# Patient Record
Sex: Female | Born: 1970
Health system: Southern US, Community
[De-identification: ages and names within clinical notes are randomized; demographics above are authoritative.]

## PROBLEM LIST (undated history)

## (undated) DIAGNOSIS — Z800 Family history of malignant neoplasm of digestive organs: Secondary | ICD-10-CM

## (undated) DIAGNOSIS — C801 Malignant (primary) neoplasm, unspecified: Secondary | ICD-10-CM

## (undated) DIAGNOSIS — F419 Anxiety disorder, unspecified: Secondary | ICD-10-CM

## (undated) DIAGNOSIS — E669 Obesity, unspecified: Secondary | ICD-10-CM

## (undated) HISTORY — DX: Obesity, unspecified: E66.9

## (undated) HISTORY — DX: Anxiety disorder, unspecified: F41.9

## (undated) HISTORY — PX: WISDOM TOOTH EXTRACTION: SHX21

## (undated) HISTORY — DX: Family history of malignant neoplasm of digestive organs: Z80.0

---

## 1998-04-30 ENCOUNTER — Other Ambulatory Visit: Admission: RE | Admit: 1998-04-30 | Discharge: 1998-04-30 | Payer: Self-pay | Admitting: Obstetrics & Gynecology

## 1998-12-24 ENCOUNTER — Encounter: Payer: Self-pay | Admitting: *Deleted

## 1998-12-24 ENCOUNTER — Ambulatory Visit (HOSPITAL_COMMUNITY): Admission: RE | Admit: 1998-12-24 | Discharge: 1998-12-24 | Payer: Self-pay | Admitting: *Deleted

## 1999-12-31 ENCOUNTER — Other Ambulatory Visit: Admission: RE | Admit: 1999-12-31 | Discharge: 1999-12-31 | Payer: Self-pay | Admitting: Obstetrics and Gynecology

## 2001-06-09 ENCOUNTER — Other Ambulatory Visit: Admission: RE | Admit: 2001-06-09 | Discharge: 2001-06-09 | Payer: Self-pay | Admitting: Obstetrics and Gynecology

## 2001-11-18 ENCOUNTER — Inpatient Hospital Stay (HOSPITAL_COMMUNITY): Admission: AD | Admit: 2001-11-18 | Discharge: 2001-11-18 | Payer: Self-pay | Admitting: Obstetrics and Gynecology

## 2001-11-20 ENCOUNTER — Inpatient Hospital Stay (HOSPITAL_COMMUNITY): Admission: AD | Admit: 2001-11-20 | Discharge: 2001-11-22 | Payer: Self-pay | Admitting: Obstetrics and Gynecology

## 2001-11-20 ENCOUNTER — Encounter: Payer: Self-pay | Admitting: Obstetrics and Gynecology

## 2001-12-07 ENCOUNTER — Inpatient Hospital Stay (HOSPITAL_COMMUNITY): Admission: AD | Admit: 2001-12-07 | Discharge: 2001-12-09 | Payer: Self-pay | Admitting: Obstetrics and Gynecology

## 2002-07-05 ENCOUNTER — Other Ambulatory Visit: Admission: RE | Admit: 2002-07-05 | Discharge: 2002-07-05 | Payer: Self-pay | Admitting: Obstetrics and Gynecology

## 2004-12-03 ENCOUNTER — Emergency Department (HOSPITAL_COMMUNITY): Admission: EM | Admit: 2004-12-03 | Discharge: 2004-12-03 | Payer: Self-pay | Admitting: Emergency Medicine

## 2005-03-25 ENCOUNTER — Other Ambulatory Visit: Admission: RE | Admit: 2005-03-25 | Discharge: 2005-03-25 | Payer: Self-pay | Admitting: Obstetrics and Gynecology

## 2005-04-29 ENCOUNTER — Inpatient Hospital Stay (HOSPITAL_COMMUNITY): Admission: AD | Admit: 2005-04-29 | Discharge: 2005-04-29 | Payer: Self-pay | Admitting: Obstetrics and Gynecology

## 2005-10-07 ENCOUNTER — Inpatient Hospital Stay (HOSPITAL_COMMUNITY): Admission: AD | Admit: 2005-10-07 | Discharge: 2005-10-07 | Payer: Self-pay | Admitting: Obstetrics and Gynecology

## 2005-10-12 ENCOUNTER — Inpatient Hospital Stay (HOSPITAL_COMMUNITY): Admission: AD | Admit: 2005-10-12 | Discharge: 2005-10-15 | Payer: Self-pay | Admitting: Obstetrics and Gynecology

## 2008-03-24 HISTORY — PX: CERVICAL POLYPECTOMY: SHX88

## 2009-01-17 HISTORY — PX: BARTHOLIN GLAND CYST EXCISION: SHX565

## 2009-03-14 ENCOUNTER — Encounter: Admission: RE | Admit: 2009-03-14 | Discharge: 2009-03-14 | Payer: Self-pay | Admitting: Obstetrics and Gynecology

## 2010-08-09 NOTE — H&P (Signed)
Miranda Howe, BETSILL                        ACCOUNT NO.:  [id number]   MEDICAL RECORD NO.:  [id number]                   PATIENT TYPE:  MAT   LOCATION:  [facility]                                 F[facility]ILITY:  [facility]   PHYSICIAN:  Janine Limbo, M.D.            DATE OF BIRTH:  May 11, 1970   DATE OF ADMISSION:  12/07/2001  DATE OF DISCHARGE:                                HISTORY & PHYSICAL   HISTORY OF PRESENT ILLNESS:  The patient is a 40 year old gravida 1, para 0  at 63 and 5/7 weeks who presented with uterine contractions every two  minutes for several hours.  She reports positive bloody show and positive  fetal movement.  She denies leaking of fluid.  Pregnancy has been remarkable  for pleurisy versus left lower lobe pneumonia approximately three weeks ago  requiring hospitalization, UTI in October 2002.   PRENATAL LABORATORIES:  Blood type O+.  Rh antibody negative.  VDRL  nonreactive.  Rubella titer positive.  Hepatitis B surface antigen negative.  HIV nonreactive.  GC and Chlamydia cultures were negative.  Pap was normal  except for yeast.  Glucose challenge was normal.  AFP was normal.  Hemoglobin upon entry into practice was 12.3.  It was 10.6 at 28 weeks.  EDC  of December 16, 2001 was established by last menstrual period and was in  agreement with ultrasound at approximately 18 weeks.  Group B Strep culture  was negative at 36 weeks.   HISTORY OF PRESENT PREGNANCY:  The patient entered care at approximately 13  weeks.  She had an AFP that was normal.  She had parvo titers done at  approximately 17-1/2 weeks secondary to a Fifth's disease exposure.  Parvo  titers were normal.  She had some cramping at 28 weeks.  She was seen in  maternity admissions at approximately 36-37 weeks with left chest pain.  It  was determined to be probably musculoskeletal in origin.  She had no fever  or any other complications.  Approximately two days later she presented with  worsening chest  pain and was diagnosed with pleurisy versus left lower lobe  pneumonia.  She was hospitalized for approximately two days.  Was given  antibiotics and pain medication and this did resolve.  She has subsequently  been doing very well.   OBSTETRICAL HISTORY:  The patient is a primigravida.   PAST MEDICAL HISTORY:  She had a UTI in October 2001 with E. coli.  This was  treated.  She has a history of fibrocystic breast changes, but had a normal  Mammogram in October 2002 and a normal ultrasound.   ALLERGIES:  SULFA which causes a rash.   FAMILY HISTORY:  Her father has heart disease.  Her great uncle has  emphysema.   GENETIC HISTORY:  Unremarkable.   SOCIAL HISTORY:  The patient is married to the father of the baby.  He is  involved and supportive.  His name is Ladana Chavero.  The patient is graduate  educated.  She is employed as an Tax inspector.  Her husband is also  college educated.  He is employed as a Advice worker.  She has been  followed by the certified nurse midwife service at [facility].  She  denies any alcohol, drug, or tobacco use during this pregnancy.   PHYSICAL EXAMINATION:  VITAL SIGNS:  Stable.  The patient is afebrile.  HEENT:  Within normal limits.  LUNGS:  Bilateral breath sounds are clear.  HEART:  Regular rate and rhythm without murmur.  BREASTS:  Soft and nontender.  ABDOMEN:  Fundal height is approximately 39 cm.  Estimated fetal weight is 7-  7.5 pounds.  Uterine contractions are every two to three minutes, moderate  to strong quality.  PELVIC:  Cervical examination:  5 cm, 80%, vertex at a 0 station with  bulging bag of water.  Fetal heart rate is reactive with no decelerations.  EXTREMITIES:  Deep tendon reflexes are 2+ without clonus.  There is a trace  edema noted.   IMPRESSION:  1. Intrauterine pregnancy at term.  2. Active labor.  3. Negative group B Strep.   PLAN:  1. Admit to birthing suite for consult with Dr. Marline Backbone as attending     physician.  2. Routine certified nurse midwife orders.  3. The patient plans possible IV pain medication with epidural p.r.n.     Renaldo Reel Emilee Hero, C.N.M.                   Janine Limbo, M.D.    VLL/MEDQ  D:  12/07/2001  T:  12/07/2001  Job:  [id number]

## 2010-08-09 NOTE — H&P (Signed)
NAMEQUIN, MCPHERSON            ACCOUNT NO.:  [id number]   MEDICAL RECORD NO.:  [id number]          PATIENT TYPE:  INP   LOCATION:  9169                          [facility]ILITY:  [facility]   PHYSICIAN:  Osborn Coho, M.D.   DATE OF BIRTH:  [birth year]   DATE OF ADMISSION:  10/12/2005  DATE OF DISCHARGE:                                HISTORY & PHYSICAL   HISTORY OF PRESENT ILLNESS:  Ms. Delira is a gravida 3, para 1-0-1-1 who  presents at [redacted] weeks gestation, EDD October 12, 2005 by 11-week ultrasound.  She presents for induction of labor as a multiparous with a favorable  cervix.  She reports positive fetal movement, no vaginal bleeding, no  rupture of membranes and is having uncomfortable irregular contractions.  She denies any headache, visual changes or epigastric pain.  Her pregnancy  has been followed by the C.N.M. service at [facility] and is remarkable for (1)  advanced maternal age, (2) increase Down syndrome risk on quad screen of 1  in 45, adjusted risk after ultrasound 1 in 198, the patient declined amnio.  Her pregnancy has been essentially unremarkable.  She has been size equal to  dates throughout, normotensive with no proteinuria.  Her initial prenatal  weight is 142, final prenatal weight 177 for a weight gain of 34 pounds.   PRENATAL LABORATORY WORK:  Hemoglobin and hematocrit 12.1 and 35.2,  platelets 223,000, blood type and Rh O positive, antibody screen negative,  VDRL nonreactive, rubella immune, hepatitis B surface antigen negative.  Pap  smear within normal limits.  GC and chlamydia negative.  CF testing  negative.  Quad screen shows 1 in 79 Down syndrome risk, adjusted Down  syndrome risk after ultrasound 1 in 198.  Amniocentesis discussed with the  patient, she declined.  At 28 weeks 1-hour glucose challenge within normal  limits.  Hemoglobin 10.8.  At 36 weeks culture of the vaginal tract is  negative for group B strep.   OBSTETRICAL HISTORY:  In 2003 the patient had a  normal spontaneous vaginal  delivery at term with the birth of a 6 pound 13 ounce female infant named  Elle.  In 2005 the patient had a first trimester SAB with no D&C and no  complications.  This is her third and current pregnancy.   MEDICAL HISTORY:  During her first pregnancy she did have pleurisy and was  hospitalized for antibiotics and had a spiral CT at that time.  This  pregnancy has been unremarkable.   FAMILY HISTORY:  The patient's mother with a history of chronic hypertension  and varicose veins.  The patient's father has prostate cancer.   GENETIC HISTORY:  The patient is age 39.  Otherwise, there is no family  history of familial or chromosomal disorders, children that were born with  birth defects or any that died in infancy.   ALLERGIES:  THE PATIENT HAS A MEDICATION ALLERGY TO SULFA.   HABITS:  She denies the use of tobacco, alcohol or illicit drugs.   SOCIAL HISTORY:  Ms. Skalsky is a 40 year old Caucasian married female.  Her  husband Alexzia Kasler  is involved and supportive.  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  They are Catholic  in their faith.   REVIEW OF SYSTEMS:  The patient is typical of one with a uterine pregnancy  at term.  She presents for induction of labor.   PHYSICAL EXAMINATION:  VITAL SIGNS:  Stable.  The patient is afebrile.  HEENT:  Unremarkable.  HEART:  Regular rate and rhythm.  CHEST:  Lungs are clear.  ABDOMEN:  Gravid in its contour.  Uterine fundus is noted to extend 39 cm  above the level of the pubic symphysis.  Leopold's maneuvers finds the  infant to be in a longitudinal lie, cephalic presentation and the estimated  fetal weight is 7 to 7-1/2 pounds.  Her abdomen is soft and nontender.  The  baseline of the fetal heart rate monitor is 140s with average long-term  variability, reactivity is present with no periodic changes.  The patient is  contracting mildly and irregularly.  CERVIX:   Digital exam of the cervix finds it to be 2 cm dilated, 70% effaced  with the cephalic presenting part at -2 station and membranes intact.  EXTREMITIES:  Extremities show no pathologic edema.  DTRs are 1+ with no  clonus.   ASSESSMENT:  1.  Intrauterine pregnancy at term.  2.  Induction of labor.   PLAN:  Admit per Dr. Osborn Coho.  Routine C.N.M. orders.  Philipp Deputy to  come and evaluate the patient and make plans for induction.  See orders as  written.      Rica Koyanagi, C.N.M.      Osborn Coho, M.D.  Electronically Signed    SDM/MEDQ  D:  10/12/2005  T:  10/13/2005  Job:  [id number]

## 2010-08-09 NOTE — Discharge Summary (Signed)
Miranda Howe, Miranda Howe                        ACCOUNT NO.:  [id number]   MEDICAL RECORD NO.:  [id number]                   PATIENT TYPE:  INP   LOCATION:  9179                                 [facility]ACILITY[facility]   PHYSICIAN:  Crist Fat. Rivard, M.D.              DATE OF BIRTH:  1971-03-13   DATE OF ADMISSION:  11/20/2001  DATE OF DISCHARGE:  11/22/2001                                 DISCHARGE SUMMARY   ADMISSION DIAGNOSES:  1. Intrauterine pregnancy at 33 and three-sevenths weeks.  2. Rib cage pain.  3. Questionable pneumonia versus allergic alveolitis.   DISCHARGE DIAGNOSES:  1. Intrauterine pregnancy 36 and five-sevenths weeks.  2. Left lower lobe pneumonia, improving.  3. Gastroesophageal reflux disease.   PROCEDURES:  1. Antibiotic therapy.  2. Spiral CT.   HOSPITAL COURSE:  The patient is a 40 year old gravida 1 para 0 at 22 and  three-sevenths weeks who presented on the morning of November 20, 2001 with  worsening left rib cage pain.  She was evaluated in maternity admissions  Thursday evening for same pain, was determined to probably have  musculoskeletal issues status post a persistent nonproductive cough, and  reflux.  She was prescribed Darvocet and Tussionex.  She did have some  benefit to these, but then pain began to increase Thursday p.m. into Friday  morning.  She was afebrile on presentation, her vital signs were stable.  She was evaluated with chest x-ray, CBC, lipase and amylase.  The chest x-  ray showed bibasilar subsegmental atelectasis.  She then had a spiral CT to  rule out PE.  The spiral CT was negative although there was some  questionable pneumonia versus allergic alveolitis.  Labs were within normal  limits.  She was admitted for presumed pneumonia versus costochondritis.  She was prescribed Zithromax, Rocephin, and codeine  NST was reactive with  negative CST.  She began to improve on antibiotics and pain management.  Amylase and lipase showed improved  and PPD was negative.  By September 1,  hospital day #2, the patient was doing well.  WBC count was down to 8.4, O2  saturation was 97% on room air.  Her pain was also much improved.  She was  having good fetal activity.  She was seen by Dr. Estanislado Pandy and the decision was  made to discharge her home secondary to left lower lobe pneumonia improving  and stable gastroesophageal reflux disease.   DISCHARGE MEDICATIONS:  1. Zithromax 250 mg for seven days.  2. Tussionex one teaspoon q.12h. p.r.n. cough.  3. Codeine 30 mg one to two p.o. q.3-4h p.r.n. pain.  4. Zantac 150 mg b.i.d. p.r.n.    DISCHARGE INSTRUCTIONS:  Discharge follow-up will occur as scheduled in the  office on November 25, 2001 with the patient having been given permission to  resume work four hours per day.  Precautions were reviewed with the patient.  She will call with  any worsening of pain, any onset of fever, any other  obstetrical issues, or shortness of breath.      Renaldo Reel Emilee Hero, C.N.M.                   Crist Fat Rivard, M.D.    Leeanne Mannan  D:  11/22/2001  T:  11/22/2001  Job:  [id number]

## 2010-08-09 NOTE — Discharge Summary (Signed)
NAMESHANTASIA, HUNNELL                        ACCOUNT NO.:  [id number]   MEDICAL RECORD NO.:  [id number]                   PATIENT TYPE:   LOCATION:                                       FACILITY:   PHYSICIAN:  Naima A. Dillard, M.D.              DATE OF BIRTH:  03-Apr-1970   DATE OF ADMISSION:  DATE OF DISCHARGE:                                 DISCHARGE SUMMARY   HISTORY OF PRESENT ILLNESS:  Patient is a 40 -year-old white female G1 P0 at  36-3/7 weeks, who came in this morning complaining of left rib cage pain  since her evaluation Thursday evening.  She was seen on Thursday evening for  a chronic nonproductive cough and rib cage pain.  She was given Darvocet and  Tussionex.  She awoke this morning with increased pain at that time.  She  has had a dry kind of productive cough for the last month.  She did have  some production about a month ago but this was clear sputum.  She has gotten  no relief with Robitussin either.  She denies any fevers or chills or nasal  congestion.  She denies having any shortness of breath, headaches, nausea or  vomiting, dizziness or syncope.  She denies any physical abuse.  Patient  does report having some heartburn and reflux for the last couple of months  which is awakens her at night.  She feels this in her throat which she  believes starts the coughing spell.  She just started Zantac yesterday.   Patient's chest x-ray is significant for bibasilar atelectasis today.  She  had an ABG with pH of 7.4, PO2 of 90, CO2 of 30 and pulse oximetry 97% on  room air.  Patient was then sent to rule out PE with spinal CT secondary to  her not getting any relief and pain.  The spinal CT was negative for  pulmonary embolus; however, this was significant for subtle ground glass  appearance in the lingular and left upper lobes which would represent  infection versus extrinsic allergic alveolitis.   PRENATAL CARE:  Patient does have good fetal movement.  No leakage  of fluid.  No vaginal bleeding.  Patient does not feel her contractions.  Her prenatal  care started at [facility] at 13 weeks'.  Her prenatal laboratories are O+ and  antibody negative.  Hemoglobin 12.8, RPR nonreactive, rubella immune, HIV is  nonreactive, one-hour Glucola was 64, AFP within normal limits, Pap was  normal.  GC and Chlamydia both negative.  GBS negative.   OBSTETRICAL HISTORY:  Unremarkable.   GYN HISTORY:  Significant for menarche at age 19 occurring every 28 days  lasting 5-7 days.  No history of any sexually transmitted diseases.   PAST MEDICAL HISTORY:  Patient denies any surgical or past medical  significant history.   ALLERGIES:  She is allergic to SULFA which causes a rash.  FAMILY HISTORY:  Significant for father with heart disease and great aunt  with emphysema.   SOCIAL HISTORY:  Negative x3.   REVIEW OF SYSTEMS:  CARDIOVASCULAR:  Unremarkable.  HEENT:  Unremarkable.  RESPIRATORY:  As above.  GENITOURINARY:  As above.  MUSCULOSKELETAL:  Unremarkable.   PHYSICAL EXAMINATION:  GENERAL:  Temperature 99.4, blood pressure 125/78,  pulse 96, respiratory rate 24.  Fetal heart tones 140's - 150's with some  acceleration.  Nonreactive.  She is contracting about every two to three  minutes.  She is in mild distress secondary to her left chest wall pain.  HEART:  Regular rate and rhythm.  LUNGS:  Clear to auscultation on the right.  She has some crackles in the  left base.  ABDOMEN:  Abdomen is soft, nontender, gravid.  She has some left chest wall  tenderness.  No CVA tenderness bilaterally.  EXTREMITIES:  No cyanosis, clubbing or edema.   LABORATORY DATA:  White blood count is 15.2 with left shift 84.7 segs, no  bandemia.  Amylase and lipase are pending.  Chest x-ray, ABG and CT are as  above.  Patient did have a UA when she was here before which was found to be  negative.   ASSESSMENT AND PLAN:  Patient is a [redacted] weeks pregnant woman with presumed  pneumonia  and costochondritis.  She will be admitted to OB-GYN service.  Patient will be started on Rocephin and Zithromax for antibiotics.  We will  encourage incentive spirometer.  She will be given codeine to suppress her  cough and for pain as needed.  She will also be given Tussionex as needed.  This could also be a result of an aspiration pneumonia due to her reflux so  she will be started on Zantac 150 mg p.o. b.i.d.  She will get small meals.  Patient given aspiration precautions and not allowed to lie down 30 minutes  after each meal.  Her NST is nonreactive, however, is reassuring with  negative CST.  We will continue continuous fetal monitoring.  She will have  pulse oximetry every shift.  If patient feels no improvement in the next 24-  48 hours, will obtain pulmonologist's consultation.  The patient is to be  monitored closely.                                                Naima A. Normand Sloop, M.D.    NAD/MEDQ  D:  11/20/2001  T:  11/20/2001  Job:  [id number]

## 2012-05-03 ENCOUNTER — Other Ambulatory Visit: Payer: Self-pay | Admitting: Obstetrics and Gynecology

## 2012-05-03 DIAGNOSIS — Z1231 Encounter for screening mammogram for malignant neoplasm of breast: Secondary | ICD-10-CM

## 2012-05-17 ENCOUNTER — Encounter: Payer: Self-pay | Admitting: Obstetrics and Gynecology

## 2012-05-17 ENCOUNTER — Ambulatory Visit: Payer: 59 | Admitting: Obstetrics and Gynecology

## 2012-05-17 VITALS — BP 100/62 | Temp 98.3°F | Ht 63.0 in | Wt 180.0 lb

## 2012-05-17 DIAGNOSIS — Z124 Encounter for screening for malignant neoplasm of cervix: Secondary | ICD-10-CM

## 2012-05-17 NOTE — Progress Notes (Signed)
Subjective:    Miranda Howe is a 42 y.o. female, [id number], who presents for an annual exam. The patient reports no complaints.  Menstrual cycle:   LMP: Patient's last menstrual period was 05/03/2012.             Review of Systems Pertinent items are noted in HPI. Denies pelvic pain, urinary tract symptoms, vaginitis symptoms, irregular bleeding, menopausal symptoms, change in bowel habits or rectal bleeding   Objective:    BP 100/62  Temp(Src) 98.3 F (36.8 C) (Oral)  Ht 5\' 3"  (1.6 m)  Wt 180 lb (81.647 kg)  BMI 31.89 kg/m2  LMP 05/03/2012   Wt Readings from Last 1 Encounters:  05/17/12 180 lb (81.647 kg)   Body mass index is 31.89 kg/(m^2). General Appearance: Alert, no acute distress HEENT: Grossly normal Neck / Thyroid: Supple, no thyromegaly or cervical adenopathy Lungs: Clear to auscultation bilaterally Back: No CVA tenderness Breast Exam: No masses or nodes.No dimpling, nipple retraction or discharge. Cardiovascular: Regular rate and rhythm.  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

## 2012-05-17 NOTE — Progress Notes (Addendum)
Regular Periods: yes Mammogram: yes  Monthly Breast Ex.: yes Exercise: yes  Tetanus < 10 years: yes Seatbelts: yes  NI. Bladder Functn.: yes Abuse at home: no  Daily BM's: yes Stressful Work: yes  Healthy Diet: yes Sigmoid-Colonoscopy: no  Calcium: no Medical problems this year: none   LAST PAP:3 years ago  Contraception: condoms and gel  Mammogram:  2 years. Pt schedule mammo in 3/14  PCP: Dr. Hyacinth Meeker  PMH: no change  FMH: no change  Last Bone Scan: no  Pt is married.

## 2012-05-19 ENCOUNTER — Encounter: Payer: Self-pay | Admitting: Obstetrics and Gynecology

## 2012-05-19 LAB — PAP IG W/ RFLX HPV ASCU

## 2012-05-31 ENCOUNTER — Ambulatory Visit
Admission: RE | Admit: 2012-05-31 | Discharge: 2012-05-31 | Disposition: A | Discharge status: Home / Self Care | Payer: 59 | Source: Ambulatory Visit | Attending: Obstetrics and Gynecology | Admitting: Obstetrics and Gynecology

## 2013-12-23 ENCOUNTER — Other Ambulatory Visit: Payer: Self-pay

## 2013-12-23 DIAGNOSIS — Z1231 Encounter for screening mammogram for malignant neoplasm of breast: Secondary | ICD-10-CM

## 2014-01-04 ENCOUNTER — Ambulatory Visit
Admission: RE | Admit: 2014-01-04 | Discharge: 2014-01-04 | Disposition: A | Discharge status: Home / Self Care | Payer: 59 | Source: Ambulatory Visit

## 2014-01-04 DIAGNOSIS — Z1231 Encounter for screening mammogram for malignant neoplasm of breast: Secondary | ICD-10-CM

## 2014-01-23 ENCOUNTER — Encounter: Payer: Self-pay | Admitting: Obstetrics and Gynecology

## 2014-06-06 ENCOUNTER — Emergency Department (HOSPITAL_COMMUNITY): Payer: 59

## 2014-06-06 ENCOUNTER — Inpatient Hospital Stay (HOSPITAL_COMMUNITY)
Admission: EM | Admit: 2014-06-06 | Discharge: 2014-06-15 | DRG: 853 | Disposition: A | Discharge status: Home / Self Care | Payer: 59 | Attending: Surgery | Admitting: Surgery

## 2014-06-06 ENCOUNTER — Encounter (HOSPITAL_COMMUNITY): Payer: Self-pay | Admitting: *Deleted

## 2014-06-06 DIAGNOSIS — R6521 Severe sepsis with septic shock: Secondary | ICD-10-CM | POA: Diagnosis present

## 2014-06-06 DIAGNOSIS — R001 Bradycardia, unspecified: Secondary | ICD-10-CM | POA: Diagnosis present

## 2014-06-06 DIAGNOSIS — A419 Sepsis, unspecified organism: Secondary | ICD-10-CM | POA: Diagnosis not present

## 2014-06-06 DIAGNOSIS — R52 Pain, unspecified: Secondary | ICD-10-CM

## 2014-06-06 DIAGNOSIS — D62 Acute posthemorrhagic anemia: Secondary | ICD-10-CM | POA: Diagnosis not present

## 2014-06-06 DIAGNOSIS — J811 Chronic pulmonary edema: Secondary | ICD-10-CM | POA: Diagnosis present

## 2014-06-06 DIAGNOSIS — Z79899 Other long term (current) drug therapy: Secondary | ICD-10-CM

## 2014-06-06 DIAGNOSIS — B372 Candidiasis of skin and nail: Secondary | ICD-10-CM | POA: Diagnosis present

## 2014-06-06 DIAGNOSIS — F419 Anxiety disorder, unspecified: Secondary | ICD-10-CM | POA: Diagnosis present

## 2014-06-06 DIAGNOSIS — Z833 Family history of diabetes mellitus: Secondary | ICD-10-CM

## 2014-06-06 DIAGNOSIS — Z8249 Family history of ischemic heart disease and other diseases of the circulatory system: Secondary | ICD-10-CM

## 2014-06-06 DIAGNOSIS — E860 Dehydration: Secondary | ICD-10-CM | POA: Diagnosis present

## 2014-06-06 DIAGNOSIS — R079 Chest pain, unspecified: Secondary | ICD-10-CM | POA: Diagnosis present

## 2014-06-06 DIAGNOSIS — J9690 Respiratory failure, unspecified, unspecified whether with hypoxia or hypercapnia: Secondary | ICD-10-CM

## 2014-06-06 DIAGNOSIS — Z791 Long term (current) use of non-steroidal anti-inflammatories (NSAID): Secondary | ICD-10-CM

## 2014-06-06 DIAGNOSIS — I9581 Postprocedural hypotension: Secondary | ICD-10-CM | POA: Diagnosis not present

## 2014-06-06 DIAGNOSIS — Z882 Allergy status to sulfonamides status: Secondary | ICD-10-CM

## 2014-06-06 DIAGNOSIS — J9811 Atelectasis: Secondary | ICD-10-CM | POA: Diagnosis present

## 2014-06-06 DIAGNOSIS — Z803 Family history of malignant neoplasm of breast: Secondary | ICD-10-CM

## 2014-06-06 DIAGNOSIS — E0781 Sick-euthyroid syndrome: Secondary | ICD-10-CM | POA: Diagnosis present

## 2014-06-06 DIAGNOSIS — Y838 Other surgical procedures as the cause of abnormal reaction of the patient, or of later complication, without mention of misadventure at the time of the procedure: Secondary | ICD-10-CM | POA: Diagnosis not present

## 2014-06-06 DIAGNOSIS — K567 Ileus, unspecified: Secondary | ICD-10-CM | POA: Diagnosis not present

## 2014-06-06 DIAGNOSIS — I959 Hypotension, unspecified: Secondary | ICD-10-CM | POA: Diagnosis present

## 2014-06-06 DIAGNOSIS — R1013 Epigastric pain: Secondary | ICD-10-CM | POA: Diagnosis not present

## 2014-06-06 DIAGNOSIS — J9589 Other postprocedural complications and disorders of respiratory system, not elsewhere classified: Secondary | ICD-10-CM | POA: Diagnosis not present

## 2014-06-06 DIAGNOSIS — C49A3 Gastrointestinal stromal tumor of small intestine: Secondary | ICD-10-CM | POA: Diagnosis present

## 2014-06-06 DIAGNOSIS — E039 Hypothyroidism, unspecified: Secondary | ICD-10-CM | POA: Diagnosis present

## 2014-06-06 LAB — I-STAT TROPONIN, ED: Troponin i, poc: 0 ng/mL (ref 0.00–0.08)

## 2014-06-06 NOTE — ED Notes (Signed)
Pt states that she began to have chest pain approx 2 hrs; pt describes the pain as pressure radiating to her back; pt with N/V; shortness of breath and diaphoresis; pt hyperventilating and is complaining of arm tingling and leg weakness

## 2014-06-07 ENCOUNTER — Inpatient Hospital Stay (HOSPITAL_COMMUNITY): Payer: 59 | Admitting: Anesthesiology

## 2014-06-07 ENCOUNTER — Encounter (HOSPITAL_COMMUNITY): Admission: EM | Disposition: A | Discharge status: Home / Self Care | Payer: Self-pay | Source: Home / Self Care

## 2014-06-07 ENCOUNTER — Emergency Department (HOSPITAL_COMMUNITY): Payer: 59

## 2014-06-07 ENCOUNTER — Ambulatory Visit: Admit: 2014-06-07 | Payer: Self-pay | Admitting: General Surgery

## 2014-06-07 ENCOUNTER — Inpatient Hospital Stay (HOSPITAL_COMMUNITY): Payer: 59

## 2014-06-07 ENCOUNTER — Encounter (HOSPITAL_COMMUNITY): Payer: Self-pay | Admitting: Emergency Medicine

## 2014-06-07 DIAGNOSIS — I9581 Postprocedural hypotension: Secondary | ICD-10-CM | POA: Diagnosis not present

## 2014-06-07 DIAGNOSIS — F419 Anxiety disorder, unspecified: Secondary | ICD-10-CM | POA: Diagnosis present

## 2014-06-07 DIAGNOSIS — I319 Disease of pericardium, unspecified: Secondary | ICD-10-CM | POA: Diagnosis not present

## 2014-06-07 DIAGNOSIS — R001 Bradycardia, unspecified: Secondary | ICD-10-CM | POA: Diagnosis present

## 2014-06-07 DIAGNOSIS — R6521 Severe sepsis with septic shock: Secondary | ICD-10-CM | POA: Diagnosis present

## 2014-06-07 DIAGNOSIS — J9589 Other postprocedural complications and disorders of respiratory system, not elsewhere classified: Secondary | ICD-10-CM | POA: Diagnosis not present

## 2014-06-07 DIAGNOSIS — R1904 Left lower quadrant abdominal swelling, mass and lump: Secondary | ICD-10-CM | POA: Diagnosis not present

## 2014-06-07 DIAGNOSIS — C179 Malignant neoplasm of small intestine, unspecified: Secondary | ICD-10-CM | POA: Diagnosis not present

## 2014-06-07 DIAGNOSIS — E0781 Sick-euthyroid syndrome: Secondary | ICD-10-CM | POA: Diagnosis present

## 2014-06-07 DIAGNOSIS — Y838 Other surgical procedures as the cause of abnormal reaction of the patient, or of later complication, without mention of misadventure at the time of the procedure: Secondary | ICD-10-CM | POA: Diagnosis not present

## 2014-06-07 DIAGNOSIS — Z791 Long term (current) use of non-steroidal anti-inflammatories (NSAID): Secondary | ICD-10-CM | POA: Diagnosis not present

## 2014-06-07 DIAGNOSIS — B372 Candidiasis of skin and nail: Secondary | ICD-10-CM | POA: Diagnosis present

## 2014-06-07 DIAGNOSIS — J811 Chronic pulmonary edema: Secondary | ICD-10-CM | POA: Diagnosis present

## 2014-06-07 DIAGNOSIS — E860 Dehydration: Secondary | ICD-10-CM | POA: Diagnosis present

## 2014-06-07 DIAGNOSIS — Z882 Allergy status to sulfonamides status: Secondary | ICD-10-CM | POA: Diagnosis not present

## 2014-06-07 DIAGNOSIS — I9589 Other hypotension: Secondary | ICD-10-CM | POA: Diagnosis not present

## 2014-06-07 DIAGNOSIS — Z79899 Other long term (current) drug therapy: Secondary | ICD-10-CM | POA: Diagnosis not present

## 2014-06-07 DIAGNOSIS — D62 Acute posthemorrhagic anemia: Secondary | ICD-10-CM | POA: Diagnosis not present

## 2014-06-07 DIAGNOSIS — E039 Hypothyroidism, unspecified: Secondary | ICD-10-CM | POA: Diagnosis present

## 2014-06-07 DIAGNOSIS — R1013 Epigastric pain: Secondary | ICD-10-CM | POA: Diagnosis present

## 2014-06-07 DIAGNOSIS — Z803 Family history of malignant neoplasm of breast: Secondary | ICD-10-CM | POA: Diagnosis not present

## 2014-06-07 DIAGNOSIS — Z833 Family history of diabetes mellitus: Secondary | ICD-10-CM | POA: Diagnosis not present

## 2014-06-07 DIAGNOSIS — K6389 Other specified diseases of intestine: Secondary | ICD-10-CM | POA: Diagnosis not present

## 2014-06-07 DIAGNOSIS — A419 Sepsis, unspecified organism: Secondary | ICD-10-CM | POA: Diagnosis present

## 2014-06-07 DIAGNOSIS — Z8249 Family history of ischemic heart disease and other diseases of the circulatory system: Secondary | ICD-10-CM | POA: Diagnosis not present

## 2014-06-07 DIAGNOSIS — K567 Ileus, unspecified: Secondary | ICD-10-CM | POA: Diagnosis not present

## 2014-06-07 DIAGNOSIS — R079 Chest pain, unspecified: Secondary | ICD-10-CM | POA: Diagnosis present

## 2014-06-07 DIAGNOSIS — J9811 Atelectasis: Secondary | ICD-10-CM | POA: Diagnosis not present

## 2014-06-07 HISTORY — PX: LAPAROTOMY: SHX154

## 2014-06-07 LAB — I-STAT CHEM 8, ED
BUN: 21 mg/dL (ref 6–23)
CALCIUM ION: 1.2 mmol/L (ref 1.12–1.23)
CREATININE: 0.9 mg/dL (ref 0.50–1.10)
Chloride: 102 mmol/L (ref 96–112)
Glucose, Bld: 106 mg/dL — ABNORMAL HIGH (ref 70–99)
HCT: 38 % (ref 36.0–46.0)
Hemoglobin: 12.9 g/dL (ref 12.0–15.0)
Potassium: 3.9 mmol/L (ref 3.5–5.1)
SODIUM: 140 mmol/L (ref 135–145)
TCO2: 20 mmol/L (ref 0–100)

## 2014-06-07 LAB — MRSA PCR SCREENING: MRSA BY PCR: NEGATIVE

## 2014-06-07 LAB — I-STAT TROPONIN, ED: TROPONIN I, POC: 0.03 ng/mL (ref 0.00–0.08)

## 2014-06-07 LAB — PROTIME-INR
INR: 1.06 (ref 0.00–1.49)
PROTHROMBIN TIME: 13.9 s (ref 11.6–15.2)

## 2014-06-07 LAB — CBC
HCT: 34.8 % — ABNORMAL LOW (ref 36.0–46.0)
Hemoglobin: 10.5 g/dL — ABNORMAL LOW (ref 12.0–15.0)
MCH: 22.6 pg — AB (ref 26.0–34.0)
MCHC: 30.2 g/dL (ref 30.0–36.0)
MCV: 75 fL — ABNORMAL LOW (ref 78.0–100.0)
PLATELETS: 334 10*3/uL (ref 150–400)
RBC: 4.64 MIL/uL (ref 3.87–5.11)
RDW: 16.4 % — ABNORMAL HIGH (ref 11.5–15.5)
WBC: 5.4 10*3/uL (ref 4.0–10.5)

## 2014-06-07 LAB — HEPATIC FUNCTION PANEL
ALT: 31 U/L (ref 0–35)
AST: 44 U/L — ABNORMAL HIGH (ref 0–37)
Albumin: 3.9 g/dL (ref 3.5–5.2)
Alkaline Phosphatase: 207 U/L — ABNORMAL HIGH (ref 39–117)
BILIRUBIN DIRECT: 0.2 mg/dL (ref 0.0–0.5)
BILIRUBIN INDIRECT: 0.4 mg/dL (ref 0.3–0.9)
Total Bilirubin: 0.6 mg/dL (ref 0.3–1.2)
Total Protein: 7.8 g/dL (ref 6.0–8.3)

## 2014-06-07 LAB — BASIC METABOLIC PANEL
Anion gap: 16 — ABNORMAL HIGH (ref 5–15)
BUN: 20 mg/dL (ref 6–23)
CALCIUM: 9.7 mg/dL (ref 8.4–10.5)
CO2: 21 mmol/L (ref 19–32)
Chloride: 102 mmol/L (ref 96–112)
Creatinine, Ser: 0.98 mg/dL (ref 0.50–1.10)
GFR calc Af Amer: 80 mL/min — ABNORMAL LOW (ref >90)
GFR, EST NON AFRICAN AMERICAN: 69 mL/min — AB (ref >90)
Glucose, Bld: 107 mg/dL — ABNORMAL HIGH (ref 70–99)
Potassium: 3.9 mmol/L (ref 3.5–5.1)
SODIUM: 139 mmol/L (ref 135–145)

## 2014-06-07 LAB — TYPE AND SCREEN
ABO/RH(D): O POS
ANTIBODY SCREEN: NEGATIVE

## 2014-06-07 LAB — I-STAT CG4 LACTIC ACID, ED
LACTIC ACID, VENOUS: 3.09 mmol/L — AB (ref 0.5–2.0)
Lactic Acid, Venous: 3.08 mmol/L (ref 0.5–2.0)

## 2014-06-07 LAB — ABO/RH: ABO/RH(D): O POS

## 2014-06-07 LAB — D-DIMER, QUANTITATIVE: D-Dimer, Quant: >20 ug/mL-FEU — ABNORMAL HIGH (ref 0.00–0.48)

## 2014-06-07 LAB — POC URINE PREG, ED: PREG TEST UR: NEGATIVE

## 2014-06-07 LAB — LIPASE, BLOOD: Lipase: 20 U/L (ref 11–59)

## 2014-06-07 LAB — POC OCCULT BLOOD, ED: Fecal Occult Bld: NEGATIVE

## 2014-06-07 SURGERY — LAPAROTOMY, EXPLORATORY
Anesthesia: General | Site: Abdomen

## 2014-06-07 MED ORDER — FENTANYL CITRATE 0.05 MG/ML IJ SOLN
50.0000 ug | Freq: Once | INTRAMUSCULAR | Status: AC
  Administered 2014-06-07: 50 ug via INTRAVENOUS
  Filled 2014-06-07: qty 2

## 2014-06-07 MED ORDER — LACTATED RINGERS IV SOLN
INTRAVENOUS | Status: DC | PRN
  Administered 2014-06-07 (×3): via INTRAVENOUS

## 2014-06-07 MED ORDER — LIDOCAINE HCL 2 % EX GEL
CUTANEOUS | Status: AC
  Filled 2014-06-07: qty 15

## 2014-06-07 MED ORDER — HYDROMORPHONE HCL 1 MG/ML IJ SOLN
INTRAMUSCULAR | Status: AC
  Filled 2014-06-07: qty 1

## 2014-06-07 MED ORDER — MENTHOL 3 MG MT LOZG
1.0000 | LOZENGE | OROMUCOSAL | Status: DC | PRN
  Filled 2014-06-07: qty 9

## 2014-06-07 MED ORDER — PROMETHAZINE HCL 25 MG/ML IJ SOLN: 6.2500 mg | INTRAMUSCULAR | Status: DC | PRN

## 2014-06-07 MED ORDER — ALBUMIN HUMAN 5 % IV SOLN
INTRAVENOUS | Status: DC | PRN
  Administered 2014-06-07 (×2): via INTRAVENOUS

## 2014-06-07 MED ORDER — LIP MEDEX EX OINT
1.0000 "application " | TOPICAL_OINTMENT | Freq: Two times a day (BID) | CUTANEOUS | Status: DC
  Administered 2014-06-07 – 2014-06-15 (×16): 1 via TOPICAL
  Filled 2014-06-07 (×3): qty 7

## 2014-06-07 MED ORDER — MIDAZOLAM HCL 2 MG/2ML IJ SOLN
INTRAMUSCULAR | Status: AC
  Filled 2014-06-07: qty 2

## 2014-06-07 MED ORDER — SODIUM CHLORIDE 0.9 % IV BOLUS (SEPSIS): 2000.0000 mL | Freq: Once | INTRAVENOUS | Status: DC

## 2014-06-07 MED ORDER — LIDOCAINE HCL (CARDIAC) 20 MG/ML IV SOLN
INTRAVENOUS | Status: DC | PRN
Start: 2014-06-07 — End: 2014-06-07
  Administered 2014-06-07: 100 mg via INTRAVENOUS

## 2014-06-07 MED ORDER — ALBUMIN HUMAN 5 % IV SOLN
INTRAVENOUS | Status: AC
  Filled 2014-06-07: qty 250

## 2014-06-07 MED ORDER — LORAZEPAM 2 MG/ML IJ SOLN: 0.5000 mg | Freq: Once | INTRAMUSCULAR | Status: DC

## 2014-06-07 MED ORDER — PHENYLEPHRINE HCL 10 MG/ML IJ SOLN
INTRAMUSCULAR | Status: AC
  Filled 2014-06-07: qty 2

## 2014-06-07 MED ORDER — ONDANSETRON HCL 4 MG/2ML IJ SOLN
INTRAMUSCULAR | Status: DC | PRN
  Administered 2014-06-07: 4 mg via INTRAVENOUS

## 2014-06-07 MED ORDER — LACTATED RINGERS IV BOLUS (SEPSIS): 1000.0000 mL | Freq: Three times a day (TID) | INTRAVENOUS | Status: DC | PRN

## 2014-06-07 MED ORDER — SODIUM CHLORIDE 0.9 % IV BOLUS (SEPSIS)
1000.0000 mL | Freq: Once | INTRAVENOUS | Status: AC
Start: 2014-06-07 — End: 2014-06-07
  Administered 2014-06-07: 1000 mL via INTRAVENOUS

## 2014-06-07 MED ORDER — ONDANSETRON HCL 4 MG/2ML IJ SOLN
4.0000 mg | Freq: Four times a day (QID) | INTRAMUSCULAR | Status: DC | PRN
  Administered 2014-06-09 – 2014-06-12 (×7): 4 mg via INTRAVENOUS
  Filled 2014-06-07 (×9): qty 2

## 2014-06-07 MED ORDER — SODIUM CHLORIDE 0.9 % IV BOLUS (SEPSIS)
2000.0000 mL | Freq: Once | INTRAVENOUS | Status: AC
Start: 2014-06-07 — End: 2014-06-07
  Administered 2014-06-07: 2000 mL via INTRAVENOUS

## 2014-06-07 MED ORDER — KETOROLAC TROMETHAMINE 30 MG/ML IJ SOLN
30.0000 mg | Freq: Once | INTRAMUSCULAR | Status: DC
Start: 2014-06-07 — End: 2014-06-07

## 2014-06-07 MED ORDER — LACTATED RINGERS IV SOLN
INTRAVENOUS | Status: DC
  Administered 2014-06-07: 18:00:00 via INTRAVENOUS

## 2014-06-07 MED ORDER — LACTATED RINGERS IV SOLN
INTRAVENOUS | Status: AC
  Administered 2014-06-08 (×2): via INTRAVENOUS

## 2014-06-07 MED ORDER — DEXTROSE 5 % IV SOLN
2.0000 g | INTRAVENOUS | Status: DC
  Administered 2014-06-07: 2 g via INTRAVENOUS
  Filled 2014-06-07: qty 2

## 2014-06-07 MED ORDER — KETAMINE HCL 10 MG/ML IJ SOLN
INTRAMUSCULAR | Status: DC | PRN
  Administered 2014-06-07: 20 mg via INTRAVENOUS
  Administered 2014-06-07: 30 mg via INTRAVENOUS
  Administered 2014-06-07: 10 mg via INTRAVENOUS

## 2014-06-07 MED ORDER — FUROSEMIDE 10 MG/ML IJ SOLN
INTRAMUSCULAR | Status: AC
  Filled 2014-06-07: qty 4

## 2014-06-07 MED ORDER — SUCCINYLCHOLINE CHLORIDE 20 MG/ML IJ SOLN
INTRAMUSCULAR | Status: DC | PRN
  Administered 2014-06-07: 100 mg via INTRAVENOUS

## 2014-06-07 MED ORDER — ZOLPIDEM TARTRATE 5 MG PO TABS: 5.0000 mg | ORAL_TABLET | Freq: Every evening | ORAL | Status: DC | PRN

## 2014-06-07 MED ORDER — KETAMINE HCL 10 MG/ML IJ SOLN
INTRAMUSCULAR | Status: AC
  Filled 2014-06-07: qty 1

## 2014-06-07 MED ORDER — SODIUM CHLORIDE 0.9 % IV BOLUS (SEPSIS)
500.0000 mL | Freq: Once | INTRAVENOUS | Status: AC
  Administered 2014-06-07: 500 mL via INTRAVENOUS

## 2014-06-07 MED ORDER — PHENYLEPHRINE 40 MCG/ML (10ML) SYRINGE FOR IV PUSH (FOR BLOOD PRESSURE SUPPORT)
PREFILLED_SYRINGE | INTRAVENOUS | Status: AC
  Filled 2014-06-07: qty 10

## 2014-06-07 MED ORDER — HYDROMORPHONE HCL 1 MG/ML IJ SOLN
0.5000 mg | INTRAMUSCULAR | Status: DC | PRN
  Administered 2014-06-07 – 2014-06-08 (×4): 1 mg via INTRAVENOUS
  Filled 2014-06-07: qty 2
  Filled 2014-06-07: qty 1
  Filled 2014-06-07: qty 2
  Filled 2014-06-07 (×2): qty 1

## 2014-06-07 MED ORDER — ADULT MULTIVITAMIN W/MINERALS CH: 1.0000 | ORAL_TABLET | Freq: Every day | ORAL | Status: DC

## 2014-06-07 MED ORDER — ACETAMINOPHEN 650 MG RE SUPP: 650.0000 mg | Freq: Four times a day (QID) | RECTAL | Status: DC | PRN

## 2014-06-07 MED ORDER — HEPARIN SODIUM (PORCINE) 5000 UNIT/ML IJ SOLN
5000.0000 [IU] | Freq: Three times a day (TID) | INTRAMUSCULAR | Status: DC
  Administered 2014-06-08 – 2014-06-15 (×22): 5000 [IU] via SUBCUTANEOUS
  Filled 2014-06-07 (×26): qty 1

## 2014-06-07 MED ORDER — MEPERIDINE HCL 50 MG/ML IJ SOLN: 6.2500 mg | INTRAMUSCULAR | Status: DC | PRN

## 2014-06-07 MED ORDER — ONDANSETRON HCL 4 MG/2ML IJ SOLN
4.0000 mg | Freq: Once | INTRAMUSCULAR | Status: AC
  Administered 2014-06-07: 4 mg via INTRAVENOUS
  Filled 2014-06-07: qty 2

## 2014-06-07 MED ORDER — 0.9 % SODIUM CHLORIDE (POUR BTL) OPTIME
TOPICAL | Status: DC | PRN
Start: 2014-06-07 — End: 2014-06-07
  Administered 2014-06-07: 6000 mL

## 2014-06-07 MED ORDER — STERILE WATER FOR IRRIGATION IR SOLN
Status: DC | PRN
  Administered 2014-06-07: 1500 mL

## 2014-06-07 MED ORDER — PIPERACILLIN-TAZOBACTAM 3.375 G IVPB
3.3750 g | Freq: Three times a day (TID) | INTRAVENOUS | Status: DC
  Administered 2014-06-07 – 2014-06-11 (×11): 3.375 g via INTRAVENOUS
  Filled 2014-06-07 (×12): qty 50

## 2014-06-07 MED ORDER — LACTATED RINGERS IV SOLN
INTRAVENOUS | Status: DC | PRN
  Administered 2014-06-07 (×4): via INTRAVENOUS

## 2014-06-07 MED ORDER — CHLORHEXIDINE GLUCONATE 4 % EX LIQD: 1.0000 "application " | Freq: Once | CUTANEOUS | Status: DC

## 2014-06-07 MED ORDER — PROMETHAZINE HCL 25 MG/ML IJ SOLN
6.2500 mg | INTRAMUSCULAR | Status: DC | PRN
  Filled 2014-06-07: qty 1

## 2014-06-07 MED ORDER — BISACODYL 10 MG RE SUPP: 10.0000 mg | Freq: Two times a day (BID) | RECTAL | Status: DC | PRN

## 2014-06-07 MED ORDER — NEOSTIGMINE METHYLSULFATE 10 MG/10ML IV SOLN
INTRAVENOUS | Status: DC | PRN
  Administered 2014-06-07: 4 mg via INTRAVENOUS

## 2014-06-07 MED ORDER — METRONIDAZOLE IN NACL 5-0.79 MG/ML-% IV SOLN
500.0000 mg | Freq: Four times a day (QID) | INTRAVENOUS | Status: DC
  Administered 2014-06-07 (×2): 500 mg via INTRAVENOUS
  Filled 2014-06-07: qty 100

## 2014-06-07 MED ORDER — SACCHAROMYCES BOULARDII 250 MG PO CAPS
250.0000 mg | ORAL_CAPSULE | Freq: Two times a day (BID) | ORAL | Status: DC
  Filled 2014-06-07 (×3): qty 1

## 2014-06-07 MED ORDER — HYDROMORPHONE HCL 1 MG/ML IJ SOLN
0.2500 mg | INTRAMUSCULAR | Status: DC | PRN
  Administered 2014-06-07: 0.25 mg via INTRAVENOUS

## 2014-06-07 MED ORDER — FUROSEMIDE 10 MG/ML IJ SOLN
40.0000 mg | Freq: Once | INTRAMUSCULAR | Status: AC
  Administered 2014-06-07: 40 mg via INTRAVENOUS

## 2014-06-07 MED ORDER — MIDAZOLAM HCL 5 MG/5ML IJ SOLN
INTRAMUSCULAR | Status: DC | PRN
  Administered 2014-06-07: 1 mg via INTRAVENOUS

## 2014-06-07 MED ORDER — CEFOTETAN DISODIUM 2 G IJ SOLR
2.0000 g | INTRAMUSCULAR | Status: AC
  Administered 2014-06-07: 2 g via INTRAVENOUS

## 2014-06-07 MED ORDER — CISATRACURIUM BESYLATE (PF) 10 MG/5ML IV SOLN
INTRAVENOUS | Status: DC | PRN
  Administered 2014-06-07: 2 mg via INTRAVENOUS
  Administered 2014-06-07: 4 mg via INTRAVENOUS
  Administered 2014-06-07: 10 mg via INTRAVENOUS

## 2014-06-07 MED ORDER — PHENOL 1.4 % MT LIQD: 2.0000 | OROMUCOSAL | Status: DC | PRN

## 2014-06-07 MED ORDER — LIP MEDEX EX OINT
TOPICAL_OINTMENT | CUTANEOUS | Status: AC
  Filled 2014-06-07: qty 7

## 2014-06-07 MED ORDER — CETYLPYRIDINIUM CHLORIDE 0.05 % MT LIQD
7.0000 mL | Freq: Two times a day (BID) | OROMUCOSAL | Status: DC
  Administered 2014-06-08 – 2014-06-13 (×11): 7 mL via OROMUCOSAL

## 2014-06-07 MED ORDER — ACETAMINOPHEN 325 MG PO TABS: 325.0000 mg | ORAL_TABLET | Freq: Four times a day (QID) | ORAL | Status: DC | PRN

## 2014-06-07 MED ORDER — PROPOFOL 10 MG/ML IV BOLUS
INTRAVENOUS | Status: AC
  Filled 2014-06-07: qty 20

## 2014-06-07 MED ORDER — CITALOPRAM HYDROBROMIDE 10 MG PO TABS
10.0000 mg | ORAL_TABLET | Freq: Every day | ORAL | Status: DC
  Filled 2014-06-07: qty 1

## 2014-06-07 MED ORDER — MAGIC MOUTHWASH
15.0000 mL | Freq: Four times a day (QID) | ORAL | Status: DC | PRN
  Administered 2014-06-14 (×3): 15 mL via ORAL
  Filled 2014-06-07 (×5): qty 15

## 2014-06-07 MED ORDER — GLYCOPYRROLATE 0.2 MG/ML IJ SOLN
INTRAMUSCULAR | Status: DC | PRN
  Administered 2014-06-07: .6 mg via INTRAVENOUS

## 2014-06-07 MED ORDER — KCL IN DEXTROSE-NACL 40-5-0.45 MEQ/L-%-% IV SOLN
INTRAVENOUS | Status: DC
  Administered 2014-06-07: 06:00:00 via INTRAVENOUS
  Filled 2014-06-07 (×2): qty 1000

## 2014-06-07 MED ORDER — ONDANSETRON HCL 4 MG/2ML IJ SOLN
INTRAMUSCULAR | Status: AC
  Filled 2014-06-07: qty 2

## 2014-06-07 MED ORDER — LIDOCAINE HCL (CARDIAC) 20 MG/ML IV SOLN
INTRAVENOUS | Status: AC
  Filled 2014-06-07: qty 5

## 2014-06-07 MED ORDER — DIPHENHYDRAMINE HCL 50 MG/ML IJ SOLN: 12.5000 mg | Freq: Four times a day (QID) | INTRAMUSCULAR | Status: DC | PRN

## 2014-06-07 MED ORDER — CEFOTETAN DISODIUM-DEXTROSE 2-2.08 GM-% IV SOLR
INTRAVENOUS | Status: AC
  Filled 2014-06-07: qty 50

## 2014-06-07 MED ORDER — ALUM & MAG HYDROXIDE-SIMETH 200-200-20 MG/5ML PO SUSP: 30.0000 mL | Freq: Four times a day (QID) | ORAL | Status: DC | PRN

## 2014-06-07 MED ORDER — LORAZEPAM 2 MG/ML IJ SOLN: 0.5000 mg | Freq: Three times a day (TID) | INTRAMUSCULAR | Status: DC | PRN

## 2014-06-07 MED ORDER — DEXAMETHASONE SODIUM PHOSPHATE 10 MG/ML IJ SOLN
INTRAMUSCULAR | Status: AC
  Filled 2014-06-07: qty 1

## 2014-06-07 MED ORDER — LACTATED RINGERS IV SOLN
INTRAVENOUS | Status: DC
  Administered 2014-06-07: 09:00:00 via INTRAVENOUS

## 2014-06-07 MED ORDER — PROPOFOL 10 MG/ML IV BOLUS
INTRAVENOUS | Status: DC | PRN
  Administered 2014-06-07: 50 mg via INTRAVENOUS

## 2014-06-07 MED ORDER — DEXAMETHASONE SODIUM PHOSPHATE 10 MG/ML IJ SOLN
INTRAMUSCULAR | Status: DC | PRN
  Administered 2014-06-07: 10 mg via INTRAVENOUS

## 2014-06-07 MED ORDER — ONDANSETRON 8 MG/NS 50 ML IVPB
8.0000 mg | Freq: Four times a day (QID) | INTRAVENOUS | Status: DC | PRN
Start: 2014-06-07 — End: 2014-06-15
  Filled 2014-06-07: qty 8

## 2014-06-07 MED ORDER — SUFENTANIL CITRATE 50 MCG/ML IV SOLN
INTRAVENOUS | Status: AC
  Filled 2014-06-07: qty 1

## 2014-06-07 MED ORDER — SODIUM CHLORIDE 0.9 % IJ SOLN
INTRAMUSCULAR | Status: AC
  Filled 2014-06-07: qty 10

## 2014-06-07 MED ORDER — METRONIDAZOLE IN NACL 5-0.79 MG/ML-% IV SOLN
INTRAVENOUS | Status: AC
  Filled 2014-06-07: qty 100

## 2014-06-07 MED ORDER — SUFENTANIL CITRATE 50 MCG/ML IV SOLN
INTRAVENOUS | Status: DC | PRN
  Administered 2014-06-07 (×5): 10 ug via INTRAVENOUS

## 2014-06-07 MED ORDER — CHLORHEXIDINE GLUCONATE 0.12 % MT SOLN
15.0000 mL | Freq: Two times a day (BID) | OROMUCOSAL | Status: DC
  Administered 2014-06-07 – 2014-06-15 (×14): 15 mL via OROMUCOSAL
  Filled 2014-06-07 (×18): qty 15

## 2014-06-07 MED ORDER — SODIUM CHLORIDE 0.9 % IJ SOLN
INTRAMUSCULAR | Status: AC
  Filled 2014-06-07: qty 3

## 2014-06-07 MED ORDER — SODIUM CHLORIDE 0.9 % IV SOLN
20.0000 mg | INTRAVENOUS | Status: DC | PRN
  Administered 2014-06-07: 80 ug/min via INTRAVENOUS

## 2014-06-07 MED ORDER — GI COCKTAIL ~~LOC~~: 30.0000 mL | Freq: Once | ORAL | Status: DC

## 2014-06-07 MED ORDER — IOHEXOL 350 MG/ML SOLN
100.0000 mL | Freq: Once | INTRAVENOUS | Status: AC | PRN
  Administered 2014-06-07: 100 mL via INTRAVENOUS

## 2014-06-07 MED ORDER — CISATRACURIUM BESYLATE 20 MG/10ML IV SOLN
INTRAVENOUS | Status: AC
  Filled 2014-06-07: qty 10

## 2014-06-07 SURGICAL SUPPLY — 35 items
APPLICATOR COTTON TIP 6IN STRL (MISCELLANEOUS) ×1 IMPLANT
BLADE EXTENDED COATED 6.5IN (ELECTRODE) ×2 IMPLANT
BLADE HEX COATED 2.75 (ELECTRODE) ×3 IMPLANT
COVER MAYO STAND STRL (DRAPES) ×2 IMPLANT
DRAPE LAPAROSCOPIC ABDOMINAL (DRAPES) ×3 IMPLANT
DRAPE WARM FLUID 44X44 (DRAPE) ×2 IMPLANT
DRSG OPSITE POSTOP 4X10 (GAUZE/BANDAGES/DRESSINGS) ×2 IMPLANT
ELECT LIGASURE SHORT 9 REUSE (ELECTRODE) ×2 IMPLANT
ELECT REM PT RETURN 9FT ADLT (ELECTROSURGICAL) ×3
ELECTRODE REM PT RTRN 9FT ADLT (ELECTROSURGICAL) ×1 IMPLANT
GAUZE SPONGE 4X4 12PLY STRL (GAUZE/BANDAGES/DRESSINGS) ×1 IMPLANT
GLOVE BIOGEL PI IND STRL 7.0 (GLOVE) ×1 IMPLANT
GLOVE BIOGEL PI INDICATOR 7.0 (GLOVE) ×2
GOWN STRL REUS W/TWL LRG LVL3 (GOWN DISPOSABLE) ×3 IMPLANT
GOWN STRL REUS W/TWL XL LVL3 (GOWN DISPOSABLE) ×10 IMPLANT
KIT BASIN OR (CUSTOM PROCEDURE TRAY) ×3 IMPLANT
NS IRRIG 1000ML POUR BTL (IV SOLUTION) ×3 IMPLANT
PACK GENERAL/GYN (CUSTOM PROCEDURE TRAY) ×3 IMPLANT
SPONGE LAP 18X18 X RAY DECT (DISPOSABLE) IMPLANT
STAPLER GUN LINEAR PROX 60 (STAPLE) ×2 IMPLANT
STAPLER VISISTAT 35W (STAPLE) ×3 IMPLANT
SUCTION POOLE TIP (SUCTIONS) ×2 IMPLANT
SUT PDS AB 1 CTX 36 (SUTURE) IMPLANT
SUT PDS AB 1 TP1 96 (SUTURE) ×4 IMPLANT
SUT SILK 2 0 (SUTURE) ×3
SUT SILK 2 0 SH CR/8 (SUTURE) ×2 IMPLANT
SUT SILK 2-0 18XBRD TIE 12 (SUTURE) IMPLANT
SUT SILK 3 0 (SUTURE) ×3
SUT SILK 3 0 SH CR/8 (SUTURE) ×2 IMPLANT
SUT SILK 3-0 18XBRD TIE 12 (SUTURE) IMPLANT
SWAB COLLECTION DEVICE MRSA (MISCELLANEOUS) ×2 IMPLANT
TOWEL OR 17X26 10 PK STRL BLUE (TOWEL DISPOSABLE) ×4 IMPLANT
TRAY FOLEY CATH 14FRSI W/METER (CATHETERS) ×2 IMPLANT
TUBE ANAEROBIC SPECIMEN COL (MISCELLANEOUS) ×2 IMPLANT
YANKAUER SUCT BULB TIP NO VENT (SUCTIONS) ×2 IMPLANT

## 2014-06-07 NOTE — ED Notes (Signed)
Please call pt's husband Merry Proud @ [phone] if pt is transferred from department.  Pt has set up password "[email or web address]" so husband may call and receive any PHI on pt.

## 2014-06-07 NOTE — ED Provider Notes (Addendum)
CSN: [id number]     Arrival date & time 06/06/14  2309 History   First MD Initiated Contact with Patient 06/07/14 0028     Chief Complaint  Patient presents with  . Chest Pain     (Consider location/radiation/quality/duration/timing/severity/associated sxs/prior Treatment) Patient is a 44 y.o. female presenting with chest pain. The history is provided by the patient.  Chest Pain Pain location:  Epigastric Pain quality: sharp   Pain severity:  Moderate Onset quality:  Sudden Timing:  Constant Progression:  Unchanged Chronicity:  New Context: stress   Relieved by:  Nothing Worsened by:  Nothing tried Ineffective treatments:  None tried Associated symptoms: anxiety   Associated symptoms: no abdominal pain, no diaphoresis, no fever, no lower extremity edema, no nausea, no shortness of breath and not vomiting   Risk factors: no aortic disease   Patient describes months of episodic vomiting for a few days at a time then gets better.  She thought it was GERD so she stopped eating after 7 pm. Then she had sudden onset epigastric and chest pain with neck and back radiation and tingling of the left arm and right leg this evening.  She admits to being under a lot of stress at work and feels anxious.  She is concerned the pain is breast cancer related as mom had BCA.   Past Medical History  Diagnosis Date  . Bartholin's duct cyst     Left; S/P I & D 01/17/2009  . H/O cervical polypectomy     2010  (benign)   Past Surgical History  Procedure Laterality Date  . Wisdom tooth extraction     Family History  Problem Relation Age of Onset  . Heart disease Father   . Hypertension Father   . Other Mother     varicose veins  . Cancer Mother   . Hypertension Mother   . Diabetes Mother   . Breast cancer Mother 59  . Lupus Cousin    History  Substance Use Topics  . Smoking status: Never Smoker   . Smokeless tobacco: Never Used  . Alcohol Use: Yes     Comment: socially   OB History     Gravida Para Term Preterm AB TAB SAB Ectopic Multiple Living   3 2   1  1   2      Review of Systems  Constitutional: Negative for fever and diaphoresis.  HENT: Positive for congestion.   Respiratory: Negative for chest tightness and shortness of breath.   Cardiovascular: Positive for chest pain. Negative for leg swelling.  Gastrointestinal: Negative for nausea, vomiting and abdominal pain.  All other systems reviewed and are negative.     Allergies  Sulfa antibiotics  Home Medications   Prior to Admission medications   Medication Sig Start Date End Date Taking? Authorizing Provider  citalopram (CELEXA) 10 MG tablet Take 10 mg by mouth daily.    Historical Provider, MD  Multiple Vitamin (MULTIVITAMIN) tablet Take 1 tablet by mouth daily.    Historical Provider, MD   BP 131/94 mmHg  Pulse 126  Temp(Src) 99.4 F (37.4 C) (Oral)  Resp 16  Ht 5\' 3"  (1.6 m)  Wt 165 lb (74.844 kg)  BMI 29.24 kg/m2  SpO2 100%  LMP 05/23/2014 Physical Exam  Constitutional: She is oriented to person, place, and time. She appears well-developed and well-nourished. No distress.  HENT:  Head: Normocephalic and atraumatic.  Mouth/Throat: Oropharynx is clear and moist.  Eyes: Conjunctivae are normal. Pupils are equal,  round, and reactive to light.  Neck: Normal range of motion. Neck supple.  Cardiovascular: Regular rhythm.  Tachycardia present.   Pulmonary/Chest: Effort normal and breath sounds normal. No respiratory distress. She has no wheezes. She has no rales.  Abdominal: Soft. Bowel sounds are normal. There is no tenderness. There is no rigidity, no rebound, no guarding, no tenderness at McBurney's point and negative Murphy's sign.  Musculoskeletal: Normal range of motion. She exhibits no edema or tenderness.  Neurological: She is alert and oriented to person, place, and time. She has normal reflexes.  Skin: Skin is warm and dry.  Psychiatric: Her mood appears anxious.    ED Course   Procedures (including critical care time) Labs Review Labs Reviewed  CBC - Abnormal; Notable for the following:    Hemoglobin 10.5 (*)    HCT 34.8 (*)    MCV 75.0 (*)    MCH 22.6 (*)    RDW 16.4 (*)    All other components within normal limits  BASIC METABOLIC PANEL - Abnormal; Notable for the following:    Glucose, Bld 107 (*)    GFR calc non Af Amer 69 (*)    GFR calc Af Amer 80 (*)    Anion gap 16 (*)    All other components within normal limits  I-STAT CHEM 8, ED - Abnormal; Notable for the following:    Glucose, Bld 106 (*)    All other components within normal limits  LIPASE, BLOOD  HEPATIC FUNCTION PANEL  I-STAT TROPOININ, ED    Imaging Review No results found.   EKG Interpretation   Date/Time:  Tuesday June 06 2014 23:21:13 EDT Ventricular Rate:  133 PR Interval:  120 QRS Duration: 79 QT Interval:  331 QTC Calculation: 492 R Axis:   82 Text Interpretation:  Sinus tachycardia Confirmed by [facility]  MD,  Deronda Christian ([id number]) on 06/07/2014 12:44:29 AM      MDM   Final diagnoses:  None    Results for orders placed or performed during the hospital encounter of 06/06/14  CBC  Result Value Ref Range   WBC 5.4 4.0 - 10.5 K/uL   RBC 4.64 3.87 - 5.11 MIL/uL   Hemoglobin 10.5 (L) 12.0 - 15.0 g/dL   HCT 34.8 (L) 36.0 - 46.0 %   MCV 75.0 (L) 78.0 - 100.0 fL   MCH 22.6 (L) 26.0 - 34.0 pg   MCHC 30.2 30.0 - 36.0 g/dL   RDW 16.4 (H) 11.5 - 15.5 %   Platelets 334 150 - 400 K/uL  Basic metabolic panel  Result Value Ref Range   Sodium 139 135 - 145 mmol/L   Potassium 3.9 3.5 - 5.1 mmol/L   Chloride 102 96 - 112 mmol/L   CO2 21 19 - 32 mmol/L   Glucose, Bld 107 (H) 70 - 99 mg/dL   BUN 20 6 - 23 mg/dL   Creatinine, Ser 0.98 0.50 - 1.10 mg/dL   Calcium 9.7 8.4 - 10.5 mg/dL   GFR calc non Af Amer 69 (L) >90 mL/min   GFR calc Af Amer 80 (L) >90 mL/min   Anion gap 16 (H) 5 - 15  Lipase, blood  Result Value Ref Range   Lipase 20 11 - 59 U/L  Hepatic function  panel  Result Value Ref Range   Total Protein 7.8 6.0 - 8.3 g/dL   Albumin 3.9 3.5 - 5.2 g/dL   AST 44 (H) 0 - 37 U/L   ALT 31 0 - 35  U/L   Alkaline Phosphatase 207 (H) 39 - 117 U/L   Total Bilirubin 0.6 0.3 - 1.2 mg/dL   Bilirubin, Direct 0.2 0.0 - 0.5 mg/dL   Indirect Bilirubin 0.4 0.3 - 0.9 mg/dL  D-dimer, quantitative  Result Value Ref Range   D-Dimer, Quant >20.00 (H) 0.00 - 0.48 ug/mL-FEU  I-stat troponin, ED (not at [facility])  Result Value Ref Range   Troponin i, poc 0.00 0.00 - 0.08 ng/mL   Comment 3          I-Stat Chem 8, ED  Result Value Ref Range   Sodium 140 135 - 145 mmol/L   Potassium 3.9 3.5 - 5.1 mmol/L   Chloride 102 96 - 112 mmol/L   BUN 21 6 - 23 mg/dL   Creatinine, Ser 0.90 0.50 - 1.10 mg/dL   Glucose, Bld 106 (H) 70 - 99 mg/dL   Calcium, Ion 1.20 1.12 - 1.23 mmol/L   TCO2 20 0 - 100 mmol/L   Hemoglobin 12.9 12.0 - 15.0 g/dL   HCT 38.0 36.0 - 46.0 %  POC urine preg, ED (not at [facility])  Result Value Ref Range   Preg Test, Ur NEGATIVE NEGATIVE  I-stat troponin, ED  Result Value Ref Range   Troponin i, poc 0.03 0.00 - 0.08 ng/mL   Comment 3          I-Stat CG4 Lactic Acid, ED  Result Value Ref Range   Lactic Acid, Venous 3.09 (HH) 0.5 - 2.0 mmol/L   Comment NOTIFIED PHYSICIAN   POC occult blood, ED  Result Value Ref Range   Fecal Occult Bld NEGATIVE NEGATIVE  Type and screen  Result Value Ref Range   ABO/RH(D) O POS    Antibody Screen NEG    Sample Expiration 06/10/2014    Dg Chest Port 1 View  06/07/2014   CLINICAL DATA:  Acute onset of chest pain and shortness of breath. Back pain for 1 day. Initial encounter.  EXAM: PORTABLE CHEST - 1 VIEW  COMPARISON:  None.  FINDINGS: The lungs are well-aerated. Pulmonary vascularity is at the upper limits of normal. There is no evidence of focal opacification, pleural effusion or pneumothorax.  The cardiomediastinal silhouette is within normal limits. No acute osseous abnormalities are seen.  IMPRESSION: No acute  cardiopulmonary process seen.   Electronically Signed   By: Garald Balding M.D.   On: 06/07/2014 01:20   Ct Angio Chest Aorta W/cm &/or Wo/cm  06/07/2014   CLINICAL DATA:  Acute onset of chest pain for 2 hours, radiating to the back. Nausea, vomiting, shortness of breath and diaphoresis. Elevated D-dimer. Arm tingling and leg weakness. Initial encounter.  EXAM: CT ANGIOGRAPHY CHEST, ABDOMEN AND PELVIS  TECHNIQUE: Multidetector CT imaging through the chest, abdomen and pelvis was performed using the standard protocol during bolus administration of intravenous contrast. Multiplanar reconstructed images and MIPs were obtained and reviewed to evaluate the vascular anatomy.  CONTRAST:  156mL OMNIPAQUE IOHEXOL 350 MG/ML SOLN  COMPARISON:  None.  FINDINGS: CTA CHEST FINDINGS  There is no evidence of aortic dissection. There is no evidence of aneurysmal dilatation. No calcific atherosclerotic disease is seen. The great vessels are unremarkable in appearance.  There is no evidence of pulmonary embolus.  Minimal bibasilar atelectasis is noted. Minimal nodularity at the lung apices likely also reflects atelectasis, though minimal infection might have a similar appearance. There is no evidence of pleural effusion or pneumothorax. No masses are identified; no abnormal focal contrast enhancement is seen.  The mediastinum is unremarkable in appearance. A tiny calcified node is seen adjacent to the distal esophagus. There is question of mild distal esophageal wall thickening, which could reflect minimal esophagitis, or could remain within normal limits. No pericardial effusion is identified. No axillary lymphadenopathy is seen. The visualized portions of the thyroid gland are unremarkable in appearance.  Mild nodularity to the left fibroglandular tissue is thought to remain within normal limits. No suspicious soft tissue masses are seen within the chest.  No acute osseous abnormalities are seen.  Review of the MIP images  confirms the above findings.  CTA ABDOMEN AND PELVIS FINDINGS  There is no evidence of aortic dissection. There is no evidence of aneurysmal dilatation. No calcific atherosclerotic disease is seen. Incidental note is made of a circumaortic left renal vein. The celiac trunk, superior mesenteric artery, bilateral renal arteries and inferior mesenteric artery appear patent. The inferior vena cava is unremarkable in appearance.  There is a large complex mass at the left lower quadrant. It measures approximately 9.3 x 8.4 x 7.5 cm, with a large amount of fluid and air noted centrally, and a diffusely irregular peripheral soft tissue border. Mild surrounding soft tissue inflammation is seen. This is suggested to be contiguous with the adjacent mid jejunum.  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 Jejunal adenocarcinoma can mimic this appearance, and metastatic disease as might be seen with melanoma can also have such an appearance, though there are no additional findings seen to suggest melanoma.  A centrally necrotic tumor is thought to be less likely, given the relatively typical appearance for small bowel lymphoma. A walled-off abscess is also very unlikely, given recruitment of vasculature surrounding the mass, adjacent mild nodularity overlying the descending colon, and a few prominent associated mesenteric nodes measuring up to 1.0 cm in size (best seen on coronal images).  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 However, the cecum and appendix are still noted on the right side.  There is a somewhat heterogeneous appearance to hepatic enhancement, which may reflect underlying fatty infiltration or mild venous congestion. No definite focal mass is seen. The spleen is unremarkable in appearance. The gallbladder is within normal limits. Hepatic hilar nodes remain normal in  size. The pancreas and adrenal glands are unremarkable.  A 1.0 cm cyst is noted at the upper pole of the right kidney. The kidneys are otherwise unremarkable. There is no evidence of hydronephrosis. No renal or ureteral stones are seen. No perinephric stranding is appreciated.  Trace fluid within the pelvis may be physiologic in nature, or could be related to the left lower quadrant abdominal mass. The stomach is within normal limits.  The appendix is normal in caliber, without evidence for appendicitis. The colon is partially filled with stool and is unremarkable in appearance, aside from mild adjacent nodularity along the descending colon as described above, at the level of the mass.  The bladder is mildly distended and grossly unremarkable. The uterus has a somewhat heterogeneous appearance, but remains normal in size. This may reflect multiple tiny fibroids. The ovaries are relatively symmetric. No suspicious adnexal masses are seen. No inguinal lymphadenopathy is seen.  No acute osseous abnormalities are identified.  Review of the MIP images confirms the above findings.  IMPRESSION: 1. No evidence of aortic dissection. No evidence of aneurysmal dilatation. No calcific atherosclerotic disease seen. 2. Large complex 9.3  x 8.4 x 7.5 cm mass noted at the left lower quadrant. It contains a large amount of fluid and air, with a diffusely irregular peripheral soft tissue or. Mild surrounding soft tissue inflammation seen. This is suggested to be contiguous with the adjacent mid ileum. 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 Jejunal adenocarcinoma can mimic this appearance, and metastatic disease as might be seen with melanoma can also have such an appearance, though there are no additional findings to suggest melanoma. 3. There is recruitment of vasculature about the mass, mild nodularity overlying the adjacent descending colon, and a  few prominent associated mesenteric nodes. These findings are highly suggestive of malignancy. 4. 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 However, the cecum and appendix are still seen on the right side. 5. Minimal nodularity at the lung apices likely reflects atelectasis, though minimal infection might have a similar appearance. Minimal bibasilar atelectasis noted. 6. Suggestion of mild distal esophageal wall thickening, which could reflect minimal esophagitis, or could remain within normal limits. 7. Circumaortic left renal vein incidentally noted. 8. Somewhat heterogeneous appearance to hepatic enhancement. This may reflect underlying fatty infiltration or mild venous congestion. No focal mass seen. 9. Small right renal cyst noted. 10. Trace free fluid within the pelvis may be physiologic in nature, or could be related to the left lower quadrant abdominal mass. 11. Heterogeneous appearance to the uterus may reflect multiple tiny fibroids.  These results were called by telephone at the time of interpretation on 06/07/2014 at 2:58 am to Dr. Veatrice Kells, who verbally acknowledged these results.   Electronically Signed   By: Garald Balding M.D.   On: 06/07/2014 03:55   Ct Angio Abd/pel W/ And/or W/o  06/07/2014   CLINICAL DATA:  Acute onset of chest pain for 2 hours, radiating to the back. Nausea, vomiting, shortness of breath and diaphoresis. Elevated D-dimer. Arm tingling and leg weakness. Initial encounter.  EXAM: CT ANGIOGRAPHY CHEST, ABDOMEN AND PELVIS  TECHNIQUE: Multidetector CT imaging through the chest, abdomen and pelvis was performed using the standard protocol during bolus administration of intravenous contrast. Multiplanar reconstructed images and MIPs were obtained and reviewed to evaluate the vascular anatomy.  CONTRAST:  173mL OMNIPAQUE IOHEXOL 350 MG/ML SOLN  COMPARISON:  None.  FINDINGS: CTA CHEST FINDINGS  There is no  evidence of aortic dissection. There is no evidence of aneurysmal dilatation. No calcific atherosclerotic disease is seen. The great vessels are unremarkable in appearance.  There is no evidence of pulmonary embolus.  Minimal bibasilar atelectasis is noted. Minimal nodularity at the lung apices likely also reflects atelectasis, though minimal infection might have a similar appearance. There is no evidence of pleural effusion or pneumothorax. No masses are identified; no abnormal focal contrast enhancement is seen.  The mediastinum is unremarkable in appearance. A tiny calcified node is seen adjacent to the distal esophagus. There is question of mild distal esophageal wall thickening, which could reflect minimal esophagitis, or could remain within normal limits. No pericardial effusion is identified. No axillary lymphadenopathy is seen. The visualized portions of the thyroid gland are unremarkable in appearance.  Mild nodularity to the left fibroglandular tissue is thought to remain within normal limits. No suspicious soft tissue masses are seen within the chest.  No acute osseous abnormalities are seen.  Review of the MIP images confirms the above findings.  CTA ABDOMEN AND PELVIS  FINDINGS  There is no evidence of aortic dissection. There is no evidence of aneurysmal dilatation. No calcific atherosclerotic disease is seen. Incidental note is made of a circumaortic left renal vein. The celiac trunk, superior mesenteric artery, bilateral renal arteries and inferior mesenteric artery appear patent. The inferior vena cava is unremarkable in appearance.  There is a large complex mass at the left lower quadrant. It measures approximately 9.3 x 8.4 x 7.5 cm, with a large amount of fluid and air noted centrally, and a diffusely irregular peripheral soft tissue border. Mild surrounding soft tissue inflammation is seen. This is suggested to be contiguous with the adjacent mid jejunum.  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 Jejunal adenocarcinoma can mimic this appearance, and metastatic disease as might be seen with melanoma can also have such an appearance, though there are no additional findings seen to suggest melanoma.  A centrally necrotic tumor is thought to be less likely, given the relatively typical appearance for small bowel lymphoma. A walled-off abscess is also very unlikely, given recruitment of vasculature surrounding the mass, adjacent mild nodularity overlying the descending colon, and a few prominent associated mesenteric nodes measuring up to 1.0 cm in size (best seen on coronal images).  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 However, the cecum and appendix are still noted on the right side.  There is a somewhat heterogeneous appearance to hepatic enhancement, which may reflect underlying fatty infiltration or mild venous congestion. No definite focal mass is seen. The spleen is unremarkable in appearance. The gallbladder is within normal limits. Hepatic hilar nodes remain normal in size. The pancreas and adrenal glands are unremarkable.  A 1.0 cm cyst is noted at the upper pole of the right kidney. The kidneys are otherwise unremarkable. There is no evidence of hydronephrosis. No renal or ureteral stones are seen. No perinephric stranding is appreciated.  Trace fluid within the pelvis may be physiologic in nature, or could be related to the left lower quadrant abdominal mass. The stomach is within normal limits.  The appendix is normal in caliber, without evidence for appendicitis. The colon is partially filled with stool and is unremarkable in appearance, aside from mild adjacent nodularity along the descending colon as described above, at the level of the mass.  The bladder is mildly distended and grossly unremarkable. The uterus has a somewhat heterogeneous  appearance, but remains normal in size. This may reflect multiple tiny fibroids. The ovaries are relatively symmetric. No suspicious adnexal masses are seen. No inguinal lymphadenopathy is seen.  No acute osseous abnormalities are identified.  Review of the MIP images confirms the above findings.  IMPRESSION: 1. No evidence of aortic dissection. No evidence of aneurysmal dilatation. No calcific atherosclerotic disease seen. 2. Large complex 9.3 x 8.4 x 7.5 cm mass noted at the left lower quadrant. It contains a large amount of fluid and air, with a diffusely irregular peripheral soft tissue or. Mild surrounding soft tissue inflammation seen. This is suggested to be contiguous with the adjacent mid ileum. 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 Jejunal adenocarcinoma can mimic this appearance, and metastatic disease as might be seen with melanoma can also have such an appearance, though there are no additional findings to suggest melanoma. 3. There is recruitment of vasculature about the mass, mild  nodularity overlying the adjacent descending colon, and a few prominent associated mesenteric nodes. These findings are highly suggestive of malignancy. 4. 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 However, the cecum and appendix are still seen on the right side. 5. Minimal nodularity at the lung apices likely reflects atelectasis, though minimal infection might have a similar appearance. Minimal bibasilar atelectasis noted. 6. Suggestion of mild distal esophageal wall thickening, which could reflect minimal esophagitis, or could remain within normal limits. 7. Circumaortic left renal vein incidentally noted. 8. Somewhat heterogeneous appearance to hepatic enhancement. This may reflect underlying fatty infiltration or mild venous congestion. No focal mass seen. 9. Small  right renal cyst noted. 10. Trace free fluid within the pelvis may be physiologic in nature, or could be related to the left lower quadrant abdominal mass. 11. Heterogeneous appearance to the uterus may reflect multiple tiny fibroids.  These results were called by telephone at the time of interpretation on 06/07/2014 at 2:58 am to Dr. Veatrice Kells, who verbally acknowledged these results.   Electronically Signed   By: Garald Balding M.D.   On: 06/07/2014 03:55   Medications  gi cocktail (Maalox,Lidocaine,Donnatal) (not administered)  dextrose 5 % and 0.45 % NaCl with KCl 40 mEq/L infusion (not administered)  acetaminophen (TYLENOL) suppository 650 mg (not administered)  acetaminophen (TYLENOL) tablet 325-650 mg (not administered)  HYDROmorphone (DILAUDID) injection 0.5-2 mg (not administered)  alum & mag hydroxide-simeth (MAALOX/MYLANTA) 200-200-20 MG/5ML suspension 30 mL (not administered)  lip balm (CARMEX) ointment 1 application (not administered)  magic mouthwash (not administered)  menthol-cetylpyridinium (CEPACOL) lozenge 3 mg (not administered)  phenol (CHLORASEPTIC) mouth spray 2 spray (not administered)  bisacodyl (DULCOLAX) suppository 10 mg (not administered)  saccharomyces boulardii (FLORASTOR) capsule 250 mg (not administered)  ondansetron (ZOFRAN) injection 4 mg (not administered)    Or  ondansetron (ZOFRAN) 8 mg/NS 50 ml IVPB (not administered)  promethazine (PHENERGAN) injection 6.25-12.5 mg (not administered)  diphenhydrAMINE (BENADRYL) injection 12.5-25 mg (not administered)  zolpidem (AMBIEN) tablet 5 mg (not administered)  lactated ringers bolus 1,000 mL (not administered)  LORazepam (ATIVAN) injection 0.5-1 mg (not administered)  citalopram (CELEXA) tablet 10 mg (not administered)  multivitamin with minerals tablet 1 tablet (not administered)  heparin injection 5,000 Units (not administered)  chlorhexidine (HIBICLENS) 4 % liquid 1 application (not administered)   chlorhexidine (HIBICLENS) 4 % liquid 1 application (not administered)  cefoTEtan (CEFOTAN) 2 g in dextrose 5 % 50 mL IVPB (not administered)  cefTRIAXone (ROCEPHIN) 2 g in dextrose 5 % 50 mL IVPB (0 g Intravenous Stopped 06/07/14 0531)  metroNIDAZOLE (FLAGYL) IVPB 500 mg (not administered)  ondansetron (ZOFRAN) injection 4 mg (4 mg Intravenous Given 06/07/14 0107)  sodium chloride 0.9 % bolus 2,000 mL (0 mLs Intravenous Stopped 06/07/14 0444)  iohexol (OMNIPAQUE) 350 MG/ML injection 100 mL (100 mLs Intravenous Contrast Given 06/07/14 0242)  sodium chloride 0.9 % bolus 1,000 mL (1,000 mLs Intravenous New Bag/Given 06/07/14 0342)  fentaNYL (SUBLIMAZE) injection 50 mcg (50 mcg Intravenous Given 06/07/14 0345)     MDM Reviewed: nursing note and vitals Interpretation: labs and ECG (slight anemia elevated lactate and ddimer.  Negative CXR by me) Consults: general surgery (radiology)    CRITICAL CARE Performed by: Carlisle Beers Total critical care time: 90 minutes Critical care time was exclusive of separately billable procedures and treating other patients. Critical care was necessary to treat or prevent imminent or life-threatening deterioration. Critical care was time spent  personally by me on the following activities: development of treatment plan with patient and/or surrogate as well as nursing, discussions with consultants, evaluation of patient's response to treatment, examination of patient, obtaining history from patient or surrogate, ordering and performing treatments and interventions, ordering and review of laboratory studies, ordering and review of radiographic studies, pulse oximetry and re-evaluation of patient's condition.     Veatrice Kells, MD 06/07/14 [phone]

## 2014-06-07 NOTE — ED Notes (Signed)
EDP and Dr. Johney Maine are both aware of pt's low BP.  Fluids continue to be infused.

## 2014-06-07 NOTE — ED Notes (Signed)
MD Palumbo at bedside.

## 2014-06-07 NOTE — ED Notes (Addendum)
Pt is extremely anxious and has been under more stress than usual.  Pt is exhibiting classic panic attack sx. Emotional support provided. ST on the monitor. Will continue to monitor.

## 2014-06-07 NOTE — Anesthesia Postprocedure Evaluation (Signed)
  Anesthesia Post-op Note  Patient: Miranda Howe  Procedure(s) Performed: Procedure(s) (LRB): EXPLORATORY LAPAROTOMY RESECTION OF ABDOMINAL MASS AND PORTION OF SMALL BOWEL (N/A)  Patient Location: PACU  Anesthesia Type: General  Level of Consciousness: awake and alert   Airway and Oxygen Therapy: Patient Spontanous Breathing  Post-op Pain: mild  Post-op Assessment: Post-op Vital signs reviewed, Patient's Cardiovascular Status Stable, Respiratory Function Stable, Patent Airway and No signs of Nausea or vomiting  Last Vitals:  Filed Vitals:   06/07/14 1645  BP: 102/65  Pulse: 80  Temp:   Resp: 25    Post-op Vital Signs: stable   Complications: No apparent anesthesia complications

## 2014-06-07 NOTE — Anesthesia Preprocedure Evaluation (Signed)
Anesthesia Evaluation  Patient identified by MRN, date of birth, ID band Patient awake    Reviewed: Allergy & Precautions, NPO status , Patient's Chart, lab work & pertinent test results  Airway Mallampati: II  TM Distance: >3 FB Neck ROM: Full    Dental no notable dental hx.    Pulmonary neg pulmonary ROS,  breath sounds clear to auscultation  Pulmonary exam normal       Cardiovascular negative cardio ROS  Rhythm:Regular Rate:Normal     Neuro/Psych negative neurological ROS  negative psych ROS   GI/Hepatic negative GI ROS, Neg liver ROS,   Endo/Other  negative endocrine ROS  Renal/GU negative Renal ROS  negative genitourinary   Musculoskeletal negative musculoskeletal ROS (+)   Abdominal   Peds negative pediatric ROS (+)  Hematology negative hematology ROS (+)   Anesthesia Other Findings   Reproductive/Obstetrics negative OB ROS                             Anesthesia Physical Anesthesia Plan  ASA: II  Anesthesia Plan: General   Post-op Pain Management:    Induction: Intravenous  Airway Management Planned: Oral ETT  Additional Equipment:   Intra-op Plan:   Post-operative Plan: Extubation in OR  Informed Consent: I have reviewed the patients History and Physical, chart, labs and discussed the procedure including the risks, benefits and alternatives for the proposed anesthesia with the patient or authorized representative who has indicated his/her understanding and acceptance.   Dental advisory given  Plan Discussed with: CRNA  Anesthesia Plan Comments:         Anesthesia Quick Evaluation

## 2014-06-07 NOTE — ED Notes (Signed)
Pt is alert and oriented. She reports that she feels much better than earlier. She is speaking in full sentences and talking on phone to her child.

## 2014-06-07 NOTE — Op Note (Addendum)
Preoperative Diagnosis: Intra-abdominal mass, sepsis  Postoprative Diagnosis: Same  Procedure: Procedure(s): EXPLORATORY LAPAROTOMY RESECTION OF ABDOMINAL MASS AND PORTION OF SMALL BOWEL   Surgeon: Excell Seltzer T   Assistants: Cheral Marker  Anesthesia:  General endotracheal anesthesia  Indications: Patient is a previously healthy 44 year old female who has had some intermittent episodes of nausea and vomiting for a couple of months. Early this morning at the time of admission she developed severe chest pain and malaise and presented to the emergency department. She has been found to be hypotensive and CT scan of the abdomen shows a large mass in the left mid abdomen, containing fluid and air fluid levels and thick-walled consistent with possible neoplasm. I have recommended proceeding with emergency exploratory laparotomy and resection. We discussed possible diagnoses, nature and indications of the procedure and risks of anesthetic complications, bleeding, infection, anastomotic leak. All her questions were answered and she desires to proceed.  Procedure Detail:  Patient was brought to the operating room, placed in the supine position on the operating table, and general endotracheal anesthesia induced. PAS were in place. She received preoperative IV antibiotics. Foley catheter and NG tube were placed. The abdomen was widely sterilely prepped and draped. Patient timeout was performed and correct procedure verified. A midline incision skirting the umbilicus was using a stitch carried down through subcutaneous tissue and midline fascia. The peritoneum was entered under direct vision. There was no free fluid in the abdomen. The Buchkwalter retractor was placed. A large mass was palpable in the left mid abdomen in the anterior portion exposed. It was fairly soft and cystic feeling with a slight amount of surrounding cloudy fluid,, dark purple and possibly ischemic appearing in color.  There was some  omentum adherent to the anterior surface and we came across the omentum with the LigaSure were freeing the anterior surface. The ligament of Treitz was identified and we could see that there was a densely adherent section of proximal jejunum along the medial aspect of the mass as well as a couple of other minimally adherent loops of more distal small bowel. The mass was carefully mobilized from some filmy lateral attachments. We did find that it was adherent to the proximal sigmoid colon. As we carefully examined this it appeared to be chronic scarring or inflammatory adhesions to appendices epiploica and not at all significantly involving the wall of the colon. These adhesions were carefully dissected and divided between clamps and tied. At one point it was directly adjacent to the bowel wall that appeared to have a clear plane away from the colon that was carefully dissected and the mass was completely separated from the sigmoid colon. It was not involving the wall in any significant way. There were some filmy posterior adhesions to the retroperitoneum that were carefully mobilized with mostly blunt dissection and then the mass could be delivered up into the incision with 1 significant area of involvement of the proximal jejunum. At this point the mass was minimally ruptured anteriorly and contained very foul-smelling purulent material which was cultured and suctioned. The mass involved a limited area of the antimesenteric border of the proximal jejunum and I elected to come across the proximal jejunum with a TA 60 stapler about 1/2-1 cm away from the mass transversely across the bowel which left normal bowel lumen and a small cuff of the jejunum on the mass. This was fired and the mass completely separated. There was an obvious opening on the antimesenteric side of the jejunum into the mass,  possibly a diverticulum or perforation into the mass. At this point all gloves and instruments were changed and the abdomen  was thoroughly irrigated with multiple liters of warm saline. Hemostasis was obtained along the side of the sigmoid colon with a few figure-of-eight sutures of 3-0 Vicryl. The entire small bowel and colon were carefully examined and were otherwise normal. The staple line at the proximal jejunum was inverted with interrupted 3-0 silk. 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 The viscera returned to the anatomic position of the abdomen further irrigated. The midline wound was then closed with running looped #1 PDS begun at either end of the incision and tied centrally. The Subcutaneous tissue was irrigated and the skin closed with staples. Sponge needle and instrument counts were correct.    Findings: As above  Estimated Blood Loss:  200 mL         Drains: none  Blood Given: none          Specimens: Abdominal mass and portion of proximal jejunum        Complications:  * No complications entered in OR log *         Disposition: PACU - hemodynamically stable.         Condition: stable  Addendum:  At the time of surgery the mass measured 10 cm in diameter

## 2014-06-07 NOTE — ED Notes (Signed)
MD Hoxworth at bedside. Consent has been signed.

## 2014-06-07 NOTE — Progress Notes (Addendum)
Surgeon on call notified via answering service of patients blood pressure. Two most recent readings 85/46 (56) and 82/50 (58). Patient is mentating appropriately and urine output is adequate. Will continue to monitor while awaiting further orders. Luther Parody, RN   Surgeon on call ordered 1L bolus of LR to be given to patient for decrease in blood pressure. Will continue to monitor. S.Arrin Pintor, RN

## 2014-06-07 NOTE — Consult Note (Signed)
[facility]  [address] [place]  [place], [place] [address] Phone: [phone] FAX: [phone]     BRIANCA FORTENBERRY  June 18, 1970 [id number]  CARE TEAM:  PCP: Pcp Not In System  Outpatient Care Team: Patient Care Team: Pcp Not In System as PCP - General  Inpatient Treatment Team: Treatment Team: Attending Provider: Cy Blamer, MD; Registered Nurse: Fonda Kinder Ward, RN; Registered Nurse: Laveda Norman, RN; Technician: Lowella Dell, EMT; Consulting Physician: Bishop Limbo, MD  This patient is a 44 y.o.female who presents today for surgical evaluation at the request of Dr Eliseo Gum.   Reason for evaluation: Abdominal mass  Healthy G3 [phone] female with chest & sharp epigastric upper abdominal pain.  Sudden & intense.  Feeling stress.  Nausea & vomiting.  Feeling SOB and sweaty. Came into the ED tachycardic HR 120-130s.  EKG c/w tachycardia.  Feeling better after 2L IVF.  Concern of PE vs Panic Attack.  Elevated D dimer.  CT C/A/P notes 9cm left sided intraperitoneal mass with central fluid & gas.  Associated with SB ?ileum.  D/w Dr Cherly Hensen with radiology.  Suspicion for SB lymphoma vs melanoma.  Not at terminal ileum.  Not c/w Crohn's.  Surgical consultation requested.  Dr. Daun Peacock, emergency room nurse, spouse at bedside.  Patient anxious but consolable.  Still with some abdominal discomfort and nausea but improved overall.  No personal nor family history of GI/colon cancer, inflammatory bowel disease, irritable bowel syndrome, allergy such as Celiac Sprue, dietary/dairy problems, colitis, ulcers nor gastritis.  No recent sick contacts/gastroenteritis.  No travel outside the country.  No changes in diet.  No dysphagia to solids or liquids.  No significant heartburn or reflux.  Very mild heartburn to tomatoes and citrus controlled with Tums easily.  No history of hepatitis or pancreatitis.  No history of lymphoma.  No history of melanoma or other  abnormal masses.  No hematochezia, hematemesis, coffee ground emesis.  No evidence of prior gastric/peptic ulceration.    History reviewed. No pertinent past medical history.  Past Surgical History  Procedure Laterality Date  . Wisdom tooth extraction    . Cervical polypectomy  2010  . Bartholin gland cyst excision  01/17/2009    I&D    History   Social History  . Marital Status: Married    Spouse Name: N/A  . Number of Children: N/A  . Years of Education: N/A   Occupational History  . Not on file.   Social History Main Topics  . Smoking status: Never Smoker   . Smokeless tobacco: Never Used  . Alcohol Use: Yes     Comment: socially  . Drug Use: No  . Sexual Activity: Yes    Birth Control/ Protection: Condom, Other-see comments     Comment: pt use gel    Other Topics Concern  . Not on file   Social History Narrative    Family History  Problem Relation Age of Onset  . Heart disease Father   . Hypertension Father   . Other Mother     varicose veins  . Cancer Mother   . Hypertension Mother   . Diabetes Mother   . Breast cancer Mother 81  . Lupus Cousin     Current Facility-Administered Medications  Medication Dose Route Frequency Provider Last Rate Last Dose  . acetaminophen (TYLENOL) suppository 650 mg  650 mg Rectal Q6H PRN Karie Soda, MD      . acetaminophen (TYLENOL) tablet [phone]  mg  325-650 mg Oral Q6H PRN Michael Boston, MD      . alum & mag hydroxide-simeth (MAALOX/MYLANTA) 200-200-20 MG/5ML suspension 30 mL  30 mL Oral Q6H PRN Michael Boston, MD      . bisacodyl (DULCOLAX) suppository 10 mg  10 mg Rectal Q12H PRN Michael Boston, MD      . dextrose 5 % and 0.45 % NaCl with KCl 40 mEq/L infusion   Intravenous Continuous Michael Boston, MD      . diphenhydrAMINE (BENADRYL) injection 12.5-25 mg  12.5-25 mg Intravenous Q6H PRN Michael Boston, MD      . fentaNYL (SUBLIMAZE) injection 50 mcg  50 mcg Intravenous Once April Palumbo, MD      . gi cocktail  (Maalox,Lidocaine,Donnatal)  30 mL Oral Once April Palumbo, MD      . HYDROmorphone (DILAUDID) injection 0.5-2 mg  0.5-2 mg Intravenous Q1H PRN Michael Boston, MD      . lactated ringers bolus 1,000 mL  1,000 mL Intravenous Q8H PRN Michael Boston, MD      . lip balm (CARMEX) ointment 1 application  1 application Topical BID Michael Boston, MD      . LORazepam (ATIVAN) injection 0.5-1 mg  0.5-1 mg Intravenous Q8H PRN Michael Boston, MD      . magic mouthwash  15 mL Oral QID PRN Michael Boston, MD      . menthol-cetylpyridinium (CEPACOL) lozenge 3 mg  1 lozenge Oral PRN Michael Boston, MD      . ondansetron [facility]) injection 4 mg  4 mg Intravenous Q6H PRN Michael Boston, MD       Or  . ondansetron (ZOFRAN) 8 mg/NS 50 ml IVPB  8 mg Intravenous Q6H PRN Michael Boston, MD      . phenol (CHLORASEPTIC) mouth spray 2 spray  2 spray Mouth/Throat PRN Michael Boston, MD      . promethazine (PHENERGAN) injection 6.25-12.5 mg  6.25-12.5 mg Intravenous Q4H PRN Michael Boston, MD      . saccharomyces boulardii (FLORASTOR) capsule 250 mg  250 mg Oral BID Michael Boston, MD      . sodium chloride 0.9 % bolus 1,000 mL  1,000 mL Intravenous Once April Palumbo, MD      . zolpidem (AMBIEN) tablet 5-10 mg  5-10 mg Oral QHS PRN Michael Boston, MD       Current Outpatient Prescriptions  Medication Sig Dispense Refill  . citalopram (CELEXA) 10 MG tablet Take 10 mg by mouth daily.    Marland Kitchen ibuprofen (ADVIL,MOTRIN) 200 MG tablet Take 400 mg by mouth every 6 (six) hours as needed for moderate pain.    . Multiple Vitamin (MULTIVITAMIN) tablet Take 1 tablet by mouth daily.       Allergies  Allergen Reactions  . Sulfa Antibiotics Hives    ROS: Constitutional:  No fevers, chills.  +sweats.  Weight stable Eyes:  No vision changes, No discharge HENT:  No sore throats, nasal drainage Lymph: No neck swelling, No bruising easily Pulmonary:  No cough, productive sputum CV: No orthopnea, PND  Patient walks 60 minutes for about 2 miles without  difficulty.  No exertional chest/neck/shoulder/arm pain. GI: No personal nor family history of GI/colon cancer, inflammatory bowel disease, irritable bowel syndrome, allergy such as Celiac Sprue, dietary/dairy problems, colitis, ulcers nor gastritis.  No recent sick contacts/gastroenteritis.  No travel outside the country.  No changes in diet. Renal: No UTIs, No hematuria Genital:  No drainage, bleeding, masses Musculoskeletal: No severe joint pain.  Good  ROM major joints Skin:  No sores or lesions.  No rashes Heme/Lymph:  No easy bleeding.  No swollen lymph nodes Neuro: No focal weakness/numbness.  No seizures Psych: No suicidal ideation.  No hallucinations  BP 93/46 mmHg  Pulse 108  Temp(Src) 99.4 F (37.4 C) (Oral)  Resp 16  Ht $R'5\' 3"'ol$  (1.6 m)  Wt 74.844 kg (165 lb)  BMI 29.24 kg/m2  SpO2 98%  LMP 05/23/2014  Physical Exam: General: Pt awake/alert/oriented x4 in mild major acute distress.  Moves slowly but rather easily Eyes: PERRL, normal EOM. Sclera nonicteric Neuro: CN II-XII intact w/o focal sensory/motor deficits. Lymph: No head/neck/groin lymphadenopathy Psych:  No delerium/psychosis/paranoia.  Mildly anxious but consolable. HENT: Normocephalic, Mucus membranes moist.  No thrush Neck: Supple, No tracheal deviation Chest: No pain.  Good respiratory excursion. CV:  Pulses intact.  Regular rhythm.  Heart rate 100 to 110s. Abdomen: Soft, Nondistended.  Mild tenderness to patient and left abdomen periumbilical.  No guarding.  No rebound tenderness.  No peritonitis.  No Murphy sign.  No diastases.  No umbilical hernias.   Genital:  No vaginal bleeding or discharge.  No internal hernias. Rectal:  Being performed by emergency room doctor.  Please see their notes.  No bright red blood per rectum. Ext:  SCDs BLE.  No significant edema.  No cyanosis Skin: No petechiae / purpurea.  No major sores.  No obvious abnormal moles or skin masses noted Musculoskeletal: No severe joint pain.   Good ROM major joints   Results:   Labs: Results for orders placed or performed during the hospital encounter of 06/06/14 (from the past 48 hour(s))  CBC     Status: Abnormal   Collection Time: 06/06/14 11:42 PM  Result Value Ref Range   WBC 5.4 4.0 - 10.5 K/uL   RBC 4.64 3.87 - 5.11 MIL/uL   Hemoglobin 10.5 (L) 12.0 - 15.0 g/dL   HCT 34.8 (L) 36.0 - 46.0 %   MCV 75.0 (L) 78.0 - 100.0 fL   MCH 22.6 (L) 26.0 - 34.0 pg   MCHC 30.2 30.0 - 36.0 g/dL   RDW 16.4 (H) 11.5 - 15.5 %   Platelets 334 150 - 400 K/uL  Basic metabolic panel     Status: Abnormal   Collection Time: 06/06/14 11:42 PM  Result Value Ref Range   Sodium 139 135 - 145 mmol/L   Potassium 3.9 3.5 - 5.1 mmol/L   Chloride 102 96 - 112 mmol/L   CO2 21 19 - 32 mmol/L   Glucose, Bld 107 (H) 70 - 99 mg/dL   BUN 20 6 - 23 mg/dL   Creatinine, Ser 0.98 0.50 - 1.10 mg/dL   Calcium 9.7 8.4 - 10.5 mg/dL   GFR calc non Af Amer 69 (L) >90 mL/min   GFR calc Af Amer 80 (L) >90 mL/min    Comment: (NOTE) The eGFR has been calculated using the CKD EPI equation. This calculation has not been validated in all clinical situations. eGFR's persistently <90 mL/min signify possible Chronic Kidney Disease.    Anion gap 16 (H) 5 - 15  I-stat troponin, ED (not at [facility])     Status: None   Collection Time: 06/06/14 11:48 PM  Result Value Ref Range   Troponin i, poc 0.00 0.00 - 0.08 ng/mL   Comment 3            Comment: Due to the release kinetics of cTnI, a negative result within the first hours of the  onset of symptoms does not rule out myocardial infarction with certainty. If myocardial infarction is still suspected, repeat the test at appropriate intervals.   I-Stat Chem 8, ED     Status: Abnormal   Collection Time: 06/07/14 12:01 AM  Result Value Ref Range   Sodium 140 135 - 145 mmol/L   Potassium 3.9 3.5 - 5.1 mmol/L   Chloride 102 96 - 112 mmol/L   BUN 21 6 - 23 mg/dL   Creatinine, Ser 0.90 0.50 - 1.10 mg/dL   Glucose, Bld  106 (H) 70 - 99 mg/dL   Calcium, Ion 1.20 1.12 - 1.23 mmol/L   TCO2 20 0 - 100 mmol/L   Hemoglobin 12.9 12.0 - 15.0 g/dL   HCT 38.0 36.0 - 46.0 %  Lipase, blood     Status: None   Collection Time: 06/07/14 12:46 AM  Result Value Ref Range   Lipase 20 11 - 59 U/L  Hepatic function panel     Status: Abnormal   Collection Time: 06/07/14 12:46 AM  Result Value Ref Range   Total Protein 7.8 6.0 - 8.3 g/dL   Albumin 3.9 3.5 - 5.2 g/dL   AST 44 (H) 0 - 37 U/L   ALT 31 0 - 35 U/L   Alkaline Phosphatase 207 (H) 39 - 117 U/L   Total Bilirubin 0.6 0.3 - 1.2 mg/dL   Bilirubin, Direct 0.2 0.0 - 0.5 mg/dL   Indirect Bilirubin 0.4 0.3 - 0.9 mg/dL  D-dimer, quantitative     Status: Abnormal   Collection Time: 06/07/14 12:59 AM  Result Value Ref Range   D-Dimer, Quant >20.00 (H) 0.00 - 0.48 ug/mL-FEU    Comment:        AT THE INHOUSE ESTABLISHED CUTOFF VALUE OF 0.48 ug/mL FEU, THIS ASSAY HAS BEEN DOCUMENTED IN THE LITERATURE TO HAVE A SENSITIVITY AND NEGATIVE PREDICTIVE VALUE OF AT LEAST 98 TO 99%.  THE TEST RESULT SHOULD BE CORRELATED WITH AN ASSESSMENT OF THE CLINICAL PROBABILITY OF DVT / VTE. SPECIMEN CHECKED FOR CLOTS REPEATED TO VERIFY   POC urine preg, ED (not at [facility])     Status: None   Collection Time: 06/07/14  1:27 AM  Result Value Ref Range   Preg Test, Ur NEGATIVE NEGATIVE    Comment:        THE SENSITIVITY OF THIS METHODOLOGY IS >24 mIU/mL     Imaging / Studies: Dg Chest Port 1 View  06/07/2014   CLINICAL DATA:  Acute onset of chest pain and shortness of breath. Back pain for 1 day. Initial encounter.  EXAM: PORTABLE CHEST - 1 VIEW  COMPARISON:  None.  FINDINGS: The lungs are well-aerated. Pulmonary vascularity is at the upper limits of normal. There is no evidence of focal opacification, pleural effusion or pneumothorax.  The cardiomediastinal silhouette is within normal limits. No acute osseous abnormalities are seen.  IMPRESSION: No acute cardiopulmonary process seen.    Electronically Signed   By: Garald Balding M.D.   On: 06/07/2014 01:20    Medications / Allergies: per chart  Antibiotics: Anti-infectives    None      Assessment  Miranda Howe  44 y.o. female       Problem List:  Active Problems:   Abdominal mass, LLQ (left lower quadrant)   Anxiety   Mass of small intestine   Large intra-abdominal mass associated with small intestine in the setting of worsening constipation bowel changes and now significant nausea and vomiting.  Suspicious for  lymphoma or melanoma according to radiology.  Definitely atypical.  No major transition point but suspect that this provides intermittent obstruction.  Central area of fluid and gas suspicious for contained perforation versus central necrosis.  Plan:  Admit.  Aggressive IV fluids.  IV antibiotics.  Abdominal exploration.  Because she has had obstructive symptoms and has some evidence of early shock, I do not think this can be electively managed.   I think she is coming in dehydrated.  Improved with IV fluids.  Give more.  She does not have peritonitis, but the recent sharp pain and narcotics may mask this somewhat..  I believe that surgery would be best managed in a controlled setting with operation in the morning with the day shift.  Mass will need to be removed en bloc so therefore most likely open incision with open exploration to remove large mass.  Probable small bowel resection.  Because its within the small intestine, most likely can do anastomosis and avoid ostomy.  Often has to be a judgment call in the operating room.  No other evidence of frank metastatic disease.  Discussed at length with the patient and her husband.  Did this in the presence of the emergency room nurse and emergency room doctor.  Patient is motivated to "get this done".  Would like to have a chance to better resuscitate her and do surgery in a few hours under better conditions.  The anatomy & physiology of the  digestive tract was discussed.  The pathophysiology of perforation was discussed.  Differential diagnosis such as perforated ulcer or colon, etc was discussed.   Natural history risks without surgery such as death was discussed.  I recommended abdominal exploration to diagnose & treat the source of the problem.  Laparoscopic & open techniques were discussed.   Risks such as bleeding, infection, abscess, leak, reoperation, bowel resection, possible ostomy, hernia, heart attack, death, and other risks were discussed.   The risks of no intervention will lead to serious problems including death.   I expressed a good likelihood that surgery will address the problem.    Goals of post-operative recovery were discussed as well.  We will work to minimize complications although risks in an emergent setting are high.   Questions were answered.  The patient expressed understanding & wishes to proceed with surgery.      VTE prophylaxis- SCDs, etc.  hold off on anticoagulation until after surgery  Anxiolysis.  She seems to be consoled and reassured with support from staff and husband.  She can have Ativan when necessary as backup.  Mobilize as tolerated to help recovery    Adin Hector, M.D., F.A.C.S. Gastrointestinal and Minimally Invasive Surgery [facility] [address] [place], [place] [address] [phone] Main / Paging   06/07/2014  Note: Portions of this report may have been transcribed using voice recognition software. Every effort was made to ensure accuracy; however, inadvertent computerized transcription errors may be present.   Any transcriptional errors that result from this process are unintentional.

## 2014-06-07 NOTE — Anesthesia Procedure Notes (Signed)
Procedure Name: Intubation Date/Time: 06/07/2014 12:45 PM Performed by: Danley Danker L Patient Re-evaluated:Patient Re-evaluated prior to inductionOxygen Delivery Method: Circle system utilized Preoxygenation: Pre-oxygenation with 100% oxygen Intubation Type: IV induction and Cricoid Pressure applied Laryngoscope Size: Miller and 3 Grade View: Grade I Tube type: Oral Tube size: 7.5 mm Number of attempts: 1 Airway Equipment and Method: Stylet Placement Confirmation: ETT inserted through vocal cords under direct vision,  breath sounds checked- equal and bilateral and positive ETCO2 Secured at: 20 cm Tube secured with: Tape

## 2014-06-07 NOTE — Transfer of Care (Signed)
Immediate Anesthesia Transfer of Care Note  Patient: Miranda Howe  Procedure(s) Performed: Procedure(s): EXPLORATORY LAPAROTOMY RESECTION OF ABDOMINAL MASS AND PORTION OF SMALL BOWEL (N/A)  Patient Location: PACU  Anesthesia Type:General  Level of Consciousness: awake and oriented  Airway & Oxygen Therapy: Patient Spontanous Breathing and Patient connected to face mask oxygen  Post-op Assessment: Report given to RN and Post -op Vital signs reviewed and stable  Post vital signs: Reviewed and stable  Last Vitals:  Filed Vitals:   06/07/14 1057  BP: 80/48  Pulse: 92  Temp:   Resp: 18    Complications: No apparent anesthesia complications

## 2014-06-07 NOTE — ED Notes (Signed)
Pt alert and oriented. Pt reports she feels better.

## 2014-06-07 NOTE — Progress Notes (Signed)
Patient ID: Miranda Howe, female   DOB: 1970/05/02, 44 y.o.   MRN: [id number]    Subjective: Feeling better currently  Objective: Vital signs in last 24 hours: Temp:  [99.4 F (37.4 C)] 99.4 F (37.4 C) (03/15 2335) Pulse Rate:  [100-126] 102 (03/16 0737) Resp:  [16-31] 18 (03/16 0737) BP: (73-131)/(34-94) 74/42 mmHg (03/16 0749) SpO2:  [93 %-100 %] 93 % (03/16 0600) Weight:  [165 lb (74.844 kg)] 165 lb (74.844 kg) (03/15 2322)    Intake/Output from previous day:   Intake/Output this shift:    General appearance: alert, cooperative and no distress GI: normal findings: soft, non-tender  Lab Results:   Recent Labs  06/06/14 2342 06/07/14 0001  WBC 5.4  --   HGB 10.5* 12.9  HCT 34.8* 38.0  PLT 334  --    BMET  Recent Labs  06/06/14 2342 06/07/14 0001  NA 139 140  K 3.9 3.9  CL 102 102  CO2 21  --   GLUCOSE 107* 106*  BUN 20 21  CREATININE 0.98 0.90  CALCIUM 9.7  --      Studies/Results: Dg Chest Port 1 View  06/07/2014   CLINICAL DATA:  Acute onset of chest pain and shortness of breath. Back pain for 1 day. Initial encounter.  EXAM: PORTABLE CHEST - 1 VIEW  COMPARISON:  None.  FINDINGS: The lungs are well-aerated. Pulmonary vascularity is at the upper limits of normal. There is no evidence of focal opacification, pleural effusion or pneumothorax.  The cardiomediastinal silhouette is within normal limits. No acute osseous abnormalities are seen.  IMPRESSION: No acute cardiopulmonary process seen.   Electronically Signed   By: Garald Balding M.D.   On: 06/07/2014 01:20   Ct Angio Chest Aorta W/cm &/or Wo/cm  06/07/2014   CLINICAL DATA:  Acute onset of chest pain for 2 hours, radiating to the back. Nausea, vomiting, shortness of breath and diaphoresis. Elevated D-dimer. Arm tingling and leg weakness. Initial encounter.  EXAM: CT ANGIOGRAPHY CHEST, ABDOMEN AND PELVIS  TECHNIQUE: Multidetector CT imaging through the chest, abdomen and pelvis was performed using  the standard protocol during bolus administration of intravenous contrast. Multiplanar reconstructed images and MIPs were obtained and reviewed to evaluate the vascular anatomy.  CONTRAST:  127mL OMNIPAQUE IOHEXOL 350 MG/ML SOLN  COMPARISON:  None.  FINDINGS: CTA CHEST FINDINGS  There is no evidence of aortic dissection. There is no evidence of aneurysmal dilatation. No calcific atherosclerotic disease is seen. The great vessels are unremarkable in appearance.  There is no evidence of pulmonary embolus.  Minimal bibasilar atelectasis is noted. Minimal nodularity at the lung apices likely also reflects atelectasis, though minimal infection might have a similar appearance. There is no evidence of pleural effusion or pneumothorax. No masses are identified; no abnormal focal contrast enhancement is seen.  The mediastinum is unremarkable in appearance. A tiny calcified node is seen adjacent to the distal esophagus. There is question of mild distal esophageal wall thickening, which could reflect minimal esophagitis, or could remain within normal limits. No pericardial effusion is identified. No axillary lymphadenopathy is seen. The visualized portions of the thyroid gland are unremarkable in appearance.  Mild nodularity to the left fibroglandular tissue is thought to remain within normal limits. No suspicious soft tissue masses are seen within the chest.  No acute osseous abnormalities are seen.  Review of the MIP images confirms the above findings.  CTA ABDOMEN AND PELVIS FINDINGS  There is no evidence of aortic dissection. There  is no evidence of aneurysmal dilatation. No calcific atherosclerotic disease is seen. Incidental note is made of a circumaortic left renal vein. The celiac trunk, superior mesenteric artery, bilateral renal arteries and inferior mesenteric artery appear patent. The inferior vena cava is unremarkable in appearance.  There is a large complex mass at the left lower quadrant. It measures  approximately 9.3 x 8.4 x 7.5 cm, with a large amount of fluid and air noted centrally, and a diffusely irregular peripheral soft tissue border. Mild surrounding soft tissue inflammation is seen. This is suggested to be contiguous with the adjacent mid jejunum.  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 Jejunal adenocarcinoma can mimic this appearance, and metastatic disease as might be seen with melanoma can also have such an appearance, though there are no additional findings seen to suggest melanoma.  A centrally necrotic tumor is thought to be less likely, given the relatively typical appearance for small bowel lymphoma. A walled-off abscess is also very unlikely, given recruitment of vasculature surrounding the mass, adjacent mild nodularity overlying the descending colon, and a few prominent associated mesenteric nodes measuring up to 1.0 cm in size (best seen on coronal images).  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 However, the cecum and appendix are still noted on the right side.  There is a somewhat heterogeneous appearance to hepatic enhancement, which may reflect underlying fatty infiltration or mild venous congestion. No definite focal mass is seen. The spleen is unremarkable in appearance. The gallbladder is within normal limits. Hepatic hilar nodes remain normal in size. The pancreas and adrenal glands are unremarkable.  A 1.0 cm cyst is noted at the upper pole of the right kidney. The kidneys are otherwise unremarkable. There is no evidence of hydronephrosis. No renal or ureteral stones are seen. No perinephric stranding is appreciated.  Trace fluid within the pelvis may be physiologic in nature, or could be related to the left lower quadrant abdominal mass. The stomach is within normal limits.  The appendix is normal in caliber, without evidence for  appendicitis. The colon is partially filled with stool and is unremarkable in appearance, aside from mild adjacent nodularity along the descending colon as described above, at the level of the mass.  The bladder is mildly distended and grossly unremarkable. The uterus has a somewhat heterogeneous appearance, but remains normal in size. This may reflect multiple tiny fibroids. The ovaries are relatively symmetric. No suspicious adnexal masses are seen. No inguinal lymphadenopathy is seen.  No acute osseous abnormalities are identified.  Review of the MIP images confirms the above findings.  IMPRESSION: 1. No evidence of aortic dissection. No evidence of aneurysmal dilatation. No calcific atherosclerotic disease seen. 2. Large complex 9.3 x 8.4 x 7.5 cm mass noted at the left lower quadrant. It contains a large amount of fluid and air, with a diffusely irregular peripheral soft tissue or. Mild surrounding soft tissue inflammation seen. This is suggested to be contiguous with the adjacent mid ileum. 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 Jejunal adenocarcinoma can mimic this appearance, and metastatic disease as might be seen with melanoma can also have such an appearance, though there are no additional findings to suggest melanoma. 3. There is recruitment of vasculature about the mass, mild nodularity overlying the adjacent descending colon, and a few prominent  associated mesenteric nodes. These findings are highly suggestive of malignancy. 4. 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 However, the cecum and appendix are still seen on the right side. 5. Minimal nodularity at the lung apices likely reflects atelectasis, though minimal infection might have a similar appearance. Minimal bibasilar atelectasis noted. 6. Suggestion of mild distal esophageal wall thickening,  which could reflect minimal esophagitis, or could remain within normal limits. 7. Circumaortic left renal vein incidentally noted. 8. Somewhat heterogeneous appearance to hepatic enhancement. This may reflect underlying fatty infiltration or mild venous congestion. No focal mass seen. 9. Small right renal cyst noted. 10. Trace free fluid within the pelvis may be physiologic in nature, or could be related to the left lower quadrant abdominal mass. 11. Heterogeneous appearance to the uterus may reflect multiple tiny fibroids.  These results were called by telephone at the time of interpretation on 06/07/2014 at 2:58 am to Dr. Veatrice Kells, who verbally acknowledged these results.   Electronically Signed   By: Garald Balding M.D.   On: 06/07/2014 03:55   Ct Angio Abd/pel W/ And/or W/o  06/07/2014   CLINICAL DATA:  Acute onset of chest pain for 2 hours, radiating to the back. Nausea, vomiting, shortness of breath and diaphoresis. Elevated D-dimer. Arm tingling and leg weakness. Initial encounter.  EXAM: CT ANGIOGRAPHY CHEST, ABDOMEN AND PELVIS  TECHNIQUE: Multidetector CT imaging through the chest, abdomen and pelvis was performed using the standard protocol during bolus administration of intravenous contrast. Multiplanar reconstructed images and MIPs were obtained and reviewed to evaluate the vascular anatomy.  CONTRAST:  163mL OMNIPAQUE IOHEXOL 350 MG/ML SOLN  COMPARISON:  None.  FINDINGS: CTA CHEST FINDINGS  There is no evidence of aortic dissection. There is no evidence of aneurysmal dilatation. No calcific atherosclerotic disease is seen. The great vessels are unremarkable in appearance.  There is no evidence of pulmonary embolus.  Minimal bibasilar atelectasis is noted. Minimal nodularity at the lung apices likely also reflects atelectasis, though minimal infection might have a similar appearance. There is no evidence of pleural effusion or pneumothorax. No masses are identified; no abnormal focal contrast  enhancement is seen.  The mediastinum is unremarkable in appearance. A tiny calcified node is seen adjacent to the distal esophagus. There is question of mild distal esophageal wall thickening, which could reflect minimal esophagitis, or could remain within normal limits. No pericardial effusion is identified. No axillary lymphadenopathy is seen. The visualized portions of the thyroid gland are unremarkable in appearance.  Mild nodularity to the left fibroglandular tissue is thought to remain within normal limits. No suspicious soft tissue masses are seen within the chest.  No acute osseous abnormalities are seen.  Review of the MIP images confirms the above findings.  CTA ABDOMEN AND PELVIS FINDINGS  There is no evidence of aortic dissection. There is no evidence of aneurysmal dilatation. No calcific atherosclerotic disease is seen. Incidental note is made of a circumaortic left renal vein. The celiac trunk, superior mesenteric artery, bilateral renal arteries and inferior mesenteric artery appear patent. The inferior vena cava is unremarkable in appearance.  There is a large complex mass at the left lower quadrant. It measures approximately 9.3 x 8.4 x 7.5 cm, with a large amount of fluid and air noted centrally, and a diffusely irregular peripheral soft tissue border. Mild surrounding soft tissue inflammation is seen. This is suggested to be contiguous with the adjacent mid jejunum.  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 Jejunal adenocarcinoma can mimic this appearance, and metastatic disease as might be seen with melanoma can also have such an appearance, though there are no additional findings seen to suggest melanoma.  A centrally necrotic tumor is thought to be less likely, given the relatively typical appearance for small bowel lymphoma. A walled-off abscess is also very unlikely, given recruitment of vasculature surrounding the  mass, adjacent mild nodularity overlying the descending colon, and a few prominent associated mesenteric nodes measuring up to 1.0 cm in size (best seen on coronal images).  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 However, the cecum and appendix are still noted on the right side.  There is a somewhat heterogeneous appearance to hepatic enhancement, which may reflect underlying fatty infiltration or mild venous congestion. No definite focal mass is seen. The spleen is unremarkable in appearance. The gallbladder is within normal limits. Hepatic hilar nodes remain normal in size. The pancreas and adrenal glands are unremarkable.  A 1.0 cm cyst is noted at the upper pole of the right kidney. The kidneys are otherwise unremarkable. There is no evidence of hydronephrosis. No renal or ureteral stones are seen. No perinephric stranding is appreciated.  Trace fluid within the pelvis may be physiologic in nature, or could be related to the left lower quadrant abdominal mass. The stomach is within normal limits.  The appendix is normal in caliber, without evidence for appendicitis. The colon is partially filled with stool and is unremarkable in appearance, aside from mild adjacent nodularity along the descending colon as described above, at the level of the mass.  The bladder is mildly distended and grossly unremarkable. The uterus has a somewhat heterogeneous appearance, but remains normal in size. This may reflect multiple tiny fibroids. The ovaries are relatively symmetric. No suspicious adnexal masses are seen. No inguinal lymphadenopathy is seen.  No acute osseous abnormalities are identified.  Review of the MIP images confirms the above findings.  IMPRESSION: 1. No evidence of aortic dissection. No evidence of aneurysmal dilatation. No calcific atherosclerotic disease seen. 2. Large complex 9.3 x 8.4 x 7.5 cm mass noted at the left lower quadrant. It  contains a large amount of fluid and air, with a diffusely irregular peripheral soft tissue or. Mild surrounding soft tissue inflammation seen. This is suggested to be contiguous with the adjacent mid ileum. 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 Jejunal adenocarcinoma can mimic this appearance, and metastatic disease as might be seen with melanoma can also have such an appearance, though there are no additional findings to suggest melanoma. 3. There is recruitment of vasculature about the mass, mild nodularity overlying the adjacent descending colon, and a few prominent associated mesenteric nodes. These findings are highly suggestive of malignancy. 4. 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 However, the cecum and appendix are still seen on the right side. 5. Minimal nodularity at the lung apices likely reflects atelectasis, though minimal infection might have a similar appearance. Minimal bibasilar atelectasis noted. 6. Suggestion of mild distal esophageal wall thickening, which could reflect minimal esophagitis, or could remain within normal limits. 7. Circumaortic left renal vein incidentally noted. 8. Somewhat heterogeneous appearance to hepatic enhancement. This may reflect underlying fatty infiltration or mild venous congestion.  No focal mass seen. 9. Small right renal cyst noted. 10. Trace free fluid within the pelvis may be physiologic in nature, or could be related to the left lower quadrant abdominal mass. 11. Heterogeneous appearance to the uterus may reflect multiple tiny fibroids.  These results were called by telephone at the time of interpretation on 06/07/2014 at 2:58 am to Dr. Veatrice Kells, who verbally acknowledged these results.   Electronically Signed   By: Garald Balding M.D.   On: 06/07/2014 03:55    Anti-infectives: Anti-infectives     Start     Dose/Rate Route Frequency Ordered Stop   06/07/14 0600  cefoTEtan (CEFOTAN) 2 g in dextrose 5 % 50 mL IVPB    Comments:  Pharmacy may adjust dose strength for optimal dosing.   Send with patient on call to the OR.  Anesthesia to complete antibiotic administration <47min prior to incision per [facility].   2 g 100 mL/hr over 30 Minutes Intravenous On call to O.R. 06/07/14 0438 06/08/14 0559   06/07/14 0600  metroNIDAZOLE (FLAGYL) IVPB 500 mg     500 mg 100 mL/hr over 60 Minutes Intravenous Every 6 hours 06/07/14 0438     06/07/14 0500  cefTRIAXone (ROCEPHIN) 2 g in dextrose 5 % 50 mL IVPB    Comments:  Pharmacy may adjust dosing strength / duration / interval for maximal efficacy   2 g 100 mL/hr over 30 Minutes Intravenous Every 24 hours 06/07/14 0438        Assessment/Plan: Large small bowel mass as above with acute severe chest pain, CT chest neg for PE.  Possible early sepsis. Giving fluid bolus and increased IVF.  On IV abx Agree with Dr Clyda Greener note.  Plan urgent exp lap & SB resection.  Discussed with pt including risks of anesthesia, bleeding, infection, anastamotic leak.  She understands and agrees to proceed.    LOS: 0 days    Winfred Redel T 06/07/2014

## 2014-06-07 NOTE — ED Notes (Signed)
Pt ambulated to restroom  With steady gait. Pt husband taking all belongings with him.

## 2014-06-08 ENCOUNTER — Encounter (HOSPITAL_COMMUNITY): Payer: Self-pay | Admitting: Anesthesiology

## 2014-06-08 ENCOUNTER — Encounter (HOSPITAL_COMMUNITY): Payer: Self-pay | Admitting: General Surgery

## 2014-06-08 DIAGNOSIS — I9581 Postprocedural hypotension: Secondary | ICD-10-CM

## 2014-06-08 DIAGNOSIS — I959 Hypotension, unspecified: Secondary | ICD-10-CM | POA: Diagnosis present

## 2014-06-08 LAB — CBC
HCT: 24.1 % — ABNORMAL LOW (ref 36.0–46.0)
HCT: 24.2 % — ABNORMAL LOW (ref 36.0–46.0)
HEMOGLOBIN: 7.4 g/dL — AB (ref 12.0–15.0)
Hemoglobin: 7.4 g/dL — ABNORMAL LOW (ref 12.0–15.0)
MCH: 23.1 pg — ABNORMAL LOW (ref 26.0–34.0)
MCH: 22.8 pg — ABNORMAL LOW (ref 26.0–34.0)
MCHC: 30.7 g/dL (ref 30.0–36.0)
MCHC: 30.6 g/dL (ref 30.0–36.0)
MCV: 75.1 fL — AB (ref 78.0–100.0)
MCV: 74.7 fL — ABNORMAL LOW (ref 78.0–100.0)
Platelets: 199 10*3/uL (ref 150–400)
Platelets: 212 10*3/uL (ref 150–400)
RBC: 3.21 MIL/uL — AB (ref 3.87–5.11)
RBC: 3.24 MIL/uL — ABNORMAL LOW (ref 3.87–5.11)
RDW: 17 % — ABNORMAL HIGH (ref 11.5–15.5)
RDW: 17.1 % — ABNORMAL HIGH (ref 11.5–15.5)
WBC: 21.9 10*3/uL — ABNORMAL HIGH (ref 4.0–10.5)
WBC: 24.6 10*3/uL — ABNORMAL HIGH (ref 4.0–10.5)

## 2014-06-08 LAB — TROPONIN I: TROPONIN I: 0.04 ng/mL — AB (ref <0.031)

## 2014-06-08 LAB — BASIC METABOLIC PANEL
ANION GAP: 9 (ref 5–15)
Anion gap: 8 (ref 5–15)
BUN: 19 mg/dL (ref 6–23)
BUN: 24 mg/dL — AB (ref 6–23)
CALCIUM: 7.3 mg/dL — AB (ref 8.4–10.5)
CO2: 23 mmol/L (ref 19–32)
CO2: 23 mmol/L (ref 19–32)
CREATININE: 0.86 mg/dL (ref 0.50–1.10)
CREATININE: 0.96 mg/dL (ref 0.50–1.10)
Calcium: 7.5 mg/dL — ABNORMAL LOW (ref 8.4–10.5)
Chloride: 109 mmol/L (ref 96–112)
Chloride: 111 mmol/L (ref 96–112)
GFR calc Af Amer: >90 mL/min (ref >90)
GFR calc non Af Amer: 71 mL/min — ABNORMAL LOW (ref >90)
GFR, EST AFRICAN AMERICAN: 82 mL/min — AB (ref >90)
GFR, EST NON AFRICAN AMERICAN: 81 mL/min — AB (ref >90)
GLUCOSE: 108 mg/dL — AB (ref 70–99)
Glucose, Bld: 132 mg/dL — ABNORMAL HIGH (ref 70–99)
POTASSIUM: 3.9 mmol/L (ref 3.5–5.1)
Potassium: 3.8 mmol/L (ref 3.5–5.1)
SODIUM: 141 mmol/L (ref 135–145)
Sodium: 142 mmol/L (ref 135–145)

## 2014-06-08 LAB — MAGNESIUM: MAGNESIUM: 1.6 mg/dL (ref 1.5–2.5)

## 2014-06-08 LAB — PROCALCITONIN: Procalcitonin: 62 ng/mL

## 2014-06-08 LAB — LACTIC ACID, PLASMA
Lactic Acid, Venous: 1.5 mmol/L (ref 0.5–2.0)
Lactic Acid, Venous: 1.8 mmol/L (ref 0.5–2.0)

## 2014-06-08 LAB — PHOSPHORUS: PHOSPHORUS: 3.4 mg/dL (ref 2.3–4.6)

## 2014-06-08 MED ORDER — SODIUM CHLORIDE 0.9 % IV BOLUS (SEPSIS)
500.0000 mL | Freq: Once | INTRAVENOUS | Status: AC
  Administered 2014-06-08: 500 mL via INTRAVENOUS

## 2014-06-08 MED ORDER — SODIUM CHLORIDE 0.9 % IV BOLUS (SEPSIS)
1000.0000 mL | Freq: Once | INTRAVENOUS | Status: AC
  Administered 2014-06-08: 1000 mL via INTRAVENOUS

## 2014-06-08 MED ORDER — PANTOPRAZOLE SODIUM 40 MG IV SOLR
40.0000 mg | INTRAVENOUS | Status: DC
  Administered 2014-06-08 – 2014-06-12 (×5): 40 mg via INTRAVENOUS
  Filled 2014-06-08 (×6): qty 40

## 2014-06-08 MED ORDER — ACETAMINOPHEN 10 MG/ML IV SOLN
1000.0000 mg | Freq: Four times a day (QID) | INTRAVENOUS | Status: AC
  Administered 2014-06-08 – 2014-06-09 (×4): 1000 mg via INTRAVENOUS
  Filled 2014-06-08 (×4): qty 100

## 2014-06-08 MED ORDER — SODIUM CHLORIDE 0.9 % IV BOLUS (SEPSIS)
750.0000 mL | Freq: Once | INTRAVENOUS | Status: AC
  Administered 2014-06-08: 750 mL via INTRAVENOUS

## 2014-06-08 NOTE — Progress Notes (Signed)
Pt with bradycardia 39-50s per montior, BP low 92/56 and has had 50cc uo in past 2 hrs. Pt currently asymptomatic and MD on call made aware. Will await orders and implement once given. Will continue to monitor patient's condition.

## 2014-06-08 NOTE — Care Management Note (Signed)
CARE MANAGEMENT NOTE 06/08/2014  Patient:  Miranda Howe, Miranda Howe   Account Number:  [id number]  Date Initiated:  06/08/2014  Documentation initiated by:  DAVIS,RHONDA  Subjective/Objective Assessment:   bowel resection due to mass, sepsis     Action/Plan:   home when stable   Anticipated DC Date:  06/11/2014   Anticipated DC Plan:  HOME/SELF CARE  In-house referral  NA      DC Planning Services  CM consult      PAC Choice  NA   Choice offered to / List presented to:  NA           Status of service:  In process, will continue to follow Medicare Important Message given?   (If response is "NO", the following Medicare IM given date fields will be blank) Date Medicare IM given:   Medicare IM given by:   Date Additional Medicare IM given:   Additional Medicare IM given by:    Discharge Disposition:    Per UR Regulation:  Reviewed for med. necessity/level of care/duration of stay  If discussed at [place] of Stay Meetings, dates discussed:    Comments:  June 08, 2014/Rhonda L. Rosana Hoes, RN, BSN, CCM. Case Management [place] [phone] No discharge needs present of time of review.

## 2014-06-08 NOTE — Progress Notes (Signed)
[facility] Progress Note Patient Name: WADE SIGALA DOB: 01/23/1971 MRN: [id number]   Date of Service  06/08/2014  HPI/Events of Note  oliguria  eICU Interventions  Bolus 500cc saline     Intervention Category Intermediate Interventions: Oliguria - evaluation and management  MCQUAID, DOUGLAS 06/08/2014, 10:12 PM

## 2014-06-08 NOTE — Progress Notes (Signed)
1 Day Post-Op  Subjective: She looks good, having some pain at the surgical site, but otherwise no complaints,  Morphine makes it easier for her to breath deeply. Pt reports she has been losing weight and having pain for over a month, she thought it was due to her Fitbit, diet and increased exercise. Objective: Vital signs in last 24 hours: Temp:  [97.6 F (36.4 C)-98.5 F (36.9 C)] 97.6 F (36.4 C) (03/17 0409) Pulse Rate:  [48-100] 50 (03/17 0647) Resp:  [8-32] 18 (03/17 0647) BP: (75-114)/(30-73) 85/56 mmHg (03/17 0647) SpO2:  [83 %-100 %] 100 % (03/17 0647) Weight:  [79.379 kg (175 lb)] 79.379 kg (175 lb) (03/16 1815) Last BM Date: 06/06/14 6590 IV intake 2750 urine yesterday, NG 300 NPO Hypotensive almost since admission, but not tachycardic, Sats: 93% or higher since last night BMP OK H/H is down significantly since ED presentation WBC is up She got lasix in PACU for post op pulmonary edema BMP OK, WBC up to 21.9 Currently on Flagyl and Zosyn Intake/Output from previous day: 03/16 0701 - 03/17 0700 In: 7550 [I.V.:6950; IV Piggyback:600] Out: 2025 [Urine:2750; Emesis/NG output:300; Blood:225] Intake/Output this shift:    General appearance: alert, cooperative, no distress and still having pain.  Clear mentation. Resp: clear to auscultation bilaterally and anterior exam Cardio: regular rate and rhythm, S1, S2 normal, no murmur, click, rub or gallop GI: soft, very sore, incision looks fine, no distension. No bowel sounds. Extremities: extremities normal, atraumatic, no cyanosis or edema and no edema  Lab Results:   Recent Labs  06/06/14 2342 06/07/14 0001 06/08/14 0355  WBC 5.4  --  21.9*  HGB 10.5* 12.9 7.4*  HCT 34.8* 38.0 24.1*  PLT 334  --  199    BMET  Recent Labs  06/06/14 2342 06/07/14 0001 06/08/14 0355  NA 139 140 141  K 3.9 3.9 3.9  CL 102 102 109  CO2 21  --  23  GLUCOSE 107* 106* 132*  BUN 20 21 19   CREATININE 0.98 0.90 0.86  CALCIUM  9.7  --  7.5*   PT/INR  Recent Labs  06/07/14 0316  LABPROT 13.9  INR 1.06     Recent Labs Lab 06/07/14 0046  AST 44*  ALT 31  ALKPHOS 207*  BILITOT 0.6  PROT 7.8  ALBUMIN 3.9     Lipase     Component Value Date/Time   LIPASE 20 06/07/2014 0046     Studies/Results: Portable Chest Xray - Atelectasis  06/07/2014   CLINICAL DATA:  Shortness of breath after surgery  EXAM: PORTABLE CHEST - 1 VIEW  COMPARISON:  CT chest 06/07/2014  FINDINGS: Bilateral interstitial and alveolar airspace opacities. No pleural effusion or pneumothorax. Stable cardiomediastinal silhouette. Nasogastric tube coursing below the diaphragm.  The osseous structures are unremarkable.  IMPRESSION: Bilateral interstitial and alveolar airspace opacities concerning for mild pulmonary edema.   Electronically Signed   By: Kathreen Devoid   On: 06/07/2014 16:26   Dg Chest Port 1 View  06/07/2014   CLINICAL DATA:  Acute onset of chest pain and shortness of breath. Back pain for 1 day. Initial encounter.  EXAM: PORTABLE CHEST - 1 VIEW  COMPARISON:  None.  FINDINGS: The lungs are well-aerated. Pulmonary vascularity is at the upper limits of normal. There is no evidence of focal opacification, pleural effusion or pneumothorax.  The cardiomediastinal silhouette is within normal limits. No acute osseous abnormalities are seen.  IMPRESSION: No acute cardiopulmonary process seen.   Electronically  Signed   By: Garald Balding M.D.   On: 06/07/2014 01:20   Ct Angio Chest Aorta W/cm &/or Wo/cm  06/07/2014   CLINICAL DATA:  Acute onset of chest pain for 2 hours, radiating to the back. Nausea, vomiting, shortness of breath and diaphoresis. Elevated D-dimer. Arm tingling and leg weakness. Initial encounter.  EXAM: CT ANGIOGRAPHY CHEST, ABDOMEN AND PELVIS  TECHNIQUE: Multidetector CT imaging through the chest, abdomen and pelvis was performed using the standard protocol during bolus administration of intravenous contrast. Multiplanar  reconstructed images and MIPs were obtained and reviewed to evaluate the vascular anatomy.  CONTRAST:  146mL OMNIPAQUE IOHEXOL 350 MG/ML SOLN  COMPARISON:  None.  FINDINGS: CTA CHEST FINDINGS  There is no evidence of aortic dissection. There is no evidence of aneurysmal dilatation. No calcific atherosclerotic disease is seen. The great vessels are unremarkable in appearance.  There is no evidence of pulmonary embolus.  Minimal bibasilar atelectasis is noted. Minimal nodularity at the lung apices likely also reflects atelectasis, though minimal infection might have a similar appearance. There is no evidence of pleural effusion or pneumothorax. No masses are identified; no abnormal focal contrast enhancement is seen.  The mediastinum is unremarkable in appearance. A tiny calcified node is seen adjacent to the distal esophagus. There is question of mild distal esophageal wall thickening, which could reflect minimal esophagitis, or could remain within normal limits. No pericardial effusion is identified. No axillary lymphadenopathy is seen. The visualized portions of the thyroid gland are unremarkable in appearance.  Mild nodularity to the left fibroglandular tissue is thought to remain within normal limits. No suspicious soft tissue masses are seen within the chest.  No acute osseous abnormalities are seen.  Review of the MIP images confirms the above findings.  CTA ABDOMEN AND PELVIS FINDINGS  There is no evidence of aortic dissection. There is no evidence of aneurysmal dilatation. No calcific atherosclerotic disease is seen. Incidental note is made of a circumaortic left renal vein. The celiac trunk, superior mesenteric artery, bilateral renal arteries and inferior mesenteric artery appear patent. The inferior vena cava is unremarkable in appearance.  There is a large complex mass at the left lower quadrant. It measures approximately 9.3 x 8.4 x 7.5 cm, with a large amount of fluid and air noted centrally, and a  diffusely irregular peripheral soft tissue border. Mild surrounding soft tissue inflammation is seen. This is suggested to be contiguous with the adjacent mid jejunum.  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 Jejunal adenocarcinoma can mimic this appearance, and metastatic disease as might be seen with melanoma can also have such an appearance, though there are no additional findings seen to suggest melanoma.  A centrally necrotic tumor is thought to be less likely, given the relatively typical appearance for small bowel lymphoma. A walled-off abscess is also very unlikely, given recruitment of vasculature surrounding the mass, adjacent mild nodularity overlying the descending colon, and a few prominent associated mesenteric nodes measuring up to 1.0 cm in size (best seen on coronal images).  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 However, the cecum and appendix are still noted on the right side.  There is a somewhat heterogeneous appearance to hepatic enhancement, which may reflect underlying fatty infiltration or mild venous congestion. No definite focal mass is seen. The spleen is unremarkable in appearance. The gallbladder is within normal limits. Hepatic hilar  nodes remain normal in size. The pancreas and adrenal glands are unremarkable.  A 1.0 cm cyst is noted at the upper pole of the right kidney. The kidneys are otherwise unremarkable. There is no evidence of hydronephrosis. No renal or ureteral stones are seen. No perinephric stranding is appreciated.  Trace fluid within the pelvis may be physiologic in nature, or could be related to the left lower quadrant abdominal mass. The stomach is within normal limits.  The appendix is normal in caliber, without evidence for appendicitis. The colon is partially filled with stool and is unremarkable in appearance, aside  from mild adjacent nodularity along the descending colon as described above, at the level of the mass.  The bladder is mildly distended and grossly unremarkable. The uterus has a somewhat heterogeneous appearance, but remains normal in size. This may reflect multiple tiny fibroids. The ovaries are relatively symmetric. No suspicious adnexal masses are seen. No inguinal lymphadenopathy is seen.  No acute osseous abnormalities are identified.  Review of the MIP images confirms the above findings.  IMPRESSION: 1. No evidence of aortic dissection. No evidence of aneurysmal dilatation. No calcific atherosclerotic disease seen. 2. Large complex 9.3 x 8.4 x 7.5 cm mass noted at the left lower quadrant. It contains a large amount of fluid and air, with a diffusely irregular peripheral soft tissue or. Mild surrounding soft tissue inflammation seen. This is suggested to be contiguous with the adjacent mid ileum. 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 Jejunal adenocarcinoma can mimic this appearance, and metastatic disease as might be seen with melanoma can also have such an appearance, though there are no additional findings to suggest melanoma. 3. There is recruitment of vasculature about the mass, mild nodularity overlying the adjacent descending colon, and a few prominent associated mesenteric nodes. These findings are highly suggestive of malignancy. 4. 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 However, the cecum and appendix are still seen on the right side. 5. Minimal nodularity at the lung apices likely reflects atelectasis, though minimal infection might have a similar appearance. Minimal bibasilar atelectasis noted. 6. Suggestion of mild distal esophageal wall thickening, which could reflect minimal esophagitis, or could remain within normal limits. 7. Circumaortic  left renal vein incidentally noted. 8. Somewhat heterogeneous appearance to hepatic enhancement. This may reflect underlying fatty infiltration or mild venous congestion. No focal mass seen. 9. Small right renal cyst noted. 10. Trace free fluid within the pelvis may be physiologic in nature, or could be related to the left lower quadrant abdominal mass. 11. Heterogeneous appearance to the uterus may reflect multiple tiny fibroids.  These results were called by telephone at the time of interpretation on 06/07/2014 at 2:58 am to Dr. Veatrice Kells, who verbally acknowledged these results.   Electronically Signed   By: Garald Balding M.D.   On: 06/07/2014 03:55   Ct Angio Abd/pel W/ And/or W/o  06/07/2014   CLINICAL DATA:  Acute onset of chest pain for 2 hours, radiating to the back. Nausea, vomiting, shortness of breath and diaphoresis. Elevated D-dimer. Arm tingling and leg weakness. Initial encounter.  EXAM: CT ANGIOGRAPHY CHEST, ABDOMEN AND PELVIS  TECHNIQUE: Multidetector CT imaging through the chest, abdomen and pelvis was performed using the standard protocol during bolus administration of intravenous contrast. Multiplanar reconstructed images and MIPs were obtained and reviewed to evaluate the vascular anatomy.  CONTRAST:  173mL OMNIPAQUE IOHEXOL 350 MG/ML SOLN  COMPARISON:  None.  FINDINGS: CTA CHEST FINDINGS  There is no evidence of aortic dissection. There is no evidence of aneurysmal dilatation. No calcific atherosclerotic disease is seen. The great vessels are unremarkable in appearance.  There is no evidence of pulmonary embolus.  Minimal bibasilar atelectasis is noted. Minimal nodularity at the lung apices likely also reflects atelectasis, though minimal infection might have a similar appearance. There is no evidence of pleural effusion or pneumothorax. No masses are identified; no abnormal focal contrast enhancement is seen.  The mediastinum is unremarkable in appearance. A tiny calcified node is seen  adjacent to the distal esophagus. There is question of mild distal esophageal wall thickening, which could reflect minimal esophagitis, or could remain within normal limits. No pericardial effusion is identified. No axillary lymphadenopathy is seen. The visualized portions of the thyroid gland are unremarkable in appearance.  Mild nodularity to the left fibroglandular tissue is thought to remain within normal limits. No suspicious soft tissue masses are seen within the chest.  No acute osseous abnormalities are seen.  Review of the MIP images confirms the above findings.  CTA ABDOMEN AND PELVIS FINDINGS  There is no evidence of aortic dissection. There is no evidence of aneurysmal dilatation. No calcific atherosclerotic disease is seen. Incidental note is made of a circumaortic left renal vein. The celiac trunk, superior mesenteric artery, bilateral renal arteries and inferior mesenteric artery appear patent. The inferior vena cava is unremarkable in appearance.  There is a large complex mass at the left lower quadrant. It measures approximately 9.3 x 8.4 x 7.5 cm, with a large amount of fluid and air noted centrally, and a diffusely irregular peripheral soft tissue border. Mild surrounding soft tissue inflammation is seen. This is suggested to be contiguous with the adjacent mid jejunum.  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 Jejunal adenocarcinoma can mimic this appearance, and metastatic disease as might be seen with melanoma can also have such an appearance, though there are no additional findings seen to suggest melanoma.  A centrally necrotic tumor is thought to be less likely, given the relatively typical appearance for small bowel lymphoma. A walled-off abscess is also very unlikely, given recruitment of vasculature surrounding the mass, adjacent mild nodularity overlying the descending colon, and a few prominent associated  mesenteric nodes measuring up to 1.0 cm in size (best seen on coronal images).  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 However, the cecum and appendix are still noted on the right side.  There is a somewhat heterogeneous appearance to hepatic enhancement, which may reflect underlying fatty infiltration or mild venous congestion. No definite focal mass is seen. The spleen is unremarkable in appearance. The gallbladder is within normal limits. Hepatic hilar nodes remain normal in size. The pancreas and adrenal glands are unremarkable.  A 1.0 cm cyst is noted at the upper pole of the right kidney. The kidneys are otherwise unremarkable. There is no evidence of hydronephrosis. No renal or ureteral stones are seen. No perinephric stranding is appreciated.  Trace fluid within the pelvis may be physiologic in nature, or could be related to the left lower quadrant abdominal mass. The stomach is within normal limits.  The appendix is normal in caliber, without evidence for appendicitis. The colon is partially filled with stool and is unremarkable in appearance, aside from mild adjacent  nodularity along the descending colon as described above, at the level of the mass.  The bladder is mildly distended and grossly unremarkable. The uterus has a somewhat heterogeneous appearance, but remains normal in size. This may reflect multiple tiny fibroids. The ovaries are relatively symmetric. No suspicious adnexal masses are seen. No inguinal lymphadenopathy is seen.  No acute osseous abnormalities are identified.  Review of the MIP images confirms the above findings.  IMPRESSION: 1. No evidence of aortic dissection. No evidence of aneurysmal dilatation. No calcific atherosclerotic disease seen. 2. Large complex 9.3 x 8.4 x 7.5 cm mass noted at the left lower quadrant. It contains a large amount of fluid and air, with a diffusely irregular peripheral soft tissue or.  Mild surrounding soft tissue inflammation seen. This is suggested to be contiguous with the adjacent mid ileum. 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 Jejunal adenocarcinoma can mimic this appearance, and metastatic disease as might be seen with melanoma can also have such an appearance, though there are no additional findings to suggest melanoma. 3. There is recruitment of vasculature about the mass, mild nodularity overlying the adjacent descending colon, and a few prominent associated mesenteric nodes. These findings are highly suggestive of malignancy. 4. 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 However, the cecum and appendix are still seen on the right side. 5. Minimal nodularity at the lung apices likely reflects atelectasis, though minimal infection might have a similar appearance. Minimal bibasilar atelectasis noted. 6. Suggestion of mild distal esophageal wall thickening, which could reflect minimal esophagitis, or could remain within normal limits. 7. Circumaortic left renal vein incidentally noted. 8. Somewhat heterogeneous appearance to hepatic enhancement. This may reflect underlying fatty infiltration or mild venous congestion. No focal mass seen. 9. Small right renal cyst noted. 10. Trace free fluid within the pelvis may be physiologic in nature, or could be related to the left lower quadrant abdominal mass. 11. Heterogeneous appearance to the uterus may reflect multiple tiny fibroids.  These results were called by telephone at the time of interpretation on 06/07/2014 at 2:58 am to Dr. Veatrice Kells, who verbally acknowledged these results.   Electronically Signed   By: Garald Balding M.D.   On: 06/07/2014 03:55    Medications: . antiseptic oral rinse  7 mL Mouth Rinse q12n4p  . chlorhexidine  15 mL Mouth Rinse BID  . gi cocktail  30 mL Oral  Once  . heparin  5,000 Units Subcutaneous 3 times per day  . lip balm  1 application Topical BID  . piperacillin-tazobactam (ZOSYN)  IV  3.375 g Intravenous 3 times per day   . lactated ringers     Prior to Admission medications   Medication Sig Start Date End Date Taking? Authorizing Provider  citalopram (CELEXA) 10 MG tablet Take 10 mg by mouth daily.   Yes Historical Provider, MD  ibuprofen (ADVIL,MOTRIN) 200 MG tablet Take 400 mg by mouth every 6 (six) hours as needed for moderate pain.   Yes Historical Provider, MD  Multiple Vitamin (MULTIVITAMIN) tablet Take 1 tablet by mouth daily.   Yes Historical Provider, MD     Assessment/Plan Intra-abdominal mass, with sepsis EXPLORATORY LAPAROTOMY RESECTION OF ABDOMINAL MASS AND PORTION OF SMALL BOWEL, 06/07/14, Dr. Excell Seltzer  Hypotension  No significant PMH DVT prophylaxis:  Heparin/SCD Day 2 Zosyn/Flagyl   Plan:  Continue IV fluids, antibiotics, and  keep her in ICU.  She looks better than her numbers would suggest.  I am rechecking labs at 11:30, that will be over 7 hours since last lab.  She already has a type and screen in.     Repeat CBC CBC Latest Ref Rng 06/08/2014 06/08/2014 06/07/2014  WBC 4.0 - 10.5 K/uL 24.6(H) 21.9(H) -  Hemoglobin 12.0 - 15.0 g/dL 7.4(L) 7.4(L) 12.9  Hematocrit 36.0 - 46.0 % 24.2(L) 24.1(L) 38.0  Platelets 150 - 400 K/uL 212 199 -   Her BP is still down in the mid 80's with IV rate of 125 ml/hr.  Urine output appears down, nursing is measuring now.  I will give a 500 ml bolus now; if I don't see a rise in urine and BP I will ask CCM to see, and assist with care.    LOS: 1 day    Sultan Pargas 06/08/2014

## 2014-06-08 NOTE — Consult Note (Signed)
PULMONARY / CRITICAL CARE MEDICINE   Name: Miranda Howe MRN: [id number] DOB: Nov 19, 1970    ADMISSION DATE:  06/06/2014 CONSULTATION DATE:3/17  REFERRING MD :  CCS  CHIEF COMPLAINT:  abd pain  INITIAL PRESENTATION: abd pain  STUDIES:    SIGNIFICANT EVENTS: 3/16 abd surgery   HISTORY OF PRESENT ILLNESS:  44 yo WF , healthy, who developed periods of nausea and weight loss and on 3/16 she had severe chest pain. CT abd revealed large mass in mid abd. Taken to OR for emergency laparotomy and found to have large watery mass which was excised. Blood loss was 200 cc and she was extubated. Given lasix for "edema" and on 3/17 PCCM called for hypotension, decreased urine output and bradycardia(52). She is awake. Alert, warm and in no distress. SBP is 86 post IV dilaudid. Her hgb is 7.5 and may decrease with fluid challenge.We will fluid challenge her. She does appear toxic at this time.   PAST MEDICAL HISTORY :   has no past medical history on file.  has past surgical history that includes Wisdom tooth extraction; Cervical polypectomy (2010); Bartholin gland cyst excision (01/17/2009); and laparotomy (N/A, 06/07/2014). Prior to Admission medications   Medication Sig Start Date End Date Taking? Authorizing Provider  citalopram (CELEXA) 10 MG tablet Take 10 mg by mouth daily.   Yes Historical Provider, MD  ibuprofen (ADVIL,MOTRIN) 200 MG tablet Take 400 mg by mouth every 6 (six) hours as needed for moderate pain.   Yes Historical Provider, MD  Multiple Vitamin (MULTIVITAMIN) tablet Take 1 tablet by mouth daily.   Yes Historical Provider, MD   Allergies  Allergen Reactions  . Sulfa Antibiotics Hives    FAMILY HISTORY:  has no family status information on file.  SOCIAL HISTORY:  reports that she has never smoked. She has never used smokeless tobacco. She reports that she drinks alcohol. She reports that she does not use illicit drugs.  REVIEW OF SYSTEMS:  10 point review of system  taken, please see HPI for positives and negatives.  SUBJECTIVE:   VITAL SIGNS: Temp:  [97.5 F (36.4 C)-98.5 F (36.9 C)] 97.5 F (36.4 C) (03/17 1130) Pulse Rate:  [48-96] 50 (03/17 1130) Resp:  [8-32] 19 (03/17 1130) BP: (79-114)/(43-73) 93/62 mmHg (03/17 1130) SpO2:  [83 %-100 %] 100 % (03/17 1130) Weight:  [175 lb (79.379 kg)] 175 lb (79.379 kg) (03/16 1815) HEMODYNAMICS:   VENTILATOR SETTINGS:   INTAKE / OUTPUT:  Intake/Output Summary (Last 24 hours) at 06/08/14 1434 Last data filed at 06/08/14 1223  Gross per 24 hour  Intake   2250 ml  Output   3100 ml  Net   -850 ml    PHYSICAL EXAMINATION: General:  *WNWDWF NAD Neuro:  Intact HEENT:  No JVD/LAN Cardiovascular:  HSR RRR  Lungs:  CTA Abdomen:  Tender, dressing intact Musculoskeletal: intact Skin:  warm  LABS:  CBC  Recent Labs Lab 06/06/14 2342 06/07/14 0001 06/08/14 0355 06/08/14 1120  WBC 5.4  --  21.9* 24.6*  HGB 10.5* 12.9 7.4* 7.4*  HCT 34.8* 38.0 24.1* 24.2*  PLT 334  --  199 212   Coag's  Recent Labs Lab 06/07/14 0316  INR 1.06   BMET  Recent Labs Lab 06/06/14 2342 06/07/14 0001 06/08/14 0355  NA 139 140 141  K 3.9 3.9 3.9  CL 102 102 109  CO2 21  --  23  BUN 20 21 19   CREATININE 0.98 0.90 0.86  GLUCOSE 107* 106* 132*  Electrolytes  Recent Labs Lab 06/06/14 2342 06/08/14 0355  CALCIUM 9.7 7.5*   Sepsis Markers  Recent Labs Lab 06/07/14 0336 06/07/14 0636 06/08/14 1120  LATICACIDVEN 3.09* 3.08* 1.5   ABG No results for input(s): PHART, PCO2ART, PO2ART in the last 168 hours. Liver Enzymes  Recent Labs Lab 06/07/14 0046  AST 44*  ALT 31  ALKPHOS 207*  BILITOT 0.6  ALBUMIN 3.9   Cardiac Enzymes No results for input(s): TROPONINI, PROBNP in the last 168 hours. Glucose No results for input(s): GLUCAP in the last 168 hours.  Imaging Portable Chest Xray - Atelectasis  06/07/2014   CLINICAL DATA:  Shortness of breath after surgery  EXAM: PORTABLE  CHEST - 1 VIEW  COMPARISON:  CT chest 06/07/2014  FINDINGS: Bilateral interstitial and alveolar airspace opacities. No pleural effusion or pneumothorax. Stable cardiomediastinal silhouette. Nasogastric tube coursing below the diaphragm.  The osseous structures are unremarkable.  IMPRESSION: Bilateral interstitial and alveolar airspace opacities concerning for mild pulmonary edema.   Electronically Signed   By: Kathreen Devoid   On: 06/07/2014 16:26   Dg Chest Port 1 View  06/07/2014   CLINICAL DATA:  Acute onset of chest pain and shortness of breath. Back pain for 1 day. Initial encounter.  EXAM: PORTABLE CHEST - 1 VIEW  COMPARISON:  None.  FINDINGS: The lungs are well-aerated. Pulmonary vascularity is at the upper limits of normal. There is no evidence of focal opacification, pleural effusion or pneumothorax.  The cardiomediastinal silhouette is within normal limits. No acute osseous abnormalities are seen.  IMPRESSION: No acute cardiopulmonary process seen.   Electronically Signed   By: Garald Balding M.D.   On: 06/07/2014 01:20   Ct Angio Chest Aorta W/cm &/or Wo/cm  06/07/2014   CLINICAL DATA:  Acute onset of chest pain for 2 hours, radiating to the back. Nausea, vomiting, shortness of breath and diaphoresis. Elevated D-dimer. Arm tingling and leg weakness. Initial encounter.  EXAM: CT ANGIOGRAPHY CHEST, ABDOMEN AND PELVIS  TECHNIQUE: Multidetector CT imaging through the chest, abdomen and pelvis was performed using the standard protocol during bolus administration of intravenous contrast. Multiplanar reconstructed images and MIPs were obtained and reviewed to evaluate the vascular anatomy.  CONTRAST:  117mL OMNIPAQUE IOHEXOL 350 MG/ML SOLN  COMPARISON:  None.  FINDINGS: CTA CHEST FINDINGS  There is no evidence of aortic dissection. There is no evidence of aneurysmal dilatation. No calcific atherosclerotic disease is seen. The great vessels are unremarkable in appearance.  There is no evidence of pulmonary  embolus.  Minimal bibasilar atelectasis is noted. Minimal nodularity at the lung apices likely also reflects atelectasis, though minimal infection might have a similar appearance. There is no evidence of pleural effusion or pneumothorax. No masses are identified; no abnormal focal contrast enhancement is seen.  The mediastinum is unremarkable in appearance. A tiny calcified node is seen adjacent to the distal esophagus. There is question of mild distal esophageal wall thickening, which could reflect minimal esophagitis, or could remain within normal limits. No pericardial effusion is identified. No axillary lymphadenopathy is seen. The visualized portions of the thyroid gland are unremarkable in appearance.  Mild nodularity to the left fibroglandular tissue is thought to remain within normal limits. No suspicious soft tissue masses are seen within the chest.  No acute osseous abnormalities are seen.  Review of the MIP images confirms the above findings.  CTA ABDOMEN AND PELVIS FINDINGS  There is no evidence of aortic dissection. There is no evidence of aneurysmal dilatation. No  calcific atherosclerotic disease is seen. Incidental note is made of a circumaortic left renal vein. The celiac trunk, superior mesenteric artery, bilateral renal arteries and inferior mesenteric artery appear patent. The inferior vena cava is unremarkable in appearance.  There is a large complex mass at the left lower quadrant. It measures approximately 9.3 x 8.4 x 7.5 cm, with a large amount of fluid and air noted centrally, and a diffusely irregular peripheral soft tissue border. Mild surrounding soft tissue inflammation is seen. This is suggested to be contiguous with the adjacent mid jejunum.  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 Jejunal adenocarcinoma can mimic this appearance, and metastatic disease as might be seen with melanoma can also have such an  appearance, though there are no additional findings seen to suggest melanoma.  A centrally necrotic tumor is thought to be less likely, given the relatively typical appearance for small bowel lymphoma. A walled-off abscess is also very unlikely, given recruitment of vasculature surrounding the mass, adjacent mild nodularity overlying the descending colon, and a few prominent associated mesenteric nodes measuring up to 1.0 cm in size (best seen on coronal images).  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 However, the cecum and appendix are still noted on the right side.  There is a somewhat heterogeneous appearance to hepatic enhancement, which may reflect underlying fatty infiltration or mild venous congestion. No definite focal mass is seen. The spleen is unremarkable in appearance. The gallbladder is within normal limits. Hepatic hilar nodes remain normal in size. The pancreas and adrenal glands are unremarkable.  A 1.0 cm cyst is noted at the upper pole of the right kidney. The kidneys are otherwise unremarkable. There is no evidence of hydronephrosis. No renal or ureteral stones are seen. No perinephric stranding is appreciated.  Trace fluid within the pelvis may be physiologic in nature, or could be related to the left lower quadrant abdominal mass. The stomach is within normal limits.  The appendix is normal in caliber, without evidence for appendicitis. The colon is partially filled with stool and is unremarkable in appearance, aside from mild adjacent nodularity along the descending colon as described above, at the level of the mass.  The bladder is mildly distended and grossly unremarkable. The uterus has a somewhat heterogeneous appearance, but remains normal in size. This may reflect multiple tiny fibroids. The ovaries are relatively symmetric. No suspicious adnexal masses are seen. No inguinal lymphadenopathy is seen.  No acute osseous  abnormalities are identified.  Review of the MIP images confirms the above findings.  IMPRESSION: 1. No evidence of aortic dissection. No evidence of aneurysmal dilatation. No calcific atherosclerotic disease seen. 2. Large complex 9.3 x 8.4 x 7.5 cm mass noted at the left lower quadrant. It contains a large amount of fluid and air, with a diffusely irregular peripheral soft tissue or. Mild surrounding soft tissue inflammation seen. This is suggested to be contiguous with the adjacent mid ileum. 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 Jejunal adenocarcinoma can mimic this appearance, and metastatic disease as might be seen with melanoma can also have such an appearance, though there are no additional findings to suggest melanoma. 3. There is recruitment of vasculature about the mass, mild nodularity overlying the adjacent descending colon, and a few prominent associated mesenteric nodes. These findings are highly  suggestive of malignancy. 4. 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 However, the cecum and appendix are still seen on the right side. 5. Minimal nodularity at the lung apices likely reflects atelectasis, though minimal infection might have a similar appearance. Minimal bibasilar atelectasis noted. 6. Suggestion of mild distal esophageal wall thickening, which could reflect minimal esophagitis, or could remain within normal limits. 7. Circumaortic left renal vein incidentally noted. 8. Somewhat heterogeneous appearance to hepatic enhancement. This may reflect underlying fatty infiltration or mild venous congestion. No focal mass seen. 9. Small right renal cyst noted. 10. Trace free fluid within the pelvis may be physiologic in nature, or could be related to the left lower quadrant abdominal mass. 11. Heterogeneous appearance to the uterus may reflect  multiple tiny fibroids.  These results were called by telephone at the time of interpretation on 06/07/2014 at 2:58 am to Dr. Veatrice Kells, who verbally acknowledged these results.   Electronically Signed   By: Garald Balding M.D.   On: 06/07/2014 03:55   Ct Angio Abd/pel W/ And/or W/o  06/07/2014   CLINICAL DATA:  Acute onset of chest pain for 2 hours, radiating to the back. Nausea, vomiting, shortness of breath and diaphoresis. Elevated D-dimer. Arm tingling and leg weakness. Initial encounter.  EXAM: CT ANGIOGRAPHY CHEST, ABDOMEN AND PELVIS  TECHNIQUE: Multidetector CT imaging through the chest, abdomen and pelvis was performed using the standard protocol during bolus administration of intravenous contrast. Multiplanar reconstructed images and MIPs were obtained and reviewed to evaluate the vascular anatomy.  CONTRAST:  147mL OMNIPAQUE IOHEXOL 350 MG/ML SOLN  COMPARISON:  None.  FINDINGS: CTA CHEST FINDINGS  There is no evidence of aortic dissection. There is no evidence of aneurysmal dilatation. No calcific atherosclerotic disease is seen. The great vessels are unremarkable in appearance.  There is no evidence of pulmonary embolus.  Minimal bibasilar atelectasis is noted. Minimal nodularity at the lung apices likely also reflects atelectasis, though minimal infection might have a similar appearance. There is no evidence of pleural effusion or pneumothorax. No masses are identified; no abnormal focal contrast enhancement is seen.  The mediastinum is unremarkable in appearance. A tiny calcified node is seen adjacent to the distal esophagus. There is question of mild distal esophageal wall thickening, which could reflect minimal esophagitis, or could remain within normal limits. No pericardial effusion is identified. No axillary lymphadenopathy is seen. The visualized portions of the thyroid gland are unremarkable in appearance.  Mild nodularity to the left fibroglandular tissue is thought to remain within  normal limits. No suspicious soft tissue masses are seen within the chest.  No acute osseous abnormalities are seen.  Review of the MIP images confirms the above findings.  CTA ABDOMEN AND PELVIS FINDINGS  There is no evidence of aortic dissection. There is no evidence of aneurysmal dilatation. No calcific atherosclerotic disease is seen. Incidental note is made of a circumaortic left renal vein. The celiac trunk, superior mesenteric artery, bilateral renal arteries and inferior mesenteric artery appear patent. The inferior vena cava is unremarkable in appearance.  There is a large complex mass at the left lower quadrant. It measures approximately 9.3 x 8.4 x 7.5 cm, with a large amount of fluid and air noted centrally, and a diffusely irregular peripheral soft tissue border. Mild surrounding soft tissue inflammation is seen. This is suggested to be contiguous with the adjacent mid jejunum.  Given its appearance and the location, this is suspicious  for aneurysmal bowel dilatation due to small bowel lymphoma. Jejunal adenocarcinoma can mimic this appearance, and metastatic disease as might be seen with melanoma can also have such an appearance, though there are no additional findings seen to suggest melanoma.  A centrally necrotic tumor is thought to be less likely, given the relatively typical appearance for small bowel lymphoma. A walled-off abscess is also very unlikely, given recruitment of vasculature surrounding the mass, adjacent mild nodularity overlying the descending colon, and a few prominent associated mesenteric nodes measuring up to 1.0 cm in size (best seen on coronal images).  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 However, the cecum and appendix are still noted on the right side.  There is a somewhat heterogeneous appearance to hepatic enhancement, which may reflect underlying fatty infiltration or mild venous congestion. No  definite focal mass is seen. The spleen is unremarkable in appearance. The gallbladder is within normal limits. Hepatic hilar nodes remain normal in size. The pancreas and adrenal glands are unremarkable.  A 1.0 cm cyst is noted at the upper pole of the right kidney. The kidneys are otherwise unremarkable. There is no evidence of hydronephrosis. No renal or ureteral stones are seen. No perinephric stranding is appreciated.  Trace fluid within the pelvis may be physiologic in nature, or could be related to the left lower quadrant abdominal mass. The stomach is within normal limits.  The appendix is normal in caliber, without evidence for appendicitis. The colon is partially filled with stool and is unremarkable in appearance, aside from mild adjacent nodularity along the descending colon as described above, at the level of the mass.  The bladder is mildly distended and grossly unremarkable. The uterus has a somewhat heterogeneous appearance, but remains normal in size. This may reflect multiple tiny fibroids. The ovaries are relatively symmetric. No suspicious adnexal masses are seen. No inguinal lymphadenopathy is seen.  No acute osseous abnormalities are identified.  Review of the MIP images confirms the above findings.  IMPRESSION: 1. No evidence of aortic dissection. No evidence of aneurysmal dilatation. No calcific atherosclerotic disease seen. 2. Large complex 9.3 x 8.4 x 7.5 cm mass noted at the left lower quadrant. It contains a large amount of fluid and air, with a diffusely irregular peripheral soft tissue or. Mild surrounding soft tissue inflammation seen. This is suggested to be contiguous with the adjacent mid ileum. 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 Jejunal adenocarcinoma can mimic this appearance, and metastatic disease as might be seen with melanoma can also have such an appearance, though there are no additional  findings to suggest melanoma. 3. There is recruitment of vasculature about the mass, mild nodularity overlying the adjacent descending colon, and a few prominent associated mesenteric nodes. These findings are highly suggestive of malignancy. 4. 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 However, the cecum and appendix are still seen on the right side. 5. Minimal nodularity at the lung apices likely reflects atelectasis, though minimal infection might have a similar appearance. Minimal bibasilar atelectasis noted. 6. Suggestion of mild distal esophageal wall thickening, which could reflect minimal esophagitis, or could remain within normal limits. 7. Circumaortic left renal vein incidentally noted. 8. Somewhat heterogeneous appearance to hepatic enhancement. This may reflect underlying fatty infiltration or mild venous congestion. No focal mass seen. 9. Small right  renal cyst noted. 10. Trace free fluid within the pelvis may be physiologic in nature, or could be related to the left lower quadrant abdominal mass. 11. Heterogeneous appearance to the uterus may reflect multiple tiny fibroids.  These results were called by telephone at the time of interpretation on 06/07/2014 at 2:58 am to Dr. Veatrice Kells, who verbally acknowledged these results.   Electronically Signed   By: Garald Balding M.D.   On: 06/07/2014 03:55     ASSESSMENT / PLAN:  PULMONARY OETT A: No acute issue P:     CARDIOVASCULAR CVL A:  Hypotension Bradycardia P:  Fluid challenge Check procal for completeness Pressors ma be needed  RENAL Lab Results  Component Value Date   CREATININE 0.86 06/08/2014   CREATININE 0.90 06/07/2014   CREATININE 0.98 06/06/2014    A:   No acute issue P:     GASTROINTESTINAL A:  NPO GI mass P:   Per surgery  HEMATOLOGIC A:   Blood loss anemia P:  Tx for hgb<7 Serial h/h  INFECTIOUS A:   Abd wound/mass P:    2/16 wound>> Abx:  3/16 zoysn>>  ENDOCRINE A:  No acute issue P:     NEUROLOGIC A:   Intact P:   RASS goal:1 Pain meds as needed   FAMILY  - Updates:   - Inter-disciplinary family meet or Palliative Care meeting due by:  day 7    TODAY'S SUMMARY:  44 yo WF , healthy, who developed periods of nausea and weight loss and on 3/16 she had severe chest pain. CT abd revealed large mass in mid abd. Taken to OR for emergency laparotomy and found to have large watery mass which was excised. Blood loss was 200 cc and she was extubated. Given lasix for "edema" and on 3/17 PCCM called for hypotension, decreased urine output and bradycardia(52). She is awake. Alert, warm and in no distress. SBP is 86 post IV dilaudid. Her hgb is 7.5 and may decrease with fluid challenge.We will fluid challenge her. She does appear toxic at this time.   Richardson Landry Tierrah Anastos ACNP Maryanna Shape PCCM Pager [phone] till 3 pm If no answer page [phone] 06/08/2014, 2:42 PM

## 2014-06-08 NOTE — Clinical Documentation Improvement (Signed)
Presents with abdominal mass in LLQ, Sepsis documented in Op Report.   Patient was hypotensive - 74/42  "Because she has had obstructive symptoms and has some evidence of early shock, I do not think this can be electively managed" documented in Surgical Consult Note.  Fluid resuscitation given and IV rate increased  Please clarify if Shock ruled in or out and if yes, please specify the type of shock:  Septic Shock Cardiogenic Shock Hypovolemic Shock Other Condition Cannot Clinically determine  Thank You, Zoila Shutter ,RN Clinical Documentation Specialist:  [facility]

## 2014-06-09 DIAGNOSIS — A419 Sepsis, unspecified organism: Principal | ICD-10-CM

## 2014-06-09 DIAGNOSIS — IMO0001 Reserved for inherently not codable concepts without codable children: Secondary | ICD-10-CM | POA: Insufficient documentation

## 2014-06-09 DIAGNOSIS — R1904 Left lower quadrant abdominal swelling, mass and lump: Secondary | ICD-10-CM

## 2014-06-09 LAB — COMPREHENSIVE METABOLIC PANEL
ALBUMIN: 2.4 g/dL — AB (ref 3.5–5.2)
ALT: 29 U/L (ref 0–35)
ANION GAP: 6 (ref 5–15)
AST: 32 U/L (ref 0–37)
Alkaline Phosphatase: 92 U/L (ref 39–117)
BILIRUBIN TOTAL: 0.9 mg/dL (ref 0.3–1.2)
BUN: 31 mg/dL — ABNORMAL HIGH (ref 6–23)
CO2: 25 mmol/L (ref 19–32)
CREATININE: 1.04 mg/dL (ref 0.50–1.10)
Calcium: 7.4 mg/dL — ABNORMAL LOW (ref 8.4–10.5)
Chloride: 111 mmol/L (ref 96–112)
GFR, EST AFRICAN AMERICAN: 75 mL/min — AB (ref >90)
GFR, EST NON AFRICAN AMERICAN: 64 mL/min — AB (ref >90)
Glucose, Bld: 109 mg/dL — ABNORMAL HIGH (ref 70–99)
POTASSIUM: 4.1 mmol/L (ref 3.5–5.1)
Sodium: 142 mmol/L (ref 135–145)
TOTAL PROTEIN: 4.9 g/dL — AB (ref 6.0–8.3)

## 2014-06-09 LAB — CBC
HCT: 24.9 % — ABNORMAL LOW (ref 36.0–46.0)
Hemoglobin: 7.5 g/dL — ABNORMAL LOW (ref 12.0–15.0)
MCH: 22.6 pg — AB (ref 26.0–34.0)
MCHC: 30.1 g/dL (ref 30.0–36.0)
MCV: 75 fL — ABNORMAL LOW (ref 78.0–100.0)
Platelets: 228 10*3/uL (ref 150–400)
RBC: 3.32 MIL/uL — AB (ref 3.87–5.11)
RDW: 17.3 % — AB (ref 11.5–15.5)
WBC: 26 10*3/uL — ABNORMAL HIGH (ref 4.0–10.5)

## 2014-06-09 LAB — PHOSPHORUS: Phosphorus: 3.4 mg/dL (ref 2.3–4.6)

## 2014-06-09 LAB — TROPONIN I: TROPONIN I: 0.03 ng/mL (ref <0.031)

## 2014-06-09 LAB — MAGNESIUM: MAGNESIUM: 1.9 mg/dL (ref 1.5–2.5)

## 2014-06-09 LAB — BRAIN NATRIURETIC PEPTIDE: B Natriuretic Peptide: 386.3 pg/mL — ABNORMAL HIGH (ref 0.0–100.0)

## 2014-06-09 LAB — PROCALCITONIN: Procalcitonin: 53.99 ng/mL

## 2014-06-09 MED ORDER — FENTANYL CITRATE 0.05 MG/ML IJ SOLN
25.0000 ug | INTRAMUSCULAR | Status: DC | PRN
  Administered 2014-06-09 – 2014-06-12 (×22): 25 ug via INTRAVENOUS
  Filled 2014-06-09 (×23): qty 2

## 2014-06-09 MED ORDER — SCOPOLAMINE 1 MG/3DAYS TD PT72
1.0000 | MEDICATED_PATCH | TRANSDERMAL | Status: DC
  Administered 2014-06-09 – 2014-06-12 (×2): 1.5 mg via TRANSDERMAL
  Filled 2014-06-09 (×3): qty 1

## 2014-06-09 MED ORDER — LACTATED RINGERS IV SOLN
INTRAVENOUS | Status: DC
  Administered 2014-06-09 – 2014-06-10 (×4): via INTRAVENOUS

## 2014-06-09 MED ORDER — PHENOL 1.4 % MT LIQD
1.0000 | OROMUCOSAL | Status: DC | PRN
  Filled 2014-06-09: qty 177

## 2014-06-09 MED ORDER — ACETAMINOPHEN 10 MG/ML IV SOLN
1000.0000 mg | Freq: Four times a day (QID) | INTRAVENOUS | Status: AC
  Administered 2014-06-09 – 2014-06-10 (×4): 1000 mg via INTRAVENOUS
  Filled 2014-06-09 (×7): qty 100

## 2014-06-09 NOTE — Progress Notes (Signed)
eLink Physician-Brief Progress Note Patient Name: RAMONDA GALYON DOB: [birth year] MRN: [id number]   Date of Service  06/09/2014  HPI/Events of Note  Called d/t asymptomatic bradycardia. HR = 44 and BP = 91/56 (MAP = 65). Happens while the patient is sleeping and persists when she wakes up.   eICU Interventions  Will order: 1. D/C Dilaudid. 2. Fentanyl 25 mcg IV Q 1 hour PRN. 3. Scopolamine patch 1.5 mg Q 72 hours.     Intervention Category Major Interventions: Arrhythmia - evaluation and management  Jasen Hartstein Eugene 06/09/2014, 1:29 AM

## 2014-06-09 NOTE — Progress Notes (Signed)
eLink Physician-Brief Progress Note Patient Name: Miranda Howe DOB: 03-11-1971 MRN: [id number]   Date of Service  06/09/2014  HPI/Events of Note  Called to renew IV fluid order. Patient is NPO.  eICU Interventions  Will reorder LR to run IV at 125 mL/hour.     Intervention Category Minor Interventions: Routine modifications to care plan (e.g. PRN medications for pain, fever)  Sharren Schnurr Eugene 06/09/2014, 4:57 AM

## 2014-06-09 NOTE — Progress Notes (Signed)
PULMONARY / CRITICAL CARE MEDICINE   Name: Miranda Howe MRN: [id number] DOB: 11/07/1970    ADMISSION DATE:  06/06/2014 CONSULTATION DATE:3/17  REFERRING MD :  CCS  CHIEF COMPLAINT:  abd pain  INITIAL PRESENTATION: abd pain  STUDIES:    SIGNIFICANT EVENTS: 3/16 abd surgery   HISTORY OF PRESENT ILLNESS:  44 yo WF , healthy, who developed periods of nausea and weight loss and on 3/16 she had severe chest pain. CT abd revealed large mass in mid abd. Taken to OR for emergency laparotomy and found to have large watery mass which was excised. Blood loss was 200 cc and she was extubated. Given lasix for "edema" and on 3/17 PCCM called for hypotension, decreased urine output and bradycardia(52). She is awake. Alert, warm and in no distress. SBP is 86 post IV dilaudid. Her hgb is 7.5 and may decrease with fluid challenge.We will fluid challenge her. She does appear toxic at this time.    SUBJECTIVE:  NAD at rest VITAL SIGNS: Temp:  [97.5 F (36.4 C)-97.6 F (36.4 C)] 97.5 F (36.4 C) (03/18 0800) Pulse Rate:  [39-59] 41 (03/18 0800) Resp:  [10-27] 14 (03/18 0800) BP: (81-114)/(51-78) 114/61 mmHg (03/18 0800) SpO2:  [90 %-100 %] 100 % (03/18 0800) Weight:  [191 lb 12.8 oz (87 kg)] 191 lb 12.8 oz (87 kg) (03/18 0400) HEMODYNAMICS:   VENTILATOR SETTINGS:   INTAKE / OUTPUT:  Intake/Output Summary (Last 24 hours) at 06/09/14 0931 Last data filed at 06/09/14 0700  Gross per 24 hour  Intake 4216.67 ml  Output    570 ml  Net 3646.67 ml    PHYSICAL EXAMINATION: General:  WNWDWF NAD Neuro:  Intact HEENT:  No JVD/LAN Cardiovascular:  HSR RRR  Lungs:  CTA Abdomen:  Tender, dressing intact Musculoskeletal: intact Skin:  warm  LABS:  CBC  Recent Labs Lab 06/08/14 0355 06/08/14 1120 06/09/14 0403  WBC 21.9* 24.6* 26.0*  HGB 7.4* 7.4* 7.5*  HCT 24.1* 24.2* 24.9*  PLT 199 212 228   Coag's  Recent Labs Lab 06/07/14 0316  INR 1.06   BMET  Recent Labs Lab  06/08/14 0355 06/08/14 1510 06/09/14 0403  NA 141 142 142  K 3.9 3.8 4.1  CL 109 111 111  CO2 23 23 25   BUN 19 24* 31*  CREATININE 0.86 0.96 1.04  GLUCOSE 132* 108* 109*   Electrolytes  Recent Labs Lab 06/08/14 0355 06/08/14 1510 06/09/14 0403  CALCIUM 7.5* 7.3* 7.4*  MG  --  1.6 1.9  PHOS  --  3.4 3.4   Sepsis Markers  Recent Labs Lab 06/07/14 0636 06/08/14 1120 06/08/14 1539 06/08/14 1612 06/09/14 0403  LATICACIDVEN 3.08* 1.5  --  1.8  --   PROCALCITON  --   --  62.00  --  53.99   ABG No results for input(s): PHART, PCO2ART, PO2ART in the last 168 hours. Liver Enzymes  Recent Labs Lab 06/07/14 0046 06/09/14 0403  AST 44* 32  ALT 31 29  ALKPHOS 207* 92  BILITOT 0.6 0.9  ALBUMIN 3.9 2.4*   Cardiac Enzymes  Recent Labs Lab 06/08/14 1510 06/09/14 0403  TROPONINI 0.04* 0.03   Glucose No results for input(s): GLUCAP in the last 168 hours.  Imaging No results found.   ASSESSMENT / PLAN:  PULMONARY A: No acute issue P:     CARDIOVASCULAR CVL A:  Hypotension(resolved 3/18) Bradycardia(asymptomatic ) P:  Fluid challenge Check procal for completeness   RENAL Lab Results  Component Value  Date   CREATININE 1.04 06/09/2014   CREATININE 0.96 06/08/2014   CREATININE 0.86 06/08/2014    A:   No acute issue P:     GASTROINTESTINAL A:  NPO GI mass P:   Per surgery  HEMATOLOGIC  Recent Labs  06/08/14 1120 06/09/14 0403  HGB 7.4* 7.5*   A:   Blood loss anemia P:  Tx for hgb<7 Serial h/h  INFECTIOUS A:   Abd wound/mass P:   3/16 wound>> Abx:  3/16 zoysn>>  ENDOCRINE A:  No acute issue P:     NEUROLOGIC A:   Intact P:   RASS goal:1 Pain meds as needed   FAMILY  - Updates:   - Inter-disciplinary family meet or Palliative Care meeting due by:  day 7    TODAY'S SUMMARY:  BP better with IVF. Hr continues to be bradycardic but asymptomatic.   Richardson Landry Minor ACNP Maryanna Shape PCCM Pager [phone] till 3  pm  ATTENDING NOTE: I have personally reviewed patient's available data, including medical history, events of note, physical examination and test results as part of my evaluation. I have discussed with resident/NP and other careteam providers such as pharmacist, RN and RRT & co-ordinated with consultants. In addition, I personally evaluated patient and elicited key history of abdominal mass s/p resection ? GIST tumor with purulent contents, hypotensive, poor UO, exam findings of clear lungs, soft abdomen & labs showing slight rise cr, high procal.   Severe sepsis - responded to fluids Empiric abx Bradycardia, sinus  Oliguris- Check FEna, ct IVFs Rest per NP/medical resident whose note is outlined above and that I agree with and edited in full.   Rigoberto Noel MD 06/09/2014, 9:31 AM

## 2014-06-09 NOTE — Progress Notes (Signed)
Pt sat up in the bed in the chair position. Will attempt to dangle later this afternoon or sooner depending on the pt VS and how well she tolerates this. Will continue to monitor. Patient husband at the bedside.

## 2014-06-09 NOTE — Progress Notes (Signed)
Coworker RN called MD on call for bradycardia rates maintaining in the mid 30s while sleeping and increasing to 40s while awake. BP improved from earlier in the shift and pt remains asymptomatic. Urine output remains low as well even after NS bolus. MD on call ordered medications and staff will continue to monitor pt's condition closely.

## 2014-06-09 NOTE — Progress Notes (Signed)
2 Days Post-Op  Subjective: She looks good, still sore.  She stays bradycardic.  She has a FitBit and it shows resting heart rate at home before admit 54-65, so she isn't to different than before her admit.  She is sore and had a couple ice packs in place, but no real complaints.  Her NG wasn't working and I have got it going now.  Normal green colored NG drainage present. Throat is sore. Objective: Vital signs in last 24 hours: Temp:  [97.5 F (36.4 C)-97.6 F (36.4 C)] 97.5 F (36.4 C) (03/18 0400) Pulse Rate:  [39-59] 54 (03/18 0400) Resp:  [10-27] 19 (03/18 0400) BP: (81-108)/(51-78) 106/65 mmHg (03/18 0400) SpO2:  [90 %-100 %] 100 % (03/18 0400) Weight:  [87 kg (191 lb 12.8 oz)] 87 kg (191 lb 12.8 oz) (03/18 0400) Last BM Date: 06/06/14 (pre-op) 2366 IV NPO/ nothing recorded from NG 570 urine output yesterday recorded Afebrile, asymptomatic bradycardia, BP better. CMP OK creatinine 1.04 Trop 0.04/0.03 WBC up to 26K H/H is stable but she has post op anemia, I can't tell if it is dilutional or from blood loss.  Intake/Output from previous day: 03/17 0701 - 03/18 0700 In: 4466.7 [I.V.:2366.7; IV Piggyback:2100] Out: 570 [Urine:570] Intake/Output this shift:    General appearance: alert, cooperative and no distress Resp: clear to auscultation bilaterally Cardiac: sinus brady GI: soft, sore, few BS, no flatus.  Wound looks good, waffle dressing in place. Extremities: extremities normal, atraumatic, no cyanosis or edema  Lab Results:   Recent Labs  06/08/14 1120 06/09/14 0403  WBC 24.6* 26.0*  HGB 7.4* 7.5*  HCT 24.2* 24.9*  PLT 212 228    BMET  Recent Labs  06/08/14 1510 06/09/14 0403  NA 142 142  K 3.8 4.1  CL 111 111  CO2 23 25  GLUCOSE 108* 109*  BUN 24* 31*  CREATININE 0.96 1.04  CALCIUM 7.3* 7.4*   PT/INR  Recent Labs  06/07/14 0316  LABPROT 13.9  INR 1.06     Recent Labs Lab 06/07/14 0046 06/09/14 0403  AST 44* 32  ALT 31 29   ALKPHOS 207* 92  BILITOT 0.6 0.9  PROT 7.8 4.9*  ALBUMIN 3.9 2.4*     Lipase     Component Value Date/Time   LIPASE 20 06/07/2014 0046     Studies/Results: Portable Chest Xray - Atelectasis  06/07/2014   CLINICAL DATA:  Shortness of breath after surgery  EXAM: PORTABLE CHEST - 1 VIEW  COMPARISON:  CT chest 06/07/2014  FINDINGS: Bilateral interstitial and alveolar airspace opacities. No pleural effusion or pneumothorax. Stable cardiomediastinal silhouette. Nasogastric tube coursing below the diaphragm.  The osseous structures are unremarkable.  IMPRESSION: Bilateral interstitial and alveolar airspace opacities concerning for mild pulmonary edema.   Electronically Signed   By: Kathreen Devoid   On: 06/07/2014 16:26    Medications: . antiseptic oral rinse  7 mL Mouth Rinse q12n4p  . chlorhexidine  15 mL Mouth Rinse BID  . heparin  5,000 Units Subcutaneous 3 times per day  . lip balm  1 application Topical BID  . pantoprazole (PROTONIX) IV  40 mg Intravenous Q24H  . piperacillin-tazobactam (ZOSYN)  IV  3.375 g Intravenous 3 times per day  . scopolamine  1 patch Transdermal Q72H   . lactated ringers 125 mL/hr at 06/09/14 0500    Assessment/Plan 1.  Intra-abdominal mass, with sepsis 1A.  EXPLORATORY LAPAROTOMY RESECTION OF ABDOMINAL MASS AND PORTION OF SMALL BOWEL, 06/07/14, Dr. Excell Seltzer  2.  Septic shock with hypotension/oliguria 3.  Asymptomatic bradycardia 4.  Post op anemia 5.  Day 3 Zosyn/Flagyl 6.  DVT prophylaxis: Heparin/SCD 7.  No significant PMH    Plan:  Keep her in ICU for now. Continue foley to monitor urine output.  I will see if we can get her up today.  I am going to leave her fluids to CCM.  Pathology is still pending.  Continue IV tylenol for pain control.    LOS: 2 days    Calyn Sivils 06/09/2014

## 2014-06-10 ENCOUNTER — Inpatient Hospital Stay (HOSPITAL_COMMUNITY): Payer: 59

## 2014-06-10 DIAGNOSIS — J9811 Atelectasis: Secondary | ICD-10-CM | POA: Diagnosis present

## 2014-06-10 DIAGNOSIS — F419 Anxiety disorder, unspecified: Secondary | ICD-10-CM

## 2014-06-10 LAB — BASIC METABOLIC PANEL
Anion gap: 9 (ref 5–15)
BUN: 28 mg/dL — AB (ref 6–23)
CALCIUM: 7.5 mg/dL — AB (ref 8.4–10.5)
CO2: 24 mmol/L (ref 19–32)
Chloride: 109 mmol/L (ref 96–112)
Creatinine, Ser: 0.89 mg/dL (ref 0.50–1.10)
GFR calc Af Amer: >90 mL/min (ref >90)
GFR calc non Af Amer: 78 mL/min — ABNORMAL LOW (ref >90)
GLUCOSE: 78 mg/dL (ref 70–99)
Potassium: 3.5 mmol/L (ref 3.5–5.1)
Sodium: 142 mmol/L (ref 135–145)

## 2014-06-10 LAB — PHOSPHORUS: Phosphorus: 2.5 mg/dL (ref 2.3–4.6)

## 2014-06-10 LAB — TISSUE CULTURE: Culture: NO GROWTH

## 2014-06-10 LAB — MAGNESIUM: Magnesium: 1.8 mg/dL (ref 1.5–2.5)

## 2014-06-10 LAB — CBC
HCT: 28.1 % — ABNORMAL LOW (ref 36.0–46.0)
Hemoglobin: 8.5 g/dL — ABNORMAL LOW (ref 12.0–15.0)
MCH: 22.6 pg — AB (ref 26.0–34.0)
MCHC: 30.2 g/dL (ref 30.0–36.0)
MCV: 74.7 fL — ABNORMAL LOW (ref 78.0–100.0)
PLATELETS: 286 10*3/uL (ref 150–400)
RBC: 3.76 MIL/uL — AB (ref 3.87–5.11)
RDW: 17.1 % — AB (ref 11.5–15.5)
WBC: 17.1 10*3/uL — ABNORMAL HIGH (ref 4.0–10.5)

## 2014-06-10 LAB — TSH: TSH: 6.209 u[IU]/mL — ABNORMAL HIGH (ref 0.350–4.500)

## 2014-06-10 LAB — PROCALCITONIN: Procalcitonin: 26.56 ng/mL

## 2014-06-10 NOTE — Progress Notes (Signed)
Patient ID: Miranda Howe, female   DOB: 1970/06/01, 44 y.o.   MRN: [id number]  [facility]  POD#: 3  Subjective: Patient alert and responsive in ICU.  No complaints.  Has not been OOB yet.  Objective: Vital signs in last 24 hours: Temp:  [97.2 F (36.2 C)-98.7 F (37.1 C)] 98.1 F (36.7 C) (03/19 0022) Pulse Rate:  [38-51] 51 (03/19 0600) Resp:  [0-21] 15 (03/19 0600) BP: (98-122)/(54-71) 109/61 mmHg (03/19 0600) SpO2:  [99 %-100 %] 99 % (03/19 0600) Last BM Date: 06/06/14  Intake/Output from previous day: 03/18 0701 - 03/19 0700 In: 3560.4 [I.V.:3160.4; NG/GT:50; IV Piggyback:350] Out: 1210 [Urine:710; Emesis/NG output:500] Intake/Output this shift: Total I/O In: 260 [I.V.:260] Out: 325 [Urine:325]  Physical Exam: HEENT - sclerae clear, mucous membranes moist Neck - soft Chest - clear bilaterally Cor - RRR Abdomen - soft, mild distension; midline wound clear and dry, dressing intact; no BS present; NG with bilious Ext - no edema, non-tender Neuro - alert & oriented, no focal deficits  Lab Results:   Recent Labs  06/09/14 0403 06/10/14 0739  WBC 26.0* 17.1*  HGB 7.5* 8.5*  HCT 24.9* 28.1*  PLT 228 286   BMET  Recent Labs  06/09/14 0403 06/10/14 0739  NA 142 142  K 4.1 3.5  CL 111 109  CO2 25 24  GLUCOSE 109* 78  BUN 31* 28*  CREATININE 1.04 0.89  CALCIUM 7.4* 7.5*   PT/INR No results for input(s): LABPROT, INR in the last 72 hours. Comprehensive Metabolic Panel:    Component Value Date/Time   NA 142 06/10/2014 0739   NA 142 06/09/2014 0403   K 3.5 06/10/2014 0739   K 4.1 06/09/2014 0403   CL 109 06/10/2014 0739   CL 111 06/09/2014 0403   CO2 24 06/10/2014 0739   CO2 25 06/09/2014 0403   BUN 28* 06/10/2014 0739   BUN 31* 06/09/2014 0403   CREATININE 0.89 06/10/2014 0739   CREATININE 1.04 06/09/2014 0403   GLUCOSE 78 06/10/2014 0739   GLUCOSE 109* 06/09/2014 0403   CALCIUM 7.5* 06/10/2014 0739    CALCIUM 7.4* 06/09/2014 0403   AST 32 06/09/2014 0403   AST 44* 06/07/2014 0046   ALT 29 06/09/2014 0403   ALT 31 06/07/2014 0046   ALKPHOS 92 06/09/2014 0403   ALKPHOS 207* 06/07/2014 0046   BILITOT 0.9 06/09/2014 0403   BILITOT 0.6 06/07/2014 0046   PROT 4.9* 06/09/2014 0403   PROT 7.8 06/07/2014 0046   ALBUMIN 2.4* 06/09/2014 0403   ALBUMIN 3.9 06/07/2014 0046    Studies/Results: Dg Chest Port 1 View  06/10/2014   CLINICAL DATA:  Respiratory failure  EXAM: PORTABLE CHEST - 1 VIEW  COMPARISON:  06/07/2014  FINDINGS: Nasogastric tube extends into the stomach. There is partial clearance of airspace opacities with residual basilar opacities and probable small pleural effusions. Moderate cardiomegaly, unchanged.  IMPRESSION: Partial clearance of the central and upper airspace opacities with residual basilar consolidation bilaterally.   Electronically Signed   By: Andreas Newport M.D.   On: 06/10/2014 07:04    Anti-infectives: Anti-infectives    Start     Dose/Rate Route Frequency Ordered Stop   06/07/14 2200  piperacillin-tazobactam (ZOSYN) IVPB 3.375 g     3.375 g 12.5 mL/hr over 240 Minutes Intravenous 3 times per day 06/07/14 1831     06/07/14 0600  cefoTEtan (CEFOTAN) 2 g in dextrose 5 % 50 mL IVPB  Comments:  Pharmacy may adjust dose strength for optimal dosing.   Send with patient on call to the OR.  Anesthesia to complete antibiotic administration <35min prior to incision per [facility].   2 g 100 mL/hr over 30 Minutes Intravenous On call to O.R. 06/07/14 0438 06/07/14 1250   06/07/14 0600  metroNIDAZOLE (FLAGYL) IVPB 500 mg  Status:  Discontinued     500 mg 100 mL/hr over 60 Minutes Intravenous Every 6 hours 06/07/14 0438 06/07/14 1831   06/07/14 0500  cefTRIAXone (ROCEPHIN) 2 g in dextrose 5 % 50 mL IVPB  Status:  Discontinued    Comments:  Pharmacy may adjust dosing strength / duration / interval for maximal efficacy   2 g 100 mL/hr over 30 Minutes Intravenous  Every 24 hours 06/07/14 0438 06/07/14 1831      Assessment & Plans: Status post resection jejunal tumor  Continue IV abx  NG, NPO, IVF - await resolution of ileus  OOB to chair if tolerated  WBC improved to 17K  Per CCM  Earnstine Regal, MD, [facility] Office: [place] 06/10/2014

## 2014-06-10 NOTE — Progress Notes (Signed)
PULMONARY / CRITICAL CARE MEDICINE   Name: AYSE MCCARTIN MRN: [id number] DOB: August 30, 1970    ADMISSION DATE:  06/06/2014 CONSULTATION DATE:3/17  REFERRING MD :  CCS  CHIEF COMPLAINT:  abd pain  INITIAL PRESENTATION: abd pain  STUDIES:    SIGNIFICANT EVENTS: 3/16 abd surgery   SUBJECTIVE:  Not on pressors, some brady  VITAL SIGNS: Temp:  [97.2 F (36.2 C)-98.7 F (37.1 C)] 98.1 F (36.7 C) (03/19 0022) Pulse Rate:  [37-51] 51 (03/19 0600) Resp:  [0-21] 15 (03/19 0600) BP: (98-122)/(54-71) 109/61 mmHg (03/19 0600) SpO2:  [99 %-100 %] 99 % (03/19 0600) HEMODYNAMICS:   VENTILATOR SETTINGS:   INTAKE / OUTPUT:  Intake/Output Summary (Last 24 hours) at 06/10/14 0922 Last data filed at 06/10/14 0900  Gross per 24 hour  Intake 3660.42 ml  Output   1310 ml  Net 2350.42 ml    PHYSICAL EXAMINATION: General:  No distress, calm Neuro:  Intact, nonfocal, A o x 4 HEENT: no stridor, jvd wnl Cardiovascular:  s1 s2 RR brady 50  Lungs:  CTA Abdomen:  Tender mild, dressing intact Musculoskeletal: intact Skin:  warm  LABS:  CBC  Recent Labs Lab 06/08/14 1120 06/09/14 0403 06/10/14 0739  WBC 24.6* 26.0* 17.1*  HGB 7.4* 7.5* 8.5*  HCT 24.2* 24.9* 28.1*  PLT 212 228 286   Coag's  Recent Labs Lab 06/07/14 0316  INR 1.06   BMET  Recent Labs Lab 06/08/14 1510 06/09/14 0403 06/10/14 0739  NA 142 142 142  K 3.8 4.1 3.5  CL 111 111 109  CO2 23 25 24   BUN 24* 31* 28*  CREATININE 0.96 1.04 0.89  GLUCOSE 108* 109* 78   Electrolytes  Recent Labs Lab 06/08/14 1510 06/09/14 0403 06/10/14 0739  CALCIUM 7.3* 7.4* 7.5*  MG 1.6 1.9 1.8  PHOS 3.4 3.4 2.5   Sepsis Markers  Recent Labs Lab 06/07/14 0636 06/08/14 1120 06/08/14 1539 06/08/14 1612 06/09/14 0403 06/10/14 0739  LATICACIDVEN 3.08* 1.5  --  1.8  --   --   PROCALCITON  --   --  62.00  --  53.99 26.56   ABG No results for input(s): PHART, PCO2ART, PO2ART in the last 168  hours. Liver Enzymes  Recent Labs Lab 06/07/14 0046 06/09/14 0403  AST 44* 32  ALT 31 29  ALKPHOS 207* 92  BILITOT 0.6 0.9  ALBUMIN 3.9 2.4*   Cardiac Enzymes  Recent Labs Lab 06/08/14 1510 06/09/14 0403  TROPONINI 0.04* 0.03   Glucose No results for input(s): GLUCAP in the last 168 hours.  Imaging No results found.   ASSESSMENT / PLAN:  CARDIOVASCULAR CVL A:  Hypotension(resolved 3/18) Bradycardia(asymptomatic ) pcxr c/w edema Septic shock from abscess? resolving  P:  kvo volume Tele Echo Ensure tsh Map goal 60 Cortisol LA ressuring  RENAL Lab Results  Component Value Date   CREATININE 0.89 06/10/2014   CREATININE 1.04 06/09/2014   CREATININE 0.96 06/08/2014    A:   LA less 2, edema pcxr P:   kvo bmet in am   GASTROINTESTINAL A:  NPO GI mass P:   Per surgery No drain noted, risk reoccurrence abscess?  HEMATOLOGIC  Recent Labs  06/09/14 0403 06/10/14 0739  HGB 7.5* 8.5*   A:   Blood loss anemia P:  Tx for hgb<7 Serial h/h in am  scd  INFECTIOUS A:   Abd wound/mass P:   3/16 wound>> Abx:  3/16 zoysn>>  If declines add vanc  ENDOCRINE A:  Loletha Grayer, r/o rel AI P:   TSH cortisol  NEUROLOGIC A:   Intact P:   RASS goal:0 Pain meds as needed   FAMILY  - Updates: pt  - Inter-disciplinary family meet or Palliative Care meeting due by:  day [place]. Titus Mould, MD, [place] Pgr: [facility]

## 2014-06-11 ENCOUNTER — Inpatient Hospital Stay (HOSPITAL_COMMUNITY): Payer: 59

## 2014-06-11 DIAGNOSIS — K6389 Other specified diseases of intestine: Secondary | ICD-10-CM

## 2014-06-11 DIAGNOSIS — I9589 Other hypotension: Secondary | ICD-10-CM

## 2014-06-11 DIAGNOSIS — I319 Disease of pericardium, unspecified: Secondary | ICD-10-CM

## 2014-06-11 LAB — CBC WITH DIFFERENTIAL/PLATELET
BASOS ABS: 0 10*3/uL (ref 0.0–0.1)
Basophils Relative: 0 % (ref 0–1)
EOS PCT: 1 % (ref 0–5)
Eosinophils Absolute: 0.2 10*3/uL (ref 0.0–0.7)
HEMATOCRIT: 31 % — AB (ref 36.0–46.0)
Hemoglobin: 9.4 g/dL — ABNORMAL LOW (ref 12.0–15.0)
LYMPHS ABS: 4 10*3/uL (ref 0.7–4.0)
Lymphocytes Relative: 22 % (ref 12–46)
MCH: 22.4 pg — ABNORMAL LOW (ref 26.0–34.0)
MCHC: 30.3 g/dL (ref 30.0–36.0)
MCV: 74 fL — ABNORMAL LOW (ref 78.0–100.0)
Monocytes Absolute: 0.7 10*3/uL (ref 0.1–1.0)
Monocytes Relative: 4 % (ref 3–12)
Neutro Abs: 13.4 10*3/uL — ABNORMAL HIGH (ref 1.7–7.7)
Neutrophils Relative %: 73 % (ref 43–77)
Platelets: 313 10*3/uL (ref 150–400)
RBC: 4.19 MIL/uL (ref 3.87–5.11)
RDW: 17 % — AB (ref 11.5–15.5)
WBC: 18.3 10*3/uL — ABNORMAL HIGH (ref 4.0–10.5)

## 2014-06-11 LAB — COMPREHENSIVE METABOLIC PANEL
ALT: 23 U/L (ref 0–35)
AST: 23 U/L (ref 0–37)
Albumin: 2.3 g/dL — ABNORMAL LOW (ref 3.5–5.2)
Alkaline Phosphatase: 149 U/L — ABNORMAL HIGH (ref 39–117)
Anion gap: 11 (ref 5–15)
BILIRUBIN TOTAL: 1.2 mg/dL (ref 0.3–1.2)
BUN: 19 mg/dL (ref 6–23)
CALCIUM: 7.9 mg/dL — AB (ref 8.4–10.5)
CO2: 23 mmol/L (ref 19–32)
CREATININE: 0.74 mg/dL (ref 0.50–1.10)
Chloride: 106 mmol/L (ref 96–112)
GFR calc non Af Amer: >90 mL/min (ref >90)
GLUCOSE: 68 mg/dL — AB (ref 70–99)
Potassium: 3.4 mmol/L — ABNORMAL LOW (ref 3.5–5.1)
SODIUM: 140 mmol/L (ref 135–145)
Total Protein: 5.3 g/dL — ABNORMAL LOW (ref 6.0–8.3)

## 2014-06-11 LAB — PHOSPHORUS: PHOSPHORUS: 2.6 mg/dL (ref 2.3–4.6)

## 2014-06-11 LAB — CORTISOL: Cortisol, Plasma: 16 ug/dL

## 2014-06-11 LAB — MAGNESIUM: Magnesium: 1.8 mg/dL (ref 1.5–2.5)

## 2014-06-11 MED ORDER — AMPICILLIN-SULBACTAM SODIUM 3 (2-1) G IJ SOLR
3.0000 g | Freq: Four times a day (QID) | INTRAMUSCULAR | Status: DC
  Administered 2014-06-11 – 2014-06-13 (×9): 3 g via INTRAVENOUS
  Filled 2014-06-11 (×11): qty 3

## 2014-06-11 MED ORDER — HYDROCORTISONE NA SUCCINATE PF 100 MG IJ SOLR
50.0000 mg | Freq: Four times a day (QID) | INTRAMUSCULAR | Status: DC
Start: 2014-06-11 — End: 2014-06-13
  Administered 2014-06-11 – 2014-06-13 (×9): 50 mg via INTRAVENOUS
  Filled 2014-06-11 (×10): qty 1
  Filled 2014-06-11: qty 2
  Filled 2014-06-11: qty 1

## 2014-06-11 MED ORDER — POTASSIUM CHLORIDE 10 MEQ/100ML IV SOLN
10.0000 meq | INTRAVENOUS | Status: AC
  Administered 2014-06-11 (×2): 10 meq via INTRAVENOUS
  Filled 2014-06-11: qty 100

## 2014-06-11 NOTE — Progress Notes (Signed)
  Echocardiogram 2D Echocardiogram has been performed.  Lysle Rubens 06/11/2014, 9:35 AM

## 2014-06-11 NOTE — Progress Notes (Signed)
Patient ID: Miranda Howe, female   DOB: 11/23/1970, 44 y.o.   MRN: [id number]  [facility]  POD#: 4  Subjective: Patient comfortable, sleeping.  NG with bilious.  Patient states passed gas twice this AM.  Objective: Vital signs in last 24 hours: Temp:  [97.8 F (36.6 C)-98.5 F (36.9 C)] 97.8 F (36.6 C) (03/20 0400) Pulse Rate:  [43-79] 47 (03/20 0600) Resp:  [11-24] 23 (03/20 0600) BP: (94-121)/(53-68) 117/56 mmHg (03/20 0600) SpO2:  [94 %-100 %] 98 % (03/20 0600) Last BM Date: 06/06/14  Intake/Output from previous day: 03/19 0701 - 03/20 0700 In: 640 [I.V.:470; NG/GT:20; IV Piggyback:150] Out: 3400 [Urine:2550; Emesis/NG output:850] Intake/Output this shift:    Physical Exam: HEENT - sclerae clear, mucous membranes moist Neck - soft Chest - clear bilaterally Cor - RRR Abdomen - soft, mild distension; quiet; wound dry and intact Ext - no edema, non-tender Neuro - alert & oriented, no focal deficits  Lab Results:   Recent Labs  06/10/14 0739 06/11/14 0336  WBC 17.1* 18.3*  HGB 8.5* 9.4*  HCT 28.1* 31.0*  PLT 286 313   BMET  Recent Labs  06/10/14 0739 06/11/14 0336  NA 142 140  K 3.5 3.4*  CL 109 106  CO2 24 23  GLUCOSE 78 68*  BUN 28* 19  CREATININE 0.89 0.74  CALCIUM 7.5* 7.9*   PT/INR No results for input(s): LABPROT, INR in the last 72 hours. Comprehensive Metabolic Panel:    Component Value Date/Time   NA 140 06/11/2014 0336   NA 142 06/10/2014 0739   K 3.4* 06/11/2014 0336   K 3.5 06/10/2014 0739   CL 106 06/11/2014 0336   CL 109 06/10/2014 0739   CO2 23 06/11/2014 0336   CO2 24 06/10/2014 0739   BUN 19 06/11/2014 0336   BUN 28* 06/10/2014 0739   CREATININE 0.74 06/11/2014 0336   CREATININE 0.89 06/10/2014 0739   GLUCOSE 68* 06/11/2014 0336   GLUCOSE 78 06/10/2014 0739   CALCIUM 7.9* 06/11/2014 0336   CALCIUM 7.5* 06/10/2014 0739   AST 23 06/11/2014 0336   AST 32 06/09/2014 0403   ALT  23 06/11/2014 0336   ALT 29 06/09/2014 0403   ALKPHOS 149* 06/11/2014 0336   ALKPHOS 92 06/09/2014 0403   BILITOT 1.2 06/11/2014 0336   BILITOT 0.9 06/09/2014 0403   PROT 5.3* 06/11/2014 0336   PROT 4.9* 06/09/2014 0403   ALBUMIN 2.3* 06/11/2014 0336   ALBUMIN 2.4* 06/09/2014 0403    Studies/Results: Dg Chest Port 1 View  06/11/2014   CLINICAL DATA:  Pulmonary edema  EXAM: PORTABLE CHEST - 1 VIEW  COMPARISON:  06/10/2014  FINDINGS: Nasogastric tube extends into the stomach. Basilar airspace consolidation persists, left greater than right, with slight improvement. There is a somewhat better aeration in the bases, particularly on the right. There is a small left pleural effusion which is unchanged and a very small right effusion.  IMPRESSION: Slight improvement with better aeration in the bases although there is persistent airspace consolidation and pleural effusions.   Electronically Signed   By: Andreas Newport M.D.   On: 06/11/2014 07:03   Dg Chest Port 1 View  06/10/2014   CLINICAL DATA:  Respiratory failure  EXAM: PORTABLE CHEST - 1 VIEW  COMPARISON:  06/07/2014  FINDINGS: Nasogastric tube extends into the stomach. There is partial clearance of airspace opacities with residual basilar opacities and probable small pleural effusions. Moderate cardiomegaly, unchanged.  IMPRESSION: Partial  clearance of the central and upper airspace opacities with residual basilar consolidation bilaterally.   Electronically Signed   By: Andreas Newport M.D.   On: 06/10/2014 07:04    Anti-infectives: Anti-infectives    Start     Dose/Rate Route Frequency Ordered Stop   06/07/14 2200  piperacillin-tazobactam (ZOSYN) IVPB 3.375 g     3.375 g 12.5 mL/hr over 240 Minutes Intravenous 3 times per day 06/07/14 1831     06/07/14 0600  cefoTEtan (CEFOTAN) 2 g in dextrose 5 % 50 mL IVPB    Comments:  Pharmacy may adjust dose strength for optimal dosing.   Send with patient on call to the OR.  Anesthesia to  complete antibiotic administration <65min prior to incision per [facility].   2 g 100 mL/hr over 30 Minutes Intravenous On call to O.R. 06/07/14 0438 06/07/14 1250   06/07/14 0600  metroNIDAZOLE (FLAGYL) IVPB 500 mg  Status:  Discontinued     500 mg 100 mL/hr over 60 Minutes Intravenous Every 6 hours 06/07/14 0438 06/07/14 1831   06/07/14 0500  cefTRIAXone (ROCEPHIN) 2 g in dextrose 5 % 50 mL IVPB  Status:  Discontinued    Comments:  Pharmacy may adjust dosing strength / duration / interval for maximal efficacy   2 g 100 mL/hr over 30 Minutes Intravenous Every 24 hours 06/07/14 0438 06/07/14 1831      Assessment & Plans: Status post jejunal resection  NPO, NG, IVF - will clamp NG tube for 24 hours if tolerated  Continue IV Zosyn  - WBC stable at 18K  OOB, ambulate - consider transfer to floor if OK with CCM  Pain control - my benefit from PCA - will check with CCM  Earnstine Regal, MD, [facility] Office: [place] 06/11/2014

## 2014-06-11 NOTE — Progress Notes (Signed)
PULMONARY / CRITICAL CARE MEDICINE   Name: Miranda Howe MRN: [id number] DOB: [birth year]    ADMISSION DATE:  06/06/2014 CONSULTATION DATE:3/17  REFERRING MD :  CCS  CHIEF COMPLAINT:  abd pain  INITIAL PRESENTATION: abd pain  STUDIES:    SIGNIFICANT EVENTS: 3/16 abd surgery   SUBJECTIVE:  Pain is controlled autodiuresis  VITAL SIGNS: Temp:  [97.8 F (36.6 C)-98.6 F (37 C)] 98.6 F (37 C) (03/20 0825) Pulse Rate:  [43-79] 44 (03/20 0700) Resp:  [11-23] 16 (03/20 0700) BP: (94-121)/(53-68) 117/56 mmHg (03/20 0600) SpO2:  [94 %-100 %] 95 % (03/20 0700) HEMODYNAMICS:   VENTILATOR SETTINGS:   INTAKE / OUTPUT:  Intake/Output Summary (Last 24 hours) at 06/11/14 0916 Last data filed at 06/11/14 0825  Gross per 24 hour  Intake    410 ml  Output   3300 ml  Net  -2890 ml    PHYSICAL EXAMINATION: General:  No distress, calm Neuro:  Intact, nonfocal, A o x 4 HEENT: no stridor, jvd wnl Cardiovascular:  s1 s2 RR brady 40 no changes Lungs:  CTA Abdomen:  No abdo tenderness to slight palpation, no r, wound clean, passed gas, BS low Musculoskeletal: intact Skin:  warm  LABS:  CBC  Recent Labs Lab 06/09/14 0403 06/10/14 0739 06/11/14 0336  WBC 26.0* 17.1* 18.3*  HGB 7.5* 8.5* 9.4*  HCT 24.9* 28.1* 31.0*  PLT 228 286 313   Coag's  Recent Labs Lab 06/07/14 0316  INR 1.06   BMET  Recent Labs Lab 06/09/14 0403 06/10/14 0739 06/11/14 0336  NA 142 142 140  K 4.1 3.5 3.4*  CL 111 109 106  CO2 25 24 23   BUN 31* 28* 19  CREATININE 1.04 0.89 0.74  GLUCOSE 109* 78 68*   Electrolytes  Recent Labs Lab 06/09/14 0403 06/10/14 0739 06/11/14 0336  CALCIUM 7.4* 7.5* 7.9*  MG 1.9 1.8 1.8  PHOS 3.4 2.5 2.6   Sepsis Markers  Recent Labs Lab 06/07/14 0636 06/08/14 1120 06/08/14 1539 06/08/14 1612 06/09/14 0403 06/10/14 0739  LATICACIDVEN 3.08* 1.5  --  1.8  --   --   PROCALCITON  --   --  62.00  --  53.99 26.56   ABG No results for  input(s): PHART, PCO2ART, PO2ART in the last 168 hours. Liver Enzymes  Recent Labs Lab 06/07/14 0046 06/09/14 0403 06/11/14 0336  AST 44* 32 23  ALT 31 29 23   ALKPHOS 207* 92 149*  BILITOT 0.6 0.9 1.2  ALBUMIN 3.9 2.4* 2.3*   Cardiac Enzymes  Recent Labs Lab 06/08/14 1510 06/09/14 0403  TROPONINI 0.04* 0.03   Glucose No results for input(s): GLUCAP in the last 168 hours.  Imaging Dg Chest Port 1 View  06/10/2014   CLINICAL DATA:  Respiratory failure  EXAM: PORTABLE CHEST - 1 VIEW  COMPARISON:  06/07/2014  FINDINGS: Nasogastric tube extends into the stomach. There is partial clearance of airspace opacities with residual basilar opacities and probable small pleural effusions. Moderate cardiomegaly, unchanged.  IMPRESSION: Partial clearance of the central and upper airspace opacities with residual basilar consolidation bilaterally.   Electronically Signed   By: Andreas Newport M.D.   On: 06/10/2014 07:04     ASSESSMENT / PLAN:  CARDIOVASCULAR CVL A:  Hypotension(resolved 3/18) Bradycardia(asymptomatic ) pcxr c/w edema Septic shock from abscess? resolving  P:  kvo volume, allow autodiuresis Tele Echo pending Ensure tsh, noted, assess t3 t4 Map goal 60 Cortisol 16, consider stress steroids   RENAL Lab Results  Component Value Date   CREATININE 0.74 06/11/2014   CREATININE 0.89 06/10/2014   CREATININE 1.04 06/09/2014    A:   hypoK P:   kvo bmet in am  k supp IV  GASTROINTESTINAL A:  NPO GI mass P:   Per surgery Clamp NGT per surgery  HEMATOLOGIC  Recent Labs  06/10/14 0739 06/11/14 0336  HGB 8.5* 9.4*   A:   Blood loss anemia hemoconcentration wbc noted, was neg 600 cc P:  Tx for hgb<7 Follow wbc trend further scd  INFECTIOUS A:   Abd wound/mass P:   3/16 wound>> Abx:  3/16 zoysn>>3/20 3/20 unasyn>>>  Narrow to unasyn, came from home Follow OR finals  ENDOCRINE A:  Brady, r/o rel AI TSH slight up (unlikley to be  clinically relevant) P:   TSH noted, assess t3 t4 Cortisol noted, add stress roids for now  NEUROLOGIC A:   Intact P:   RASS goal:0 Pain is controled with minimal intermittent narcs, no PCA at this stage PT consult  FAMILY  - Updates: pt  - Inter-disciplinary family meet or Palliative Care meeting due by:  day 7  Lavon Paganini. Titus Mould, MD, [place] Pgr: [facility]

## 2014-06-12 LAB — BASIC METABOLIC PANEL
Anion gap: 13 (ref 5–15)
BUN: 21 mg/dL (ref 6–23)
CALCIUM: 8 mg/dL — AB (ref 8.4–10.5)
CHLORIDE: 105 mmol/L (ref 96–112)
CO2: 21 mmol/L (ref 19–32)
Creatinine, Ser: 0.79 mg/dL (ref 0.50–1.10)
GFR calc non Af Amer: >90 mL/min (ref >90)
Glucose, Bld: 98 mg/dL (ref 70–99)
POTASSIUM: 3.9 mmol/L (ref 3.5–5.1)
SODIUM: 139 mmol/L (ref 135–145)

## 2014-06-12 LAB — ANAEROBIC CULTURE

## 2014-06-12 LAB — PHOSPHORUS: Phosphorus: 3.8 mg/dL (ref 2.3–4.6)

## 2014-06-12 LAB — T4, FREE: Free T4: 0.75 ng/dL — ABNORMAL LOW (ref 0.80–1.80)

## 2014-06-12 LAB — MAGNESIUM: Magnesium: 2 mg/dL (ref 1.5–2.5)

## 2014-06-12 LAB — CREATININE, URINE, RANDOM: CREATININE, URINE: 154.8 mg/dL

## 2014-06-12 LAB — SODIUM, URINE, RANDOM: Sodium, Ur: 40 mEq/L

## 2014-06-12 NOTE — Progress Notes (Signed)
PT Cancellation Note  Patient Details Name: Miranda Howe MRN: [id number] DOB: [birth year]   Cancelled Treatment:    Reason Eval/Treat Not Completed: Other (comment) (has walked in hall with nursing. may not require PT. check back in AM.)   Claretha Cooper 06/12/2014, 3:26 PM Tresa Endo PT [phone]

## 2014-06-12 NOTE — Progress Notes (Signed)
PULMONARY / CRITICAL CARE MEDICINE   Name: Miranda Howe MRN: [id number] DOB: 04-05-1970    ADMISSION DATE:  06/06/2014 CONSULTATION DATE:3/17  REFERRING MD :  CCS  CHIEF COMPLAINT:  abd pain  INITIAL PRESENTATION: abd pain  STUDIES:    SIGNIFICANT EVENTS: 3/16 abd surgery   SUBJECTIVE:  Pain is controlled autodiuresis  VITAL SIGNS: Temp:  [97.4 F (36.3 C)-97.8 F (36.6 C)] 97.5 F (36.4 C) (03/21 0517) Pulse Rate:  [52-86] 58 (03/21 0517) Resp:  [16-20] 17 (03/21 0517) BP: (114-116)/(62-64) 115/62 mmHg (03/21 0517) SpO2:  [97 %-98 %] 98 % (03/21 0517) HEMODYNAMICS:   VENTILATOR SETTINGS:   INTAKE / OUTPUT:  Intake/Output Summary (Last 24 hours) at 06/12/14 1157 Last data filed at 06/12/14 1124  Gross per 24 hour  Intake    300 ml  Output   1700 ml  Net  -1400 ml    PHYSICAL EXAMINATION: General:  No distress, calm Neuro:  Intact, nonfocal, A o x 4 HEENT: no stridor, jvd wnl Cardiovascular:  s1 s2 RR  Lungs:  CTA Abdomen:  No abdo tenderness to slight palpation, no r, wound clean, passed gas, BS low Musculoskeletal: intact Skin:  warm  LABS:  CBC  Recent Labs Lab 06/09/14 0403 06/10/14 0739 06/11/14 0336  WBC 26.0* 17.1* 18.3*  HGB 7.5* 8.5* 9.4*  HCT 24.9* 28.1* 31.0*  PLT 228 286 313   Coag's  Recent Labs Lab 06/07/14 0316  INR 1.06   BMET  Recent Labs Lab 06/10/14 0739 06/11/14 0336 06/12/14 0541  NA 142 140 139  K 3.5 3.4* 3.9  CL 109 106 105  CO2 24 23 21   BUN 28* 19 21  CREATININE 0.89 0.74 0.79  GLUCOSE 78 68* 98   Electrolytes  Recent Labs Lab 06/10/14 0739 06/11/14 0336 06/12/14 0541  CALCIUM 7.5* 7.9* 8.0*  MG 1.8 1.8 2.0  PHOS 2.5 2.6 3.8   Sepsis Markers  Recent Labs Lab 06/07/14 0636 06/08/14 1120 06/08/14 1539 06/08/14 1612 06/09/14 0403 06/10/14 0739  LATICACIDVEN 3.08* 1.5  --  1.8  --   --   PROCALCITON  --   --  62.00  --  53.99 26.56   ABG No results for input(s): PHART,  PCO2ART, PO2ART in the last 168 hours. Liver Enzymes  Recent Labs Lab 06/07/14 0046 06/09/14 0403 06/11/14 0336  AST 44* 32 23  ALT 31 29 23   ALKPHOS 207* 92 149*  BILITOT 0.6 0.9 1.2  ALBUMIN 3.9 2.4* 2.3*   Cardiac Enzymes  Recent Labs Lab 06/08/14 1510 06/09/14 0403  TROPONINI 0.04* 0.03   Glucose No results for input(s): GLUCAP in the last 168 hours.  Imaging Dg Chest Port 1 View  06/11/2014   CLINICAL DATA:  Pulmonary edema  EXAM: PORTABLE CHEST - 1 VIEW  COMPARISON:  06/10/2014  FINDINGS: Nasogastric tube extends into the stomach. Basilar airspace consolidation persists, left greater than right, with slight improvement. There is a somewhat better aeration in the bases, particularly on the right. There is a small left pleural effusion which is unchanged and a very small right effusion.  IMPRESSION: Slight improvement with better aeration in the bases although there is persistent airspace consolidation and pleural effusions.   Electronically Signed   By: Andreas Newport M.D.   On: 06/11/2014 07:03     ASSESSMENT / PLAN:  CARDIOVASCULAR CVL A:  Hypotension(resolved 3/18) Bradycardia(asymptomatic ) pcxr c/w edema Septic shock from abscess resolved  P:  kvo volume, allow autodiuresis Tele  Echo pending Ensure tsh, noted, assess t3 t4 Map goal 60 Cortisol 16, consider stress steroids   RENAL Lab Results  Component Value Date   CREATININE 0.79 06/12/2014   CREATININE 0.74 06/11/2014   CREATININE 0.89 06/10/2014    A:   hypoK P:   kvo bmet in am  k supp IV  GASTROINTESTINAL A:  NPO GI mass P:   Per surgery Clamp NGT per surgery  HEMATOLOGIC  Recent Labs  06/10/14 0739 06/11/14 0336  HGB 8.5* 9.4*   A:   Blood loss anemia hemoconcentration wbc noted, was neg 600 cc P:  Tx for hgb<7 Follow wbc trend further scd  INFECTIOUS A:   Abd wound/mass P:   3/16 wound>>neg Abx:  3/16 zoysn>>3/20 3/20 unasyn>>>  Narrow to unasyn, came  from home Follow OR finals  ENDOCRINE A:  Loletha Grayer, r/o rel AI TSH slight up (unlikley to be clinically relevant) P:   TSH noted, assess t3 t4 Cortisol noted, add stress roids for now  NEUROLOGIC A:   Intact P:   RASS goal:0 Pain is controled with minimal intermittent narcs, no PCA at this stage PT consult  FAMILY  - Updates: pt  - Inter-disciplinary family meet or Palliative Care meeting due by:  day 7   PCCM will sign off 3/21     Richardson Landry Minor ACNP Maryanna Shape PCCM Pager [phone] till 3 pm If no answer page [phone]  I reviewed the CXR myself, infiltrate noted on left, less pulmonary edema.  BP much improved.  TSH noted, no change for now, continue abx as above, unasyn only.  Remains very weak on exam, need rehab.  PCCM will sign off, please call back if needed.  Patient seen and examined, agree with above note.  I dictated the care and orders written for this patient under my direction.  Rush Farmer, MD [phone]  06/12/2014, 11:57 AM

## 2014-06-12 NOTE — Progress Notes (Signed)
PT Cancellation Note  Patient Details Name: Miranda Howe MRN: [id number] DOB: Feb 19, 1971   Cancelled Treatment:    Reason Eval/Treat Not Completed: Medical issues which prohibited therapy (patient hopefull NG will be removed.and requests PT check back later. )   Claretha Cooper 06/12/2014, 10:10 AM Tresa Endo PT [phone]

## 2014-06-12 NOTE — Progress Notes (Signed)
Patient ID: Miranda Howe, female   DOB: 1970-07-20, 44 y.o.   MRN: [id number] 5 Days Post-Op  Subjective: NG tube his major complaint. No significant abdominal pain. Has had flatus.  Objective: Vital signs in last 24 hours: Temp:  [97.4 F (36.3 C)-97.8 F (36.6 C)] 97.5 F (36.4 C) (03/21 0517) Pulse Rate:  [52-86] 58 (03/21 0517) Resp:  [16-20] 17 (03/21 0517) BP: (114-116)/(62-66) 115/62 mmHg (03/21 0517) SpO2:  [97 %-98 %] 98 % (03/21 0517) Last BM Date: 06/06/14  Intake/Output from previous day: 03/20 0701 - 03/21 0700 In: 530 [I.V.:10; NG/GT:20; IV Piggyback:500] Out: 1850 [Urine:1850] Intake/Output this shift:    General appearance: alert, cooperative and no distress Resp: clear to auscultation bilaterally GI: normal findings: soft, non-tender and nondistended Incision/Wound: no erythema or drainage  Lab Results:   Recent Labs  06/10/14 0739 06/11/14 0336  WBC 17.1* 18.3*  HGB 8.5* 9.4*  HCT 28.1* 31.0*  PLT 286 313   BMET  Recent Labs  06/11/14 0336 06/12/14 0541  NA 140 139  K 3.4* 3.9  CL 106 105  CO2 23 21  GLUCOSE 68* 98  BUN 19 21  CREATININE 0.74 0.79  CALCIUM 7.9* 8.0*     Studies/Results: Dg Chest Port 1 View  06/11/2014   CLINICAL DATA:  Pulmonary edema  EXAM: PORTABLE CHEST - 1 VIEW  COMPARISON:  06/10/2014  FINDINGS: Nasogastric tube extends into the stomach. Basilar airspace consolidation persists, left greater than right, with slight improvement. There is a somewhat better aeration in the bases, particularly on the right. There is a small left pleural effusion which is unchanged and a very small right effusion.  IMPRESSION: Slight improvement with better aeration in the bases although there is persistent airspace consolidation and pleural effusions.   Electronically Signed   By: Andreas Newport M.D.   On: 06/11/2014 07:03    Anti-infectives: Anti-infectives    Start     Dose/Rate Route Frequency Ordered Stop   06/11/14 1000   Ampicillin-Sulbactam (UNASYN) 3 g in sodium chloride 0.9 % 100 mL IVPB     3 g 100 mL/hr over 60 Minutes Intravenous Every 6 hours 06/11/14 0923     06/07/14 2200  piperacillin-tazobactam (ZOSYN) IVPB 3.375 g  Status:  Discontinued     3.375 g 12.5 mL/hr over 240 Minutes Intravenous 3 times per day 06/07/14 1831 06/11/14 0923   06/07/14 0600  cefoTEtan (CEFOTAN) 2 g in dextrose 5 % 50 mL IVPB    Comments:  Pharmacy may adjust dose strength for optimal dosing.   Send with patient on call to the OR.  Anesthesia to complete antibiotic administration <14min prior to incision per [facility] [address] [address].   2 g 100 mL/hr over 30 Minutes Intravenous On call to O.R. 06/07/14 0438 06/07/14 1250   06/07/14 0600  metroNIDAZOLE (FLAGYL) IVPB 500 mg  Status:  Discontinued     500 mg 100 mL/hr over 60 Minutes Intravenous Every 6 hours 06/07/14 0438 06/07/14 1831   06/07/14 0500  cefTRIAXone (ROCEPHIN) 2 g in dextrose 5 % 50 mL IVPB  Status:  Discontinued    Comments:  Pharmacy may adjust dosing strength / duration / interval for maximal efficacy   2 g 100 mL/hr over 30 Minutes Intravenous Every 24 hours 06/07/14 0438 06/07/14 1831      Assessment/Plan: s/p Procedure(s): EXPLORATORY LAPAROTOMY RESECTION OF ABDOMINAL MASS AND PORTION OF SMALL BOWEL Presented with sepsis from perforation of tumor into jejunum with abscess. Gram stain showed  multiple organisms but cultures unrevealing Overall improving. Discontinue NG tube and Foley. Continue nothing by mouth for now. Continue IV antibiotics Final pathology pending    LOS: 5 days    [name] T 06/12/2014

## 2014-06-13 DIAGNOSIS — C49A3 Gastrointestinal stromal tumor of small intestine: Secondary | ICD-10-CM | POA: Diagnosis present

## 2014-06-13 DIAGNOSIS — C179 Malignant neoplasm of small intestine, unspecified: Secondary | ICD-10-CM

## 2014-06-13 DIAGNOSIS — E039 Hypothyroidism, unspecified: Secondary | ICD-10-CM | POA: Diagnosis present

## 2014-06-13 DIAGNOSIS — R001 Bradycardia, unspecified: Secondary | ICD-10-CM | POA: Diagnosis present

## 2014-06-13 LAB — CBC
HCT: 27.9 % — ABNORMAL LOW (ref 36.0–46.0)
HEMOGLOBIN: 8.5 g/dL — AB (ref 12.0–15.0)
MCH: 22.1 pg — ABNORMAL LOW (ref 26.0–34.0)
MCHC: 30.5 g/dL (ref 30.0–36.0)
MCV: 72.7 fL — ABNORMAL LOW (ref 78.0–100.0)
Platelets: 317 10*3/uL (ref 150–400)
RBC: 3.84 MIL/uL — ABNORMAL LOW (ref 3.87–5.11)
RDW: 17.2 % — AB (ref 11.5–15.5)
WBC: 13.8 10*3/uL — ABNORMAL HIGH (ref 4.0–10.5)

## 2014-06-13 MED ORDER — LACTATED RINGERS IV BOLUS (SEPSIS): 1000.0000 mL | Freq: Three times a day (TID) | INTRAVENOUS | Status: DC | PRN

## 2014-06-13 MED ORDER — FENTANYL CITRATE 0.05 MG/ML IJ SOLN: 25.0000 ug | INTRAMUSCULAR | Status: DC | PRN

## 2014-06-13 MED ORDER — NYSTATIN 100000 UNIT/GM EX CREA
TOPICAL_CREAM | Freq: Two times a day (BID) | CUTANEOUS | Status: DC
  Administered 2014-06-13 – 2014-06-14 (×3): via TOPICAL
  Administered 2014-06-14 – 2014-06-15 (×2): 1 via TOPICAL
  Filled 2014-06-13 (×2): qty 15

## 2014-06-13 MED ORDER — PANTOPRAZOLE SODIUM 40 MG PO TBEC
40.0000 mg | DELAYED_RELEASE_TABLET | Freq: Every day | ORAL | Status: DC
  Administered 2014-06-13 – 2014-06-15 (×3): 40 mg via ORAL
  Filled 2014-06-13 (×3): qty 1

## 2014-06-13 MED ORDER — ALUM & MAG HYDROXIDE-SIMETH 200-200-20 MG/5ML PO SUSP: 30.0000 mL | Freq: Four times a day (QID) | ORAL | Status: DC | PRN

## 2014-06-13 MED ORDER — HYDROCORTISONE 20 MG PO TABS
20.0000 mg | ORAL_TABLET | Freq: Every day | ORAL | Status: DC
  Filled 2014-06-13: qty 1

## 2014-06-13 MED ORDER — METHOCARBAMOL 1000 MG/10ML IJ SOLN
1000.0000 mg | Freq: Four times a day (QID) | INTRAMUSCULAR | Status: DC | PRN
  Filled 2014-06-13: qty 10

## 2014-06-13 MED ORDER — HYDROCORTISONE NA SUCCINATE PF 100 MG IJ SOLR: 25.0000 mg | Freq: Once | INTRAMUSCULAR | Status: DC

## 2014-06-13 MED ORDER — HYDROCORTISONE NA SUCCINATE PF 100 MG IJ SOLR: 50.0000 mg | Freq: Every day | INTRAMUSCULAR | Status: DC

## 2014-06-13 MED ORDER — FLUCONAZOLE 150 MG PO TABS
150.0000 mg | ORAL_TABLET | Freq: Once | ORAL | Status: AC
  Administered 2014-06-13: 150 mg via ORAL
  Filled 2014-06-13: qty 1

## 2014-06-13 MED ORDER — PROMETHAZINE HCL 25 MG/ML IJ SOLN: 6.2500 mg | INTRAMUSCULAR | Status: DC | PRN

## 2014-06-13 MED ORDER — HYDROCORTISONE NA SUCCINATE PF 100 MG IJ SOLR
50.0000 mg | Freq: Two times a day (BID) | INTRAMUSCULAR | Status: DC
  Filled 2014-06-13: qty 1

## 2014-06-13 MED ORDER — OXYCODONE HCL 5 MG PO TABS
5.0000 mg | ORAL_TABLET | ORAL | Status: DC | PRN
  Administered 2014-06-15: 5 mg via ORAL
  Filled 2014-06-13: qty 1

## 2014-06-13 MED ORDER — HYDROCORTISONE 20 MG PO TABS
20.0000 mg | ORAL_TABLET | Freq: Every day | ORAL | Status: DC
  Administered 2014-06-14 – 2014-06-15 (×2): 20 mg via ORAL
  Filled 2014-06-13 (×2): qty 1

## 2014-06-13 MED ORDER — ACETAMINOPHEN 500 MG PO TABS
1000.0000 mg | ORAL_TABLET | Freq: Three times a day (TID) | ORAL | Status: DC
  Administered 2014-06-13 – 2014-06-14 (×4): 1000 mg via ORAL
  Filled 2014-06-13 (×7): qty 2

## 2014-06-13 MED ORDER — HYDROCORTISONE 10 MG PO TABS: 10.0000 mg | ORAL_TABLET | Freq: Every day | ORAL | Status: DC

## 2014-06-13 NOTE — Consult Note (Addendum)
[facility] Medical Consultation  ZANDRIA WOLDT MNO:[id number] DOB: 1971-01-01 DOA: 06/06/2014 PCP: Pcp Not In System   Requesting physician: Surgery Date of consultation: 06/13/14 Reason for consultation: as below  Impression/Recommendations Principal Problem:   GIST (gastrointestinal stromal tumor) of jejunum with perforation, s/p SB resection 06/07/2014 Active Problems:   Anxiety   Hypotension   Atelectasis   Hypothyroidism   Bradycardia with 31 - 40 beats per minute   Impression    44 year old lady probably has sick euthyroid syndrome. She has had a pretty stormy past couple of days and this would alter the HPA axis. Stress dose cortisol was given which I've cut way down to 20 mg for 3 days ending on 3/25 and then she can take 10 mg for 3 days ending on 3/28 I think that her bradycardias constitutional and related to volume. I would recommend transfusion at the threshold of 7.In addition I will also discontinue her scopolamine patch I have offered that the patient follow-up with Dr. Roque Cash of [facility] if she so desires to have formal endocrinology opinion I would also recommend to the patient to repeat a TSH when she is at her study homeostatic state--this would ideally be in about 3-4 weeks Initially I had ordered a bunch of antibody tests such as TPO, antithyroglobulin etc. Etc. but these can be followed up as an outpatient by either endocrinologist or her primary care physicianif needed  I spent over 20 minutes drawing diagrams for her and explaining to her HPA axis, and the thyroid hormones in detail She has a clear understanding that she doesn't need to necessarily use levothyroxin at this stage  Hospitalist service will sign off of this patient's care. Patient can follow-up with her primary care physician if needed when stable from a general surgery standpoint--- she has multiple questions about her diagnosis and the plan moving forward in terms of what  to do about her Gist tumor.   thank you for this consult. Please call me directly at pager [phone] if you have any questions or concerns   Chief Complaint: Deranged TSH, bradycardia  HPI:  44 y/o ? Gravida 4, family history of father having prostate cancer and otherwise bland medical history admitted 06/07/14 = 7 in tenths upper abdominal pain, and/V, tachycardia-CT abdomen pelvis = 9 cm left intraperitoneal mass.   Because of persistent hypotension IV fluid boluses given Dr. Excell Seltzer performed laparotomy as well as staple resection proximal jejunum-found a possible diverticulum/perforation into the mass with abscess -frozen section at the time showed? Low-grade spindle cell tumor VS chronic fibrotic reaction-EBL 200 cc  Postoperatively hemoglobin 7.5 Because of persisting hypotension, oliguria--severe sepsis noted and her to critical care medicine consulted  patient was given to further liters of IV fluid-BNP, troponin, and lactic acid were checked Solu-Cortef was started and is still being given for hypotension Patient also noted to have bradycardia into the 30s which has not happened prior to this although the husband stated to surgeon that this has happened before and patient was placed on scopolamine transdermally on the 18th    Patient is actually pretty healthy lady and has tried to lose weight intentionally over the past 6 months. Review of the system and discussion with the husband reveals that her blood pressure is always borderline low and she typically has a pulse rate that is in the 50-60 range at most for office visits.  She was told about 2 years ago at her primary care physician's office that her TSH  was little bit abnormal. She was also informed that this may need to be rechecked at some point  Hospitalist consult requested secondary to elevated TSH 6.2, depressed T4 0.75 T3 is still pending  Review of Systems:  - Chills - fever - shortness of breath - chest pain - blurred  vision - double vision - falls - unilateral weakness - temperature instability - loss of hair outside of her usual pattern Still has periods which are regular-not have nor light On exam today she has normal reflexes She does not feel any difficulty swallowing and does not have any dysphagia overall   History reviewed. No pertinent past medical history. Past Surgical History  Procedure Laterality Date  . Wisdom tooth extraction    . Cervical polypectomy  2010  . Bartholin gland cyst excision  01/17/2009    I&D  . Laparotomy N/A 06/07/2014    Procedure: EXPLORATORY LAPAROTOMY RESECTION OF ABDOMINAL MASS AND PORTION OF SMALL BOWEL;  Surgeon: Excell Seltzer, MD;  Location: [facility];  Service: General;  Laterality: N/A;   Social History:  reports that she has never smoked. She has never used smokeless tobacco. She reports that she drinks alcohol. She reports that she does not use illicit drugs.  Allergies  Allergen Reactions  . Sulfa Antibiotics Hives   Family History  Problem Relation Age of Onset  . Heart disease Father   . Hypertension Father   . Other Mother     varicose veins  . Cancer Mother   . Hypertension Mother   . Diabetes Mother   . Breast cancer Mother 76  . Lupus Cousin     Prior to Admission medications   Medication Sig Start Date End Date Taking? Authorizing Provider  citalopram (CELEXA) 10 MG tablet Take 10 mg by mouth daily.   Yes Historical Provider, MD  ibuprofen (ADVIL,MOTRIN) 200 MG tablet Take 400 mg by mouth every 6 (six) hours as needed for moderate pain.   Yes Historical Provider, MD  Multiple Vitamin (MULTIVITAMIN) tablet Take 1 tablet by mouth daily.   Yes Historical Provider, MD   Physical Exam: Blood pressure 128/70, pulse 42, temperature 97.9 F (36.6 C), temperature source Oral, resp. rate 15, height 5\' 3"  (1.6 m), weight 87 kg (191 lb 12.8 oz), last menstrual period 05/23/2014, SpO2 96 %. Filed Vitals:   06/13/14 0508  BP: 128/70  Pulse: 42   Temp: 97.9 F (36.6 C)  Resp: 15    EOMI NCAT , Morbid obesity, Body mass index is 33.98 kg/(m^2). I cannot appreciate any exophthalmos No pallor no icterus, extraocular movements intact, light reflex positive bilaterally Mallampati 2 No JVD no bruit No visible goiter. On direct palpation from a posterior approach I do not appreciate any enlargement of her thyroid or tethering. Reflexes brisk bilaterally in knees and in biceps and brachioradialis Sensation to cool touch is bilaterally equal Sensation to vibration is equal bilaterally   Labs on Admission:  Basic Metabolic Panel:  Recent Labs Lab 06/08/14 1510 06/09/14 0403 06/10/14 0739 06/11/14 0336 06/12/14 0541  NA 142 142 142 140 139  K 3.8 4.1 3.5 3.4* 3.9  CL 111 111 109 106 105  CO2 23 25 24 23 21   GLUCOSE 108* 109* 78 68* 98  BUN 24* 31* 28* 19 21  CREATININE 0.96 1.04 0.89 0.74 0.79  CALCIUM 7.3* 7.4* 7.5* 7.9* 8.0*  MG 1.6 1.9 1.8 1.8 2.0  PHOS 3.4 3.4 2.5 2.6 3.8   Liver Function Tests:  Recent Labs  Lab 06/07/14 0046 06/09/14 0403 06/11/14 0336  AST 44* 32 23  ALT 31 29 23   ALKPHOS 207* 92 149*  BILITOT 0.6 0.9 1.2  PROT 7.8 4.9* 5.3*  ALBUMIN 3.9 2.4* 2.3*    Recent Labs Lab 06/07/14 0046  LIPASE 20   No results for input(s): AMMONIA in the last 168 hours. CBC:  Recent Labs Lab 06/08/14 1120 06/09/14 0403 06/10/14 0739 06/11/14 0336 06/13/14 0517  WBC 24.6* 26.0* 17.1* 18.3* 13.8*  NEUTROABS  --   --   --  13.4*  --   HGB 7.4* 7.5* 8.5* 9.4* 8.5*  HCT 24.2* 24.9* 28.1* 31.0* 27.9*  MCV 74.7* 75.0* 74.7* 74.0* 72.7*  PLT 212 228 286 313 317   Cardiac Enzymes:  Recent Labs Lab 06/08/14 1510 06/09/14 0403  TROPONINI 0.04* 0.03   BNP: Invalid input(s): POCBNP CBG: No results for input(s): GLUCAP in the last 168 hours.  Radiological Exams on Admission: No results found.  EKG: Independently reviewed. Reviewed  Time spent: 85 minutes   Verlon Au [place]  Hospitalists Pager [phone] If 7PM-7AM, please contact night-coverage [email or web address] Password [facility] 06/13/2014, 10:22 AM

## 2014-06-13 NOTE — Progress Notes (Signed)
6 Days Post-Op  Subjective: Has redness outer labia and groin, walking some.  Remains Bradycardic 37-42, up to 98 walking.   Objective: Vital signs in last 24 hours: Temp:  [97.9 F (36.6 C)-98.3 F (36.8 C)] 97.9 F (36.6 C) (03/22 0508) Pulse Rate:  [42-60] 42 (03/22 0508) Resp:  [15-19] 15 (03/22 0508) BP: (113-128)/(61-70) 128/70 mmHg (03/22 0508) SpO2:  [92 %-99 %] 96 % (03/22 0508) Last BM Date: 06/12/14 Diet:  Ice chips 2 stools yesterday Nothing PO recorded  700 urine recorded. Afebrile, VSS WBC improving, H/H stable.  Intake/Output from previous day: 03/21 0701 - 03/22 0700 In: -  Out: 700 [Urine:700] Intake/Output this shift: Total I/O In: -  Out: 400 [Urine:400]  General appearance: alert, cooperative and no distress Resp: clear to auscultation bilaterally GI: soft, sore but looks good, + BS, wound looks good. Skin: hard red rash both mid thighs looks like candida.    Lab Results:   Recent Labs  06/11/14 0336 06/13/14 0517  WBC 18.3* 13.8*  HGB 9.4* 8.5*  HCT 31.0* 27.9*  PLT 313 317    BMET  Recent Labs  06/11/14 0336 06/12/14 0541  NA 140 139  K 3.4* 3.9  CL 106 105  CO2 23 21  GLUCOSE 68* 98  BUN 19 21  CREATININE 0.74 0.79  CALCIUM 7.9* 8.0*   PT/INR No results for input(s): LABPROT, INR in the last 72 hours.   Recent Labs Lab 06/07/14 0046 06/09/14 0403 06/11/14 0336  AST 44* 32 23  ALT 31 29 23   ALKPHOS 207* 92 149*  BILITOT 0.6 0.9 1.2  PROT 7.8 4.9* 5.3*  ALBUMIN 3.9 2.4* 2.3*     Lipase     Component Value Date/Time   LIPASE 20 06/07/2014 0046     Studies/Results: No results found.  Medications: . ampicillin-sulbactam (UNASYN) IV  3 g Intravenous Q6H  . antiseptic oral rinse  7 mL Mouth Rinse q12n4p  . chlorhexidine  15 mL Mouth Rinse BID  . heparin  5,000 Units Subcutaneous 3 times per day  . hydrocortisone sodium succinate  50 mg Intravenous Q6H  . lip balm  1 application Topical BID  . pantoprazole  (PROTONIX) IV  40 mg Intravenous Q24H  . scopolamine  1 patch Transdermal Q72H      Diagnosis Small intestine, resection for tumor, Abdominal mass - GASTROINTESTINAL STROMAL TUMOR, 10.2 CM WITH CENTRAL NECROTIC CAVITY. pT4, pNX  Assessment/Plan . Intra-abdominal mass, with sepsis 1A. EXPLORATORY LAPAROTOMY RESECTION OF ABDOMINAL MASS AND PORTION OF SMALL BOWEL, 06/07/14, Dr. Excell Seltzer. (path still pending) 2. Septic shock with hypotension/oliguria 3. Asymptomatic bradycardia 4. Post op anemia 5. Day 3 Zosyn/Flagyl  Switched to Ampicillin on 06/11/14. Changed by CCM 6. DVT prophylaxis: Heparin/SCD 7. No significant PMH 8.   Possible Hypothyroid TSH 6.209/T4 0.75 9.  Possible candidiasis legs. 10.  On Solu cortef since 06/11/14  Per CCM   Plan:  Stop the antibiotics, diflucan 150 PO x1, nystatin cream, clear liquids, ask Medicine to see about thyroid.  I will talked with CCM and will wean steroids also.      LOS: 6 days    Camden Knotek 06/13/2014

## 2014-06-13 NOTE — Progress Notes (Signed)
PT Cancellation Note  Patient Details Name: Miranda Howe MRN: [id number] DOB: [birth year]   Cancelled Treatment:    Reason Eval/Treat Not Completed: Other (comment) (pt just finished walking with husband and wants to rest, will attempt later. )   Philomena Doheny 06/13/2014, 11:37 AM [phone]

## 2014-06-14 LAB — THYROID ANTIBODIES
THYROID PEROXIDASE ANTIBODY: 13 [IU]/mL (ref 0–34)
Thyroglobulin Antibody: 24.6 IU/mL — ABNORMAL HIGH (ref 0.0–0.9)

## 2014-06-14 MED ORDER — CITALOPRAM HYDROBROMIDE 10 MG PO TABS
10.0000 mg | ORAL_TABLET | Freq: Every day | ORAL | Status: DC
  Administered 2014-06-14 – 2014-06-15 (×2): 10 mg via ORAL
  Filled 2014-06-14 (×2): qty 1

## 2014-06-14 MED ORDER — IBUPROFEN 200 MG PO TABS
400.0000 mg | ORAL_TABLET | Freq: Four times a day (QID) | ORAL | Status: DC | PRN
  Administered 2014-06-14: 400 mg via ORAL
  Filled 2014-06-14: qty 2

## 2014-06-14 MED ORDER — CITALOPRAM HYDROBROMIDE 10 MG PO TABS: 10.0000 mg | ORAL_TABLET | Freq: Every day | ORAL | Status: DC

## 2014-06-14 MED ORDER — FLUCONAZOLE 150 MG PO TABS
150.0000 mg | ORAL_TABLET | Freq: Once | ORAL | Status: AC
  Administered 2014-06-14: 150 mg via ORAL
  Filled 2014-06-14: qty 1

## 2014-06-14 MED ORDER — ADULT MULTIVITAMIN W/MINERALS CH
1.0000 | ORAL_TABLET | Freq: Every day | ORAL | Status: DC
  Administered 2014-06-14 – 2014-06-15 (×2): 1 via ORAL
  Filled 2014-06-14 (×2): qty 1

## 2014-06-14 MED ORDER — ACETAMINOPHEN 325 MG PO TABS: 650.0000 mg | ORAL_TABLET | Freq: Four times a day (QID) | ORAL | Status: DC | PRN

## 2014-06-14 MED ORDER — LORAZEPAM 0.5 MG PO TABS
0.2500 mg | ORAL_TABLET | Freq: Four times a day (QID) | ORAL | Status: DC | PRN
  Administered 2014-06-14: 0.5 mg via ORAL
  Filled 2014-06-14: qty 1

## 2014-06-14 NOTE — Progress Notes (Signed)
PT Cancellation Note and discharge  Patient Details Name: Miranda Howe MRN: [id number] DOB: 05-Oct-1970   Cancelled Treatment:    Reason Eval/Treat Not Completed: PT screened, no needs identified, will sign off. Observed pt ambulating with family in hallway multiple reps this AM.  Spoke to patient and do not feel pt needs skilled PT intervention and will d/c.  If anything changes and pt needs PT eval, please re-order.  Santiago Glad L. Tamala Julian, [place] Pager [phone] 06/14/2014    Miranda Howe 06/14/2014, 1:24 PM

## 2014-06-14 NOTE — Progress Notes (Signed)
7 Days Post-Op  Subjective: She is doing better, still mostly worried about her candidiasis than anything else.  Walking allot.  Diet just increased but she has not had the diet.  +BM.    Objective: Vital signs in last 24 hours: Temp:  [97.7 F (36.5 C)-98.1 F (36.7 C)] 97.7 F (36.5 C) (03/23 0523) Pulse Rate:  [45-92] 45 (03/23 0523) Resp:  [16-18] 16 (03/23 0523) BP: (106-118)/(61-71) 118/71 mmHg (03/23 0523) SpO2:  [96 %-98 %] 96 % (03/23 0523) Last BM Date: 06/13/14 240 recorded Urine 1200 Afebrile, VSS No labs today Intake/Output from previous day: 03/22 0701 - 03/23 0700 In: 240 [P.O.:240] Out: 1200 [Urine:1200] Intake/Output this shift: Total I/O In: -  Out: 500 [Urine:500]  General appearance: alert, cooperative and no distress Resp: clear to auscultation bilaterally Cardio: sinus rhythm, and her rate is more in the 60 range now. GI: soft sore, wound looks good, + BS. Skin:  candidiasis upper thighs still significant, but better. Vulva swelling still present.   Lab Results:   Recent Labs  06/13/14 0517  WBC 13.8*  HGB 8.5*  HCT 27.9*  PLT 317    BMET  Recent Labs  06/12/14 0541  NA 139  K 3.9  CL 105  CO2 21  GLUCOSE 98  BUN 21  CREATININE 0.79  CALCIUM 8.0*   PT/INR No results for input(s): LABPROT, INR in the last 72 hours.   Recent Labs Lab 06/09/14 0403 06/11/14 0336  AST 32 23  ALT 29 23  ALKPHOS 92 149*  BILITOT 0.9 1.2  PROT 4.9* 5.3*  ALBUMIN 2.4* 2.3*     Lipase     Component Value Date/Time   LIPASE 20 06/07/2014 0046     Studies/Results: No results found.  Medications: . acetaminophen  1,000 mg Oral TID  . antiseptic oral rinse  7 mL Mouth Rinse q12n4p  . chlorhexidine  15 mL Mouth Rinse BID  . heparin  5,000 Units Subcutaneous 3 times per day  . [START ON 06/17/2014] hydrocortisone  10 mg Oral Daily  . hydrocortisone  20 mg Oral Daily  . lip balm  1 application Topical BID  . nystatin cream   Topical BID   . pantoprazole  40 mg Oral Daily   Prior to Admission medications   Medication Sig Start Date End Date Taking? Authorizing Provider  citalopram (CELEXA) 10 MG tablet Take 10 mg by mouth daily.   Yes Historical Provider, MD  ibuprofen (ADVIL,MOTRIN) 200 MG tablet Take 400 mg by mouth every 6 (six) hours as needed for moderate pain.   Yes Historical Provider, MD  Multiple Vitamin (MULTIVITAMIN) tablet Take 1 tablet by mouth daily.   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 pT4, pNX  Assessment/Plan 1. Intra-abdominal mass, with sepsis 1A. EXPLORATORY LAPAROTOMY RESECTION OF ABDOMINAL MASS AND PORTION OF SMALL BOWEL, 06/07/14, Dr. Excell Seltzer. (path still pending) 2. Septic shock with hypotension/oliguria 3. Asymptomatic bradycardia 4. Post op anemia 5. Day 3 Zosyn/Flagyl Switched to Ampicillin on 06/11/14. Changed by CCM 6. DVT prophylaxis: Heparin/SCD 7. No significant PMH 8. Possible Hypothyroid TSH 6.209/T4 0.75 9. Possible candidiasis legs. 10. On Solu cortef since 06/11/14, being weaned.   Plan:  She is making good progress.  She will get her first soft real food at lunch.  I pulled her dressing off and it looks good but made her very nervous.  She is a week today, I will check on when to pull staples.  I will give her another dose of Diflucan and recheck labs in AM.  i am changing her pain meds to oral and PRN, restarting Celexa, changing ativan to PO.     LOS: 7 days    Arnet Hofferber 06/14/2014

## 2014-06-14 NOTE — Progress Notes (Signed)
Cleaned incision area with soap and water and patted dry. Covered with tegaderm. Pt took shower. Removed tegaderm after shower. Removed staples and placed steri-strips. Patient tolerated well.

## 2014-06-15 ENCOUNTER — Telehealth: Payer: Self-pay | Admitting: *Deleted

## 2014-06-15 MED ORDER — OXYCODONE HCL 5 MG PO TABS: 5.0000 mg | ORAL_TABLET | ORAL | Status: DC | PRN

## 2014-06-15 MED ORDER — HYDROCORTISONE 10 MG PO TABS: 10.0000 mg | ORAL_TABLET | Freq: Every day | ORAL | Status: DC

## 2014-06-15 MED ORDER — ACETAMINOPHEN 325 MG PO TABS: 650.0000 mg | ORAL_TABLET | Freq: Four times a day (QID) | ORAL | Status: DC | PRN

## 2014-06-15 NOTE — Discharge Instructions (Signed)
[facility]      [facility] °[phone] ° °OPEN ABDOMINAL SURGERY: POST OP INSTRUCTIONS ° °Always review your discharge instruction sheet given to you by the facility where your surgery was performed. ° °IF YOU HAVE DISABILITY OR FAMILY LEAVE FORMS, YOU MUST BRING THEM TO THE OFFICE FOR PROCESSING.  PLEASE DO NOT GIVE THEM TO YOUR DOCTOR. ° °1. A prescription for pain medication may be given to you upon discharge.  Take your pain medication as prescribed, if needed.  If narcotic pain medicine is not needed, then you may take acetaminophen (Tylenol) or ibuprofen (Advil) as needed. °2. Take your usually prescribed medications unless otherwise directed. °3. If you need a refill on your pain medication, please contact your pharmacy. They will contact our office to request authorization.  Prescriptions will not be filled after 5pm or on week-ends. °4. You should follow a light diet the first few days after arrival home, such as soup and crackers, pudding, etc.unless your doctor has advised otherwise. A high-fiber, low fat diet can be resumed as tolerated.   Be sure to include lots of fluids daily. Most patients will experience some swelling and bruising on the chest and neck area.  Ice packs will help.  Swelling and bruising can take several days to resolve °5. Most patients will experience some swelling and bruising in the area of the incision. Ice pack will help. Swelling and bruising can take several days to resolve..  °6. It is common to experience some constipation if taking pain medication after surgery.  Increasing fluid intake and taking a stool softener will usually help or prevent this problem from occurring.  A mild laxative (Milk of Magnesia or Miralax) should be taken according to package directions if there are no bowel movements after 48 hours. °7.  You may have steri-strips (small skin tapes) in place directly over the incision.  These strips should be left on the skin for 7-10 days.  If your  surgeon used skin glue on the incision, you may shower in 24 hours.  The glue will flake off over the next 2-3 weeks.  Any sutures or staples will be removed at the office during your follow-up visit. You may find that a light gauze bandage over your incision may keep your staples from being rubbed or pulled. You may shower and replace the bandage daily. °8. ACTIVITIES:  You may resume regular (light) daily activities beginning the next day--such as daily self-care, walking, climbing stairs--gradually increasing activities as tolerated.  You may have sexual intercourse when it is comfortable.  Refrain from any heavy lifting or straining until approved by your doctor. °a. You may drive when you no longer are taking prescription pain medication, you can comfortably wear a seatbelt, and you can safely maneuver your car and apply brakes °b. Return to Work: ___________________________________ °9. You should see your doctor in the office for a follow-up appointment approximately two weeks after your surgery.  Make sure that you call for this appointment within a day or two after you arrive home to insure a convenient appointment time. °OTHER INSTRUCTIONS:  °_____________________________________________________________ °_____________________________________________________________ ° °WHEN TO CALL YOUR DOCTOR: °1. Fever over 101.0 °2. Inability to urinate °3. Nausea and/or vomiting °4. Extreme swelling or bruising °5. Continued bleeding from incision. °6. Increased pain, redness, or drainage from the incision. °7. Difficulty swallowing or breathing °8. Muscle cramping or spasms. °9. Numbness or tingling in hands or feet or around lips. ° °The clinic staff is available to   answer your questions during regular business hours.  Please dont hesitate to call and ask to speak to one of the nurses if you have concerns.  For further questions, please visit [email or web address]  What are gastrointestinal stromal  tumors? Cancer starts when cells in the body begin to grow out of control. Cells in nearly any part of the body can become cancer, and can spread to other areas of the body. To learn more about how cancers start and spread, see What Is Cancer? Gastrointestinal stromal tumors start in the digestive system. To understand gastrointestinal stromal tumors, it helps to know something about the structure and function of the gastrointestinal system, also known as the digestive system. The gastrointestinal system The gastrointestinal (GI) system (or digestive system) processes food for energy and rids the body of solid waste. After food is chewed and swallowed, it enters the esophagus, a tube that carries food through the neck and chest to the stomach. The esophagus joins the stomach just beneath the diaphragm (the thin band of muscle below the lungs).  The stomach is a sac-like organ that holds food and helps the digestive process by secreting gastric juice. The food and gastric juices are mixed into a thick fluid called chyme that is then emptied into the small intestine. The small intestine continues breaking down the food and absorbs most of the nutrients into the bloodstream. This is the longest section of the GI tract, measuring more than 20 feet. The small intestine joins the large intestine, the first part of which is the colon, a muscular tube about 5 feet long. The colon absorbs water and mineral nutrients from the remaining food matter. The waste left after this process goes into the rectum as stool (feces), where it is stored until it passes out of the body through the anus. Gastrointestinal stromal tumors Gastrointestinal stromal tumors (GISTs) are uncommon tumors of the GI tract. These tumors start in very early forms of special cells found in the wall of the GI tract, called the interstitial cells of Caj[place] (ICCs). ICCs are cells of the autonomic nervous system, the part of the nervous system that  regulates body processes such as digesting food. ICCs are sometimes called the pacemakers of the GI tract because they signal the muscles in the digestive system to contract to move food and liquid through the GI tract. More than half of GISTs start in the stomach. Most of the others start in the small intestine, but GISTs can start anywhere along the GI tract. A small number of GISTs start outside the GI tract in nearby areas such as the omentum (an apron-like layer of fatty tissue that hangs over the organs in the abdomen) or the peritoneum (the layer of tissue that lines the organs and walls of the abdomen).  Not all GISTs are cancerous. Some are benign (not cancerous) and dont grow into other areas or spread to other parts of the body. Doctors have ways to find out whether a GIST is benign or cancerous. These are discussed in How are gastrointestinal stromal tumors diagnosed?  Other gastrointestinal tract cancers It is important to understand that GISTs are not the same as other, more common types of GI tract cancers.  Cancers can occur anywhere in the GI tract ? from the esophagus to the anus. Usually, these cancers start in glandular cells that line most of the GI tract. The cancers that develop in these cells are called adenocarcinomas. Most cancers of the GI tract, including those of  the esophagus, stomach, colon, and rectum, are adenocarcinomas. Some parts of the GI tract, like the upper part of the esophagus and the end of the anus, are lined with flat cells called squamous cells. These are the same type of cells found on the surface of the skin. Cancers starting in these cells are called squamous cell carcinomas. The GI tract also has neuroendocrine cells. These cells have some features in common with nerve cells but other features in common with hormone-producing (endocrine) cells. Cancers that develop from these cells are called neuroendocrine tumors. These cancers are rare in the GI tract.  Carcinoid tumors are an example of a neuroendocrine tumor found in the GI tract. Other rare types of cancer in the GI tract include leiomyosarcomas (cancers of smooth muscle cells), angiosarcomas (cancers of blood vessel cells), and peripheral nerve sheath tumors (cancers of cells that support and protect nerves).  GISTs are different from these other GI tract cancers. They start in different types of cells, need different types of treatment, and have a different prognosis (outlook). This is why doctors need to figure out whether a person with a tumor in the GI tract has a GIST, some other type of cancer, or a non-cancerous condition.  Chemotherapy for gastrointestinal stromal tumor Chemotherapy (chemo) is the use of drugs to treat cancer. Often, these drugs are injected into a vein (IV) or given by mouth. They enter the bloodstream and reach throughout the body, making this treatment potentially useful for cancers that have spread beyond the organ they started in.  Any drug used to treat cancer can be considered chemo - even the targeted therapy drugs discussed earlier. But the term chemo is often used to describe certain drugs that work by attacking quickly growing cells anywhere in the body, which includes cancer cells. Before targeted therapy drugs like imatinib (Gleevec) were found to be helpful in treating gastrointestinal stromal tumors (GISTs), traditional chemo drugs were used. GISTs rarely shrank in response to these drugs, so this type of treatment is rarely used now that targeted drugs are available. Because traditional chemo has not worked well against GISTs, no standard chemo regimens are recommended. Patients considering chemo may want to consider participating in a clinical trial.  Chemo drugs attack cells that are dividing quickly, which is why they work against cancer cells. But other cells in the body, such as those in the bone marrow (where new blood cells are made), the lining of the  mouth and intestines, and the hair follicles, also divide quickly. These cells can also be affected by chemo, which can lead to side effects. Side effects depend on the specific drugs used, their dose, and the length of treatment. Common side effects of chemo include: Nausea and vomiting  Loss of appetite  Mouth sores  Diarrhea  Hair loss  An increased chance of infection (from a shortage of white blood cells)  Problems with bleeding or bruising (from a shortage of blood platelets)  Fatigue or shortness of breath (from low red blood cell counts) Along with the risks above, some chemo drugs can cause other side effects. Most side effects improve once treatment is stopped, but some can last a long time. Let your health care team know if you have side effects, so they can be treated. There are ways to prevent or treat many of the side effects of chemotherapy. For example, many drugs can help prevent or treat nausea and vomiting.  For more information about chemotherapy, see A Guide to  Chemotherapy.   Hypothyroidism----this is a new diagnosis for you.  You will need to follow up with an endocrinologist.  We recommend Dr. Forde Dandy, however, if your primary care doctor wishes to treat you or refer you to someone else, that is okay.  If your insurance requires a referral, please see your primary care doctor who will refer you.   Stress steroids--you will take 20mg  today and 20mg  tomorrow 3/25.  Then you will take 10mg  for 3 days and then stop.  You will need to follow up with endocrinologist in a few weeks for further management and diagnosis of this.

## 2014-06-15 NOTE — Telephone Encounter (Signed)
error 

## 2014-06-15 NOTE — Discharge Summary (Signed)
Physician Discharge Summary  Miranda Howe MVH:[id number] DOB: 1971/02/20 DOA: 06/06/2014  PCP: Pcp Not In System  Consultation: oncology  Internal medicine  Critical care medicine   Admit date: 06/06/2014 Discharge date: 06/15/2014  Recommendations for Outpatient Follow-up:   Follow-up Information    Follow up with Sheela Stack, MD.   Specialty:  Endocrinology   Why:  CAll for an appointment in 3-4 weeks.  They stay busy so call when you get home.  If your Fitbit shows your heart rate staying low call for an appointment sooner.   Contact information:   [address] [place] [address] [phone]       Follow up with Edward Jolly, MD.   Specialty:  General Surgery   Why:  Make an appointment in 2 weeks for a post operative check with your surgeon.   Contact information:   [place] [address] [phone]       Follow up with Betsy Coder, MD. Schedule an appointment as soon as possible for a visit in 3 weeks.   Specialty:  Oncology   Why:  To follow up after your hospital stay.  Discuss need for chemotherapy / survival pathway   Contact information:   [place] [address] [phone]       Follow up with Sheela Stack, MD.   Specialty:  Endocrinology   Why:  3-4 weeks for thyroid follow up.  this is a endocrinologist   Contact information:   [address] [place] [address] [phone]      Discharge Diagnoses:  1. Intra-abdominal mass 2. Septic shock 3. Bradycardia 4. Post operative anemia 5. Hypothyroidism 6. Stress steroids    Surgical Procedure: exploratory laparotomy resection of abdominal mass and portion of small bowel---Dr. Hoxworth   Discharge Condition: stable Disposition: home  Diet recommendation: regular  Filed Weights   06/06/14 2322 06/07/14 1815 06/09/14 0400  Weight: 74.844 kg (165 lb) 79.379 kg (175 lb) 87 kg (191 lb 12.8 oz)    Filed Vitals:    06/15/14 0516  BP: 108/66  Pulse: 59  Temp: 98 F (36.7 C)  Resp: 20      Hospital Course:  Miranda Howe is a 44 year old healthy female who presented to [facility] with abdominal pain.  Her work up showed a large intra-abdominal mass suspicious for cancer.  She was found to be in shock and was admitted for IV hydration and surgery due to obstructive symptoms.  The following day, the patient underwent the surgery listed above.  Post operatively, she had hypotension and CCM was consulted.  She was kept in the ICU.  She was found to have a elevated TSH.  She was not started on medication for this.  She was recommended to follow up on OP basis for repeat TSH in 3-4 weeks.  She was started on steroids for cortisol of 16 per CCM.  Medicine was later consulted for weaning off steroids.  They also recommended follow up with Dr. Forde Dandy for further management.  Her taper will be complete on 3/28.  Her pathology showed perforated GIST.  Medical oncology reviewed the patient and she has a follow up established.  The patient stabilized, transferred to the floor and progressed.  Thereafter, she remained hemodynamically stable.  On POD#8 the patient was felt stable for discharge.  We thoroughly discussed her discharge plans and reviewed her hospital stay and recommended follow up.  Medication risks, benefits and therapeutic alternatives  were reviewed with the patient.  She/he verbalizes understanding.  She was encouraged to call with questions or concerns.   Physical Exam: General appearance: alert and oriented. Calm and cooperative No acute distress. VSS. Afebrile.  Resp: clear to auscultation bilaterally  Cardio: S1S1 RRR without murmurs or gallops. No edema. GI: soft round and nontender. +BS x4 quadrants. No organomegaly, hernias or masses. Incisions are cd/i.  Pulses: +2 bilateral distal pulses without cyanosis  Neurologic: Mental status: Alert, oriented, thought content appropriate    Discharge  Instructions     Medication List    TAKE these medications        acetaminophen 325 MG tablet  Commonly known as:  TYLENOL  Take 2 tablets (650 mg total) by mouth every 6 (six) hours as needed for mild pain, moderate pain, fever or headache.     citalopram 10 MG tablet  Commonly known as:  CELEXA  Take 10 mg by mouth daily.     hydrocortisone 10 MG tablet  Commonly known as:  CORTEF  Take 1 tablet (10 mg total) by mouth daily.  Start taking on:  06/17/2014     ibuprofen 200 MG tablet  Commonly known as:  ADVIL,MOTRIN  Take 400 mg by mouth every 6 (six) hours as needed for moderate pain.     multivitamin tablet  Take 1 tablet by mouth daily.     oxyCODONE 5 MG immediate release tablet  Commonly known as:  Oxy IR/ROXICODONE  Take 1-2 tablets (5-10 mg total) by mouth every 4 (four) hours as needed for moderate pain.           Follow-up Information    Follow up with Sheela Stack, MD.   Specialty:  Endocrinology   Why:  CAll for an appointment in 3-4 weeks.  They stay busy so call when you get home.  If your Fitbit shows your heart rate staying low call for an appointment sooner.   Contact information:   [address] [place] [address] [phone]       Follow up with Edward Jolly, MD.   Specialty:  General Surgery   Why:  Make an appointment in 2 weeks for a post operative check with your surgeon.   Contact information:   [place] [address] [phone]       Follow up with Betsy Coder, MD. Schedule an appointment as soon as possible for a visit in 3 weeks.   Specialty:  Oncology   Why:  To follow up after your hospital stay.  Discuss need for chemotherapy / survival pathway   Contact information:   [place] [address] [phone]       Follow up with Sheela Stack, MD.   Specialty:  Endocrinology   Why:  3-4 weeks for thyroid follow up.  this is a endocrinologist   Contact  information:   [address] [place] [address] [phone]        The results of significant diagnostics from this hospitalization (including imaging, microbiology, ancillary and laboratory) are listed below for reference.    Significant Diagnostic Studies: Dg Chest Port 1 View  06/11/2014   CLINICAL DATA:  Pulmonary edema  EXAM: PORTABLE CHEST - 1 VIEW  COMPARISON:  06/10/2014  FINDINGS: Nasogastric tube extends into the stomach. Basilar airspace consolidation persists, left greater than right, with slight improvement. There is a somewhat better aeration in the bases, particularly on the right. There is a small  left pleural effusion which is unchanged and a very small right effusion.  IMPRESSION: Slight improvement with better aeration in the bases although there is persistent airspace consolidation and pleural effusions.   Electronically Signed   By: Andreas Newport M.D.   On: 06/11/2014 07:03   Dg Chest Port 1 View  06/10/2014   CLINICAL DATA:  Respiratory failure  EXAM: PORTABLE CHEST - 1 VIEW  COMPARISON:  06/07/2014  FINDINGS: Nasogastric tube extends into the stomach. There is partial clearance of airspace opacities with residual basilar opacities and probable small pleural effusions. Moderate cardiomegaly, unchanged.  IMPRESSION: Partial clearance of the central and upper airspace opacities with residual basilar consolidation bilaterally.   Electronically Signed   By: Andreas Newport M.D.   On: 06/10/2014 07:04   Portable Chest Xray - Atelectasis  06/07/2014   CLINICAL DATA:  Shortness of breath after surgery  EXAM: PORTABLE CHEST - 1 VIEW  COMPARISON:  CT chest 06/07/2014  FINDINGS: Bilateral interstitial and alveolar airspace opacities. No pleural effusion or pneumothorax. Stable cardiomediastinal silhouette. Nasogastric tube coursing below the diaphragm.  The osseous structures are unremarkable.  IMPRESSION: Bilateral interstitial and alveolar airspace opacities concerning  for mild pulmonary edema.   Electronically Signed   By: Kathreen Devoid   On: 06/07/2014 16:26   Dg Chest Port 1 View  06/07/2014   CLINICAL DATA:  Acute onset of chest pain and shortness of breath. Back pain for 1 day. Initial encounter.  EXAM: PORTABLE CHEST - 1 VIEW  COMPARISON:  None.  FINDINGS: The lungs are well-aerated. Pulmonary vascularity is at the upper limits of normal. There is no evidence of focal opacification, pleural effusion or pneumothorax.  The cardiomediastinal silhouette is within normal limits. No acute osseous abnormalities are seen.  IMPRESSION: No acute cardiopulmonary process seen.   Electronically Signed   By: Garald Balding M.D.   On: 06/07/2014 01:20   Ct Angio Chest Aorta W/cm &/or Wo/cm  06/07/2014   CLINICAL DATA:  Acute onset of chest pain for 2 hours, radiating to the back. Nausea, vomiting, shortness of breath and diaphoresis. Elevated D-dimer. Arm tingling and leg weakness. Initial encounter.  EXAM: CT ANGIOGRAPHY CHEST, ABDOMEN AND PELVIS  TECHNIQUE: Multidetector CT imaging through the chest, abdomen and pelvis was performed using the standard protocol during bolus administration of intravenous contrast. Multiplanar reconstructed images and MIPs were obtained and reviewed to evaluate the vascular anatomy.  CONTRAST:  168mL OMNIPAQUE IOHEXOL 350 MG/ML SOLN  COMPARISON:  None.  FINDINGS: CTA CHEST FINDINGS  There is no evidence of aortic dissection. There is no evidence of aneurysmal dilatation. No calcific atherosclerotic disease is seen. The great vessels are unremarkable in appearance.  There is no evidence of pulmonary embolus.  Minimal bibasilar atelectasis is noted. Minimal nodularity at the lung apices likely also reflects atelectasis, though minimal infection might have a similar appearance. There is no evidence of pleural effusion or pneumothorax. No masses are identified; no abnormal focal contrast enhancement is seen.  The mediastinum is unremarkable in appearance.  A tiny calcified node is seen adjacent to the distal esophagus. There is question of mild distal esophageal wall thickening, which could reflect minimal esophagitis, or could remain within normal limits. No pericardial effusion is identified. No axillary lymphadenopathy is seen. The visualized portions of the thyroid gland are unremarkable in appearance.  Mild nodularity to the left fibroglandular tissue is thought to remain within normal limits. No suspicious soft tissue masses are seen within the chest.  No  acute osseous abnormalities are seen.  Review of the MIP images confirms the above findings.  CTA ABDOMEN AND PELVIS FINDINGS  There is no evidence of aortic dissection. There is no evidence of aneurysmal dilatation. No calcific atherosclerotic disease is seen. Incidental note is made of a circumaortic left renal vein. The celiac trunk, superior mesenteric artery, bilateral renal arteries and inferior mesenteric artery appear patent. The inferior vena cava is unremarkable in appearance.  There is a large complex mass at the left lower quadrant. It measures approximately 9.3 x 8.4 x 7.5 cm, with a large amount of fluid and air noted centrally, and a diffusely irregular peripheral soft tissue border. Mild surrounding soft tissue inflammation is seen. This is suggested to be contiguous with the adjacent mid jejunum.  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 Jejunal adenocarcinoma can mimic this appearance, and metastatic disease as might be seen with melanoma can also have such an appearance, though there are no additional findings seen to suggest melanoma.  A centrally necrotic tumor is thought to be less likely, given the relatively typical appearance for small bowel lymphoma. A walled-off abscess is also very unlikely, given recruitment of vasculature surrounding the mass, adjacent mild nodularity overlying the descending colon, and a  few prominent associated mesenteric nodes measuring up to 1.0 cm in size (best seen on coronal images).  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 However, the cecum and appendix are still noted on the right side.  There is a somewhat heterogeneous appearance to hepatic enhancement, which may reflect underlying fatty infiltration or mild venous congestion. No definite focal mass is seen. The spleen is unremarkable in appearance. The gallbladder is within normal limits. Hepatic hilar nodes remain normal in size. The pancreas and adrenal glands are unremarkable.  A 1.0 cm cyst is noted at the upper pole of the right kidney. The kidneys are otherwise unremarkable. There is no evidence of hydronephrosis. No renal or ureteral stones are seen. No perinephric stranding is appreciated.  Trace fluid within the pelvis may be physiologic in nature, or could be related to the left lower quadrant abdominal mass. The stomach is within normal limits.  The appendix is normal in caliber, without evidence for appendicitis. The colon is partially filled with stool and is unremarkable in appearance, aside from mild adjacent nodularity along the descending colon as described above, at the level of the mass.  The bladder is mildly distended and grossly unremarkable. The uterus has a somewhat heterogeneous appearance, but remains normal in size. This may reflect multiple tiny fibroids. The ovaries are relatively symmetric. No suspicious adnexal masses are seen. No inguinal lymphadenopathy is seen.  No acute osseous abnormalities are identified.  Review of the MIP images confirms the above findings.  IMPRESSION: 1. No evidence of aortic dissection. No evidence of aneurysmal dilatation. No calcific atherosclerotic disease seen. 2. Large complex 9.3 x 8.4 x 7.5 cm mass noted at the left lower quadrant. It contains a large amount of fluid and air, with a diffusely irregular  peripheral soft tissue or. Mild surrounding soft tissue inflammation seen. This is suggested to be contiguous with the adjacent mid ileum. 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 Jejunal adenocarcinoma can mimic this appearance, and metastatic disease as might be seen with melanoma can also have such an  appearance, though there are no additional findings to suggest melanoma. 3. There is recruitment of vasculature about the mass, mild nodularity overlying the adjacent descending colon, and a few prominent associated mesenteric nodes. These findings are highly suggestive of malignancy. 4. 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 However, the cecum and appendix are still seen on the right side. 5. Minimal nodularity at the lung apices likely reflects atelectasis, though minimal infection might have a similar appearance. Minimal bibasilar atelectasis noted. 6. Suggestion of mild distal esophageal wall thickening, which could reflect minimal esophagitis, or could remain within normal limits. 7. Circumaortic left renal vein incidentally noted. 8. Somewhat heterogeneous appearance to hepatic enhancement. This may reflect underlying fatty infiltration or mild venous congestion. No focal mass seen. 9. Small right renal cyst noted. 10. Trace free fluid within the pelvis may be physiologic in nature, or could be related to the left lower quadrant abdominal mass. 11. Heterogeneous appearance to the uterus may reflect multiple tiny fibroids.  These results were called by telephone at the time of interpretation on 06/07/2014 at 2:58 am to Dr. Veatrice Kells, who verbally acknowledged these results.   Electronically Signed   By: Garald Balding M.D.   On: 06/07/2014 03:55   Ct Angio Abd/pel W/ And/or W/o  06/07/2014   CLINICAL DATA:  Acute onset of chest pain for 2 hours,  radiating to the back. Nausea, vomiting, shortness of breath and diaphoresis. Elevated D-dimer. Arm tingling and leg weakness. Initial encounter.  EXAM: CT ANGIOGRAPHY CHEST, ABDOMEN AND PELVIS  TECHNIQUE: Multidetector CT imaging through the chest, abdomen and pelvis was performed using the standard protocol during bolus administration of intravenous contrast. Multiplanar reconstructed images and MIPs were obtained and reviewed to evaluate the vascular anatomy.  CONTRAST:  185mL OMNIPAQUE IOHEXOL 350 MG/ML SOLN  COMPARISON:  None.  FINDINGS: CTA CHEST FINDINGS  There is no evidence of aortic dissection. There is no evidence of aneurysmal dilatation. No calcific atherosclerotic disease is seen. The great vessels are unremarkable in appearance.  There is no evidence of pulmonary embolus.  Minimal bibasilar atelectasis is noted. Minimal nodularity at the lung apices likely also reflects atelectasis, though minimal infection might have a similar appearance. There is no evidence of pleural effusion or pneumothorax. No masses are identified; no abnormal focal contrast enhancement is seen.  The mediastinum is unremarkable in appearance. A tiny calcified node is seen adjacent to the distal esophagus. There is question of mild distal esophageal wall thickening, which could reflect minimal esophagitis, or could remain within normal limits. No pericardial effusion is identified. No axillary lymphadenopathy is seen. The visualized portions of the thyroid gland are unremarkable in appearance.  Mild nodularity to the left fibroglandular tissue is thought to remain within normal limits. No suspicious soft tissue masses are seen within the chest.  No acute osseous abnormalities are seen.  Review of the MIP images confirms the above findings.  CTA ABDOMEN AND PELVIS FINDINGS  There is no evidence of aortic dissection. There is no evidence of aneurysmal dilatation. No calcific atherosclerotic disease is seen. Incidental note is made  of a circumaortic left renal vein. The celiac trunk, superior mesenteric artery, bilateral renal arteries and inferior mesenteric artery appear patent. The inferior vena cava is unremarkable in appearance.  There is a large complex mass at the left lower quadrant. It measures approximately 9.3 x 8.4 x 7.5 cm, with a large amount of fluid and air noted  centrally, and a diffusely irregular peripheral soft tissue border. Mild surrounding soft tissue inflammation is seen. This is suggested to be contiguous with the adjacent mid jejunum.  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 Jejunal adenocarcinoma can mimic this appearance, and metastatic disease as might be seen with melanoma can also have such an appearance, though there are no additional findings seen to suggest melanoma.  A centrally necrotic tumor is thought to be less likely, given the relatively typical appearance for small bowel lymphoma. A walled-off abscess is also very unlikely, given recruitment of vasculature surrounding the mass, adjacent mild nodularity overlying the descending colon, and a few prominent associated mesenteric nodes measuring up to 1.0 cm in size (best seen on coronal images).  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 However, the cecum and appendix are still noted on the right side.  There is a somewhat heterogeneous appearance to hepatic enhancement, which may reflect underlying fatty infiltration or mild venous congestion. No definite focal mass is seen. The spleen is unremarkable in appearance. The gallbladder is within normal limits. Hepatic hilar nodes remain normal in size. The pancreas and adrenal glands are unremarkable.  A 1.0 cm cyst is noted at the upper pole of the right kidney. The kidneys are otherwise unremarkable. There is no evidence of hydronephrosis. No renal or ureteral  stones are seen. No perinephric stranding is appreciated.  Trace fluid within the pelvis may be physiologic in nature, or could be related to the left lower quadrant abdominal mass. The stomach is within normal limits.  The appendix is normal in caliber, without evidence for appendicitis. The colon is partially filled with stool and is unremarkable in appearance, aside from mild adjacent nodularity along the descending colon as described above, at the level of the mass.  The bladder is mildly distended and grossly unremarkable. The uterus has a somewhat heterogeneous appearance, but remains normal in size. This may reflect multiple tiny fibroids. The ovaries are relatively symmetric. No suspicious adnexal masses are seen. No inguinal lymphadenopathy is seen.  No acute osseous abnormalities are identified.  Review of the MIP images confirms the above findings.  IMPRESSION: 1. No evidence of aortic dissection. No evidence of aneurysmal dilatation. No calcific atherosclerotic disease seen. 2. Large complex 9.3 x 8.4 x 7.5 cm mass noted at the left lower quadrant. It contains a large amount of fluid and air, with a diffusely irregular peripheral soft tissue or. Mild surrounding soft tissue inflammation seen. This is suggested to be contiguous with the adjacent mid ileum. 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 Jejunal adenocarcinoma can mimic this appearance, and metastatic disease as might be seen with melanoma can also have such an appearance, though there are no additional findings to suggest melanoma. 3. There is recruitment of vasculature about the mass, mild nodularity overlying the adjacent descending colon, and a few prominent associated mesenteric nodes. These findings are highly suggestive of malignancy. 4. 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 However, the  cecum and appendix are still seen on the right side. 5. Minimal nodularity at the lung apices likely reflects atelectasis, though minimal infection might have a similar appearance. Minimal bibasilar atelectasis noted. 6. Suggestion of mild distal esophageal wall thickening, which could reflect minimal esophagitis,  or could remain within normal limits. 7. Circumaortic left renal vein incidentally noted. 8. Somewhat heterogeneous appearance to hepatic enhancement. This may reflect underlying fatty infiltration or mild venous congestion. No focal mass seen. 9. Small right renal cyst noted. 10. Trace free fluid within the pelvis may be physiologic in nature, or could be related to the left lower quadrant abdominal mass. 11. Heterogeneous appearance to the uterus may reflect multiple tiny fibroids.  These results were called by telephone at the time of interpretation on 06/07/2014 at 2:58 am to Dr. Veatrice Kells, who verbally acknowledged these results.   Electronically Signed   By: Garald Balding M.D.   On: 06/07/2014 03:55    Microbiology: Recent Results (from the past 240 hour(s))  Tissue culture     Status: None   Collection Time: 06/07/14 12:15 PM  Result Value Ref Range Status   Specimen Description TISSUE SOFT TISSUE. BOWEL RESECTION  Final   Special Requests NONE  Final   Gram Stain   Final    RARE WBC PRESENT,BOTH PMN AND MONONUCLEAR FEW GRAM NEGATIVE RODS RARE GRAM POSITIVE COCCI IN PAIRS Performed at [facility]    Culture   Final    NO GROWTH 3 DAYS Performed at [facility]    Report Status 06/10/2014 FINAL  Final  Anaerobic culture     Status: None   Collection Time: 06/07/14  1:39 PM  Result Value Ref Range Status   Specimen Description TISSUE SOFT TISSUE. BOWEL RESECTION  Final   Special Requests NONE  Final   Gram Stain   Final    RARE WBC PRESENT,BOTH PMN AND MONONUCLEAR FEW GRAM NEGATIVE RODS RARE GRAM POSITIVE COCCI IN PAIRS Performed at [facility]    Culture   Final    NO ANAEROBES ISOLATED Performed at [facility]    Report Status 06/12/2014 FINAL  Final  MRSA PCR Screening     Status: None   Collection Time: 06/07/14  6:30 PM  Result Value Ref Range Status   MRSA by PCR NEGATIVE NEGATIVE Final    Comment:        The GeneXpert MRSA Assay (FDA approved for NASAL specimens only), is one component of a comprehensive MRSA colonization surveillance program. It is not intended to diagnose MRSA infection nor to guide or monitor treatment for MRSA infections.      Labs: Basic Metabolic Panel:  Recent Labs Lab 06/08/14 1510 06/09/14 0403 06/10/14 0739 06/11/14 0336 06/12/14 0541  NA 142 142 142 140 139  K 3.8 4.1 3.5 3.4* 3.9  CL 111 111 109 106 105  CO2 23 25 24 23 21   GLUCOSE 108* 109* 78 68* 98  BUN 24* 31* 28* 19 21  CREATININE 0.96 1.04 0.89 0.74 0.79  CALCIUM 7.3* 7.4* 7.5* 7.9* 8.0*  MG 1.6 1.9 1.8 1.8 2.0  PHOS 3.4 3.4 2.5 2.6 3.8   Liver Function Tests:  Recent Labs Lab 06/09/14 0403 06/11/14 0336  AST 32 23  ALT 29 23  ALKPHOS 92 149*  BILITOT 0.9 1.2  PROT 4.9* 5.3*  ALBUMIN 2.4* 2.3*   No results for input(s): LIPASE, AMYLASE in the last 168 hours. No results for input(s): AMMONIA in the last 168 hours. CBC:  Recent Labs Lab 06/09/14 0403 06/10/14 0739 06/11/14 0336 06/13/14 0517  WBC 26.0* 17.1* 18.3* 13.8*  NEUTROABS  --   --  13.4*  --   HGB 7.5* 8.5* 9.4* 8.5*  HCT 24.9* 28.1*  31.0* 27.9*  MCV 75.0* 74.7* 74.0* 72.7*  PLT 228 286 313 317   Cardiac Enzymes:  Recent Labs Lab 06/08/14 1510 06/09/14 0403  TROPONINI 0.04* 0.03   BNP: BNP (last 3 results)  Recent Labs  06/09/14 0403  BNP 386.3*    ProBNP (last 3 results) No results for input(s): PROBNP in the last 8760 hours.  CBG: No results for input(s): GLUCAP in the last 168 hours.  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

## 2014-06-15 NOTE — Telephone Encounter (Signed)
Met with patient and her husband, Merry Proud in hospital room and provided information on GIST tumors and treatments. Provided resource book of web sites for support and information. Briefly discussed path report and her tumor grade and T4 stage. Explained reason for additional genetic/molecular testing being done on her tumor and how that impacts therapy. Suggested she concentrate on balance of rest and activity and push protein in her diet to heal. Gave her appointment information to see Dr. Burr Medico on April 11th along with contact information. Explained role of GI Oncology Nurse Navigator. She appreciated the visit. Hopes to go home later today.

## 2014-06-16 LAB — THYROID STIMULATING IMMUNOGLOBULIN: TSI: 22 % baseline (ref <140)

## 2014-07-03 ENCOUNTER — Other Ambulatory Visit: Payer: Self-pay | Admitting: *Deleted

## 2014-07-03 ENCOUNTER — Ambulatory Visit: Payer: 59

## 2014-07-03 ENCOUNTER — Encounter: Payer: Self-pay | Admitting: *Deleted

## 2014-07-03 ENCOUNTER — Encounter: Payer: Self-pay | Admitting: Hematology

## 2014-07-03 ENCOUNTER — Ambulatory Visit (HOSPITAL_BASED_OUTPATIENT_CLINIC_OR_DEPARTMENT_OTHER): Payer: 59 | Admitting: Hematology

## 2014-07-03 VITALS — BP 110/63 | HR 62 | Temp 98.8°F | Resp 18 | Ht 63.0 in | Wt 151.8 lb

## 2014-07-03 DIAGNOSIS — C494 Malignant neoplasm of connective and soft tissue of abdomen: Secondary | ICD-10-CM

## 2014-07-03 DIAGNOSIS — F419 Anxiety disorder, unspecified: Secondary | ICD-10-CM

## 2014-07-03 DIAGNOSIS — C49A3 Gastrointestinal stromal tumor of small intestine: Secondary | ICD-10-CM

## 2014-07-03 DIAGNOSIS — C784 Secondary malignant neoplasm of small intestine: Secondary | ICD-10-CM | POA: Diagnosis not present

## 2014-07-03 NOTE — [facility] Oncology Navigator Note (Signed)
Met with patient and husband during new patient visit. Explained the role of the GI Nurse Navigator and provided New Patient Packet with information on: 1.  GIST cancer 2. Support groups 3. Advanced Directives 4. Fall Safety Plan Answered questions, reviewed current treatment plan using TEACH back and provided emotional support. Provided copy of current treatment plan(see media). Will make referral to genetics counselor and have managed care follow up on coverage for molecular testing on her tumor.  Susan Coward, RN, BSN GI Oncology Navigator [facility] 

## 2014-07-03 NOTE — Progress Notes (Signed)
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 Recruitment of vasculature about the mass, mild nodularity overlying the adjacent descending colon.   06/07/2014 Pathologic Stage pT4,pNX / GI:low grade; mitotic rate 5/50   06/07/2014 Surgery Exploratory laparotomy resection of abdominal mass and portion of small bowel    HISTORY OF PRESENTING ILLNESS:  Miranda Howe 44 y.o. female is here because of recently diagnosed GIST.   She had scatter intermittent low mid and RLQ abdominal pain and nausea and vomiting since early this year and some weight loss 15 lbs (some intensional), she was initially developed with her gynecologist and workup was negative except a cervical polyps. She presented to [facility] ED on March15 2016 with sudden onset of severe abdominal pain, shaking chills. CT scan of the abdomen showed a large intra-abdominal mass, she was treated with IV hydration and antibiotics for septic shock. She underwent urgent exploratory laparotomy and  resection of the small bowel mass on 06/07/2014 by Dr. Excell Seltzer. According to the operation note, the mass is located in the proximal jejunum, and there was an obvious opening on the and 10 mesenteric site of jejunum into the mass, possibly a diverticulum or perforating into the mass. There is no free fluid in the abdomen. She tolerated the procedure well and no was subsequently discharged home on 06/15/2014.  She has been recovering well from the surgery, off pain meds for 5-6 days, more physically active now, she has good appetite and eating well, BM is normal, no blood in stool.  she is able to do some light housework and activities, but has not been back to her for activities. She is planning to return to work on 08/03/2014. 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    MEDICAL HISTORY:   anxiety   SURGICAL HISTORY: Past Surgical History  Procedure Laterality Date  . Wisdom tooth extraction    . Cervical polypectomy  2010  . Bartholin gland cyst excision  01/17/2009    I&D  . Laparotomy N/A 06/07/2014    Procedure: EXPLORATORY LAPAROTOMY RESECTION OF ABDOMINAL MASS AND PORTION OF SMALL BOWEL;  Surgeon: Excell Seltzer, MD;  Location: [facility];  Service: General;  Laterality: N/A;    SOCIAL HISTORY: History   Social History  . Marital Status: Married    Spouse Name: N/A  . Number of Children: 2 children, age of 32 and 63   . Years of Education: N/A   Occupational History  . Not on file.   Social History Main Topics  . Smoking status: Never Smoker   . Smokeless tobacco: Never Used  . Alcohol  Use: Yes     Comment: socially  . Drug Use: No  . Sexual Activity: Yes    Birth Control/ Protection: Condom, Other-see comments     Comment: pt use gel    Other Topics Concern  . 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 Heart  disease Father   . Hypertension Father   . Other Mother     varicose veins  . Cancer Mother   . Hypertension Mother   . Diabetes Mother   . Breast cancer Mother 57  . Lupus Cousin     ALLERGIES:  is allergic to sulfa antibiotics.  MEDICATIONS:  Current Outpatient Prescriptions  Medication Sig Dispense Refill  . citalopram (CELEXA) 10 MG tablet Take 10 mg by mouth daily.    Marland Kitchen ibuprofen (ADVIL,MOTRIN) 200 MG tablet Take 400 mg by mouth every 6 (six) hours as needed for moderate pain.    . Multiple Vitamin (MULTIVITAMIN) tablet Take 1 tablet by mouth daily.    Marland Kitchen acetaminophen (TYLENOL) 325 MG tablet Take 2 tablets (650 mg total) by mouth every 6 (six) hours as needed for mild pain, moderate pain, fever or headache. (Patient not taking: Reported on 07/03/2014)    . oxyCODONE (OXY IR/ROXICODONE) 5 MG immediate release tablet Take 1-2 tablets (5-10 mg total) by mouth every 4 (four) hours as needed for moderate pain. (Patient not taking: Reported on 07/03/2014) 50 tablet 0   No current facility-administered medications for this visit.    REVIEW OF SYSTEMS:   Constitutional: Denies fevers, chills or abnormal night sweats, (+) weight loss  Eyes: Denies blurriness of vision, double vision or watery eyes Ears, nose, mouth, throat, and face: Denies mucositis or sore throat Respiratory: Denies cough, dyspnea or wheezes Cardiovascular: Denies palpitation, chest discomfort or lower extremity swelling Gastrointestinal:  Denies nausea, heartburn or change in bowel habits Skin: Denies abnormal skin rashes Lymphatics: Denies new lymphadenopathy or easy bruising Neurological:Denies numbness, tingling or new weaknesses Behavioral/Psych: Mood is stable, no new changes  All other systems were reviewed with the patient and are negative.  PHYSICAL EXAMINATION: ECOG PERFORMANCE STATUS: 1 - Symptomatic but completely ambulatory  Filed Vitals:   07/03/14 1455  BP: 110/63  Pulse: 62  Temp: 98.8 F  (37.1 C)  Resp: 18   Filed Weights   07/03/14 1455  Weight: 151 lb 12.8 oz (68.856 kg)    GENERAL:alert, no distress and comfortable SKIN: skin color, texture, turgor are normal, no rashes or significant lesions EYES: normal, conjunctiva are pink and non-injected, sclera clear OROPHARYNX:no exudate, no erythema and lips, buccal mucosa, and tongue normal  NECK: supple, thyroid normal size, non-tender, without nodularity LYMPH:  no palpable lymphadenopathy in the cervical, axillary or inguinal LUNGS: clear to auscultation and percussion with normal breathing effort HEART: regular rate & rhythm and no murmurs and no lower extremity edema ABDOMEN:abdomen soft, non-tender and normal bowel sounds Musculoskeletal:no cyanosis of digits and no clubbing  PSYCH: alert & oriented x 3 with fluent speech NEURO: no focal motor/sensory deficits  LABORATORY DATA:  I have reviewed the data as listed Lab Results  Component Value Date   WBC 13.8* 06/13/2014   HGB 8.5* 06/13/2014   HCT 27.9* 06/13/2014   MCV 72.7* 06/13/2014   PLT 317 06/13/2014    Recent Labs  06/07/14 0046  06/09/14 0403 06/10/14 0739 06/11/14 0336 06/12/14 0541  NA  --   < > 142 142 140 139  K  --   < > 4.1 3.5 3.4* 3.9  CL  --   < > 111 109 106 105  CO2  --   < > $R'25 24 23 21  'rg$ GLUCOSE  --   < > 109* 78 68* 98  BUN  --   < > 31* 28* 19 21  CREATININE  --   < > 1.04 0.89 0.74 0.79  CALCIUM  --   < > 7.4* 7.5* 7.9* 8.0*  GFRNONAA  --   < > 64* 78* >90 >90  GFRAA  --   < > 75* >90 >90 >90  PROT 7.8  --  4.9*  --  5.3*  --   ALBUMIN 3.9  --  2.4*  --  2.3*  --   AST 44*  --  32  --  23  --   ALT 31  --  29  --  23  --   ALKPHOS 207*  --  92  --  149*  --   BILITOT 0.6  --  0.9  --  1.2  --   BILIDIR 0.2  --   --   --   --   --   IBILI 0.4  --   --   --   --   --   < > = values in this interval not displayed.  PATHOLOGY REPORT Diagnosis 06/07/2014 Small intestine, resection for tumor, Abdominal mass -  GASTROINTESTINAL STROMAL TUMOR, 10.2 CM WITH CENTRAL NECROTIC CAVITY. - SEE ONCOLOGY TABLE. Microscopic Comment GASTROINTESTINAL STROMAL TUMOR (GIST): Procedure: Resection. Tumor Site: Small bowel. Tumor Size: 10.2 cm. Tumor Focality: Unifocal. GIST Subtype: Spindle cell. Mitotic Rate: 2/50 HIGH POWER FIELD Necrosis: Present, central. Histologic Grade: G1: Low grade; mitotic rate 5/50 HIGH POWER FIELD. Risk Assessment: High risk. Margins: Free of tumor. Distance of tumor from closest margin: Less than 0.2 cm. Ancillary testing: Immunohistochemistry. Lymph nodes: number examined: 0; number positive: N/A Pathologic Staging: pT4, pNX Comments: The tumor is composed of spindle cells with minimal to mild cytologic atypia and rare mitotic figures (2 per 50 high power field). Much of the central portion of the tumor is cavitary with associated necrosis and the overlying small bowel mucosa is ulcerated and communicates with the central cavity within the tumor. The tumor extends to the subserosal connective tissue and is focally adjacent to the visceral peritoneum. Immunohistochemistry shows strong positivity with CD117 and smooth muscle actin and negative staining with S100, desmin and muscle specific actin. (JDP:ecj 06/09/2014)   RADIOGRAPHIC STUDIES: I have personally reviewed the radiological images as listed and agreed with the findings in the report.  Ct Angio chest, Abd/pel W/ And/or W/o 06/07/2014   IMPRESSION: 1. No evidence of aortic dissection. No evidence of aneurysmal dilatation. No calcific atherosclerotic disease seen. 2. Large complex 9.3 x 8.4 x 7.5 cm mass noted at the left lower quadrant. It contains a large amount of fluid and air, with a diffusely irregular peripheral soft tissue or. Mild surrounding soft tissue inflammation seen. This is suggested to be contiguous with the adjacent mid ileum. 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 Jejunal adenocarcinoma can mimic this appearance, and metastatic disease as might be seen with melanoma can also have such an appearance, though there are no additional findings to  suggest melanoma. 3. There is recruitment of vasculature about the mass, mild nodularity overlying the adjacent descending colon, and a few prominent associated mesenteric nodes. These findings are highly suggestive of malignancy. 4. 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 However, the cecum and appendix are still seen on the right side. 5. Minimal nodularity at the lung apices likely reflects atelectasis, though minimal infection might have a similar appearance. Minimal bibasilar atelectasis noted. 6. Suggestion of mild distal esophageal wall thickening, which could reflect minimal esophagitis, or could remain within normal limits. 7. Circumaortic left renal vein incidentally noted. 8. Somewhat heterogeneous appearance to hepatic enhancement. This may reflect underlying fatty infiltration or mild venous congestion. No focal mass seen. 9. Small right renal cyst noted. 10. Trace free fluid within the pelvis may be physiologic in nature, or could be related to the left lower quadrant abdominal mass. 11. Heterogeneous appearance to the uterus may reflect multiple tiny fibroids.  These results were called by telephone at the time of interpretation on 06/07/2014 at 2:58 am to Dr. Veatrice Kells, who verbally acknowledged these results.   Electronically Signed   By: Garald Balding M.D.   On: 06/07/2014 03:55    ASSESSMENT & PLAN:  44 year old Caucasian female without significant past medical history except anxiety who was found to have a 10 cm proximal jejunal mass, with perforation.   1. 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 -Giving the size of her tumor, this is stage III. Although it's low grade, giving the large size and perforation, this is consider a high risk tumor, the risk of recurrence is about 50%.   -The nature of the disease was reviewed with patient and her husband in great details, patient had many questions and I answered to her satisfaction. -I discussed the standard care is adjuvant Gleevec for 3 years to reduce the risk of recurrence in the future. -The benefit and side effects of Gleevec, such as nausea, diarrhea, low blood counts, fluid retention including pleural effusion, CHF, etc. were discussed with patient in great details, patient agrees to proceed. -I'll send the [place] prescription for 400 mg once daily to [facility] -I also recommend to check c-kit and PDGFRA a gene mutations, if her insurance covers it, to see if her tumor contains any c-kit gene mutations which will predict resistant to [place]. -She is still recovering from surgery, I'll give her 2 more weeks to recover. I'll call her in 2 weeks to discuss her genetic test results and start her on [place] if she recovers well. -We also discussed at great detail about surveillance. Per NCCN, patient will be seeing an exam every 3-6 months for 5 years, then annually. CT scan will be obtained every 3-6 months for 5 years, then annually.  2. Anxiety -Continue Celexa.  All questions were answered. The patient knows to call the clinic with any problems, questions or concerns. I spent 60 minutes counseling the patient face to face. The total time spent in the appointment was 80 minutes and more than 50% was on counseling.     Truitt Merle, MD 07/03/2014 3:44 PM

## 2014-07-03 NOTE — Progress Notes (Signed)
Checked in new pt with no financial concerns prior to seeing the dr.  Pt has my card for any billing questions or concerns. ° °

## 2014-07-04 ENCOUNTER — Encounter: Payer: Self-pay | Admitting: Hematology

## 2014-07-04 ENCOUNTER — Other Ambulatory Visit: Payer: Self-pay | Admitting: *Deleted

## 2014-07-04 ENCOUNTER — Telehealth: Payer: Self-pay | Admitting: Hematology

## 2014-07-04 MED ORDER — IMATINIB MESYLATE 400 MG PO TABS: 400.0000 mg | ORAL_TABLET | Freq: Every day | ORAL | Status: DC

## 2014-07-04 NOTE — Progress Notes (Signed)
I faxed prior auth request to [facility] for imatinib mesylate

## 2014-07-04 NOTE — Progress Notes (Signed)
Per [facility]... POIPPG:[id number];YOFVWAQ Name:ST: Gleevec - *[place]*;Status:Approved;Coverage Start Date:06/04/2014;Coverage End Date:07/03/2017;

## 2014-07-04 NOTE — Telephone Encounter (Signed)
Patient confirmed appointment April/May. Mailed calendar.

## 2014-07-05 ENCOUNTER — Telehealth: Payer: Self-pay | Admitting: *Deleted

## 2014-07-05 NOTE — Telephone Encounter (Signed)
Confirmed with [facility] that [place] was ordered and will arrive sometime today. They will call patient when it arrives. Her monthly co-pay is $10.

## 2014-07-05 NOTE — Telephone Encounter (Signed)
Notified patient that her molecular testing on tumor was approved by [facility] and informed her her co-pay for chemo is $10. Pharmacy will call when ready for pick up. She will need to attend the chemo class and MD review her baseline labs before starting the drug.

## 2014-07-05 NOTE — Telephone Encounter (Signed)
Faxed approval for C-Kit/PDGFRA testing with authorization #[id number]. Valid 07/04/13 -10/03/14. Emailed request for mutation testing to [facility].

## 2014-07-05 NOTE — Telephone Encounter (Signed)
Call to [facility] for approval for c-Kit [phone]) and PDGFRA ([id number]) mutation testing. 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 Should receive fax in next 48 hours with determination of benefits.

## 2014-07-07 ENCOUNTER — Other Ambulatory Visit (HOSPITAL_COMMUNITY)
Admission: RE | Admit: 2014-07-07 | Discharge: 2014-07-07 | Disposition: A | Discharge status: Home / Self Care | Payer: 59 | Source: Ambulatory Visit | Attending: Hematology | Admitting: Hematology

## 2014-07-07 DIAGNOSIS — C179 Malignant neoplasm of small intestine, unspecified: Secondary | ICD-10-CM | POA: Diagnosis present

## 2014-07-11 ENCOUNTER — Other Ambulatory Visit: Payer: 59

## 2014-07-11 ENCOUNTER — Encounter: Payer: Self-pay | Admitting: *Deleted

## 2014-07-11 ENCOUNTER — Other Ambulatory Visit (HOSPITAL_BASED_OUTPATIENT_CLINIC_OR_DEPARTMENT_OTHER): Payer: 59

## 2014-07-11 DIAGNOSIS — C494 Malignant neoplasm of connective and soft tissue of abdomen: Secondary | ICD-10-CM

## 2014-07-11 DIAGNOSIS — C784 Secondary malignant neoplasm of small intestine: Secondary | ICD-10-CM

## 2014-07-11 DIAGNOSIS — C49A3 Gastrointestinal stromal tumor of small intestine: Secondary | ICD-10-CM

## 2014-07-11 LAB — CBC & DIFF AND RETIC
BASO%: 0.5 % (ref 0.0–2.0)
BASOS ABS: 0 10*3/uL (ref 0.0–0.1)
EOS%: 2.8 % (ref 0.0–7.0)
Eosinophils Absolute: 0.2 10*3/uL (ref 0.0–0.5)
HCT: 35.4 % (ref 34.8–46.6)
HEMOGLOBIN: 11 g/dL — AB (ref 11.6–15.9)
Immature Retic Fract: 8.4 % (ref 1.60–10.00)
LYMPH%: 30 % (ref 14.0–49.7)
MCH: 24.9 pg — AB (ref 25.1–34.0)
MCHC: 31.1 g/dL — AB (ref 31.5–36.0)
MCV: 80.3 fL (ref 79.5–101.0)
MONO#: 0.8 10*3/uL (ref 0.1–0.9)
MONO%: 9.5 % (ref 0.0–14.0)
NEUT%: 57.2 % (ref 38.4–76.8)
NEUTROS ABS: 4.6 10*3/uL (ref 1.5–6.5)
Platelets: 269 10*3/uL (ref 145–400)
RBC: 4.41 10*6/uL (ref 3.70–5.45)
RDW: 18.8 % — ABNORMAL HIGH (ref 11.2–14.5)
Retic %: 1.94 % (ref 0.70–2.10)
Retic Ct Abs: 85.55 10*3/uL (ref 33.70–90.70)
WBC: 8 10*3/uL (ref 3.9–10.3)
lymph#: 2.4 10*3/uL (ref 0.9–3.3)

## 2014-07-11 LAB — COMPREHENSIVE METABOLIC PANEL (CC13)
ALBUMIN: 4 g/dL (ref 3.5–5.0)
ALT: 31 U/L (ref 0–55)
ANION GAP: 13 meq/L — AB (ref 3–11)
AST: 27 U/L (ref 5–34)
Alkaline Phosphatase: 94 U/L (ref 40–150)
BUN: 13.6 mg/dL (ref 7.0–26.0)
CHLORIDE: 106 meq/L (ref 98–109)
CO2: 20 mEq/L — ABNORMAL LOW (ref 22–29)
Calcium: 9.6 mg/dL (ref 8.4–10.4)
Creatinine: 0.7 mg/dL (ref 0.6–1.1)
GLUCOSE: 84 mg/dL (ref 70–140)
Potassium: 4.1 mEq/L (ref 3.5–5.1)
SODIUM: 139 meq/L (ref 136–145)
Total Bilirubin: 0.34 mg/dL (ref 0.20–1.20)
Total Protein: 7.2 g/dL (ref 6.4–8.3)

## 2014-07-13 ENCOUNTER — Other Ambulatory Visit: Payer: Self-pay | Admitting: *Deleted

## 2014-07-13 ENCOUNTER — Telehealth: Payer: Self-pay | Admitting: *Deleted

## 2014-07-13 DIAGNOSIS — C49A3 Gastrointestinal stromal tumor of small intestine: Secondary | ICD-10-CM

## 2014-07-13 NOTE — Telephone Encounter (Signed)
Provided patient with oral report on her CBC/Cmet results. MD wants to wait for C-kit results before starting [place], which should be back in next 2 business days per pathology department. Dr. Burr Medico wants to order ferritin for her next appointment. Patient reports she saw Dr. Forde Dandy today re: thyroid and had testing done again. Talked her through how to look up lab results in My Chart.

## 2014-07-13 NOTE — Telephone Encounter (Signed)
Called to inquire about her lab results from 07/10/14 and if MD wants her to begin her [place] now? Also asking how to find results on MyChart ? Message to Dr. Ernestina Penna nurse regarding lab results. Made her aware that results are automatically released in 5 business days unless the MD releases them manually. Will follow up with her on this.

## 2014-07-14 ENCOUNTER — Telehealth: Payer: Self-pay | Admitting: Hematology

## 2014-07-14 NOTE — Telephone Encounter (Signed)
Appointments made for lab and patient is aware

## 2014-07-18 ENCOUNTER — Encounter (HOSPITAL_COMMUNITY): Payer: Self-pay

## 2014-07-18 ENCOUNTER — Telehealth: Payer: Self-pay | Admitting: Hematology

## 2014-07-18 NOTE — Telephone Encounter (Signed)
I called patient's home and mobile phones, and left a message regarding her tumor c-kit mutation test. I told her to start [place] this week if she has recovered well from the surgery. I told her to call back and a possible reschedule her next follow-up visit from 07/25/2014 to 2-3 weeks later.   Miranda Howe  07/18/2014

## 2014-07-20 ENCOUNTER — Other Ambulatory Visit: Payer: Self-pay | Admitting: *Deleted

## 2014-07-20 ENCOUNTER — Ambulatory Visit (HOSPITAL_BASED_OUTPATIENT_CLINIC_OR_DEPARTMENT_OTHER): Payer: 59 | Admitting: Genetic Counselor

## 2014-07-20 ENCOUNTER — Other Ambulatory Visit: Payer: 59

## 2014-07-20 ENCOUNTER — Telehealth: Payer: Self-pay | Admitting: Hematology

## 2014-07-20 ENCOUNTER — Encounter: Payer: Self-pay | Admitting: Genetic Counselor

## 2014-07-20 DIAGNOSIS — Z800 Family history of malignant neoplasm of digestive organs: Secondary | ICD-10-CM | POA: Diagnosis not present

## 2014-07-20 DIAGNOSIS — C494 Malignant neoplasm of connective and soft tissue of abdomen: Secondary | ICD-10-CM

## 2014-07-20 NOTE — [facility] Oncology Navigator Note (Signed)
Took copy of Dr. Ernestina Penna phone note and copy of C-Kit mutation testing to genetics counselor office to forward to [place] when she comes in today for her appointment. POF sent to scheduler.

## 2014-07-20 NOTE — Telephone Encounter (Signed)
Lft msg for pt confirming labs/ov r/s from 05/03 to 05/24 per 04/28 POF, sent msg to pt on my chart and mailed out schedule..... KJ

## 2014-07-20 NOTE — Progress Notes (Signed)
Patient Name: Miranda Howe Patient Age: 44 y.o. Encounter Date: 07/20/2014  Referring Physician: Truitt Merle, MD  Primary Care Provider: Pcp Not In System    HISTORY OF PRESENT ILLNESS: Ms. Miranda Howe, a 44 y.o. female, is being seen at the [facility] due to a personal history of GIST tumor and family history of colorectal cancer. She presents to clinic today with her husband to discuss the possibility of a hereditary predisposition to cancer and discuss whether genetic testing is warranted.  Miranda Howe stated that she has a yearly mammogram, clinical breast exam and gynecologic exam.  Oncology History   GIST (gastrointestinal stromal tumor) of jejunum with perforation, s/p SB resection 06/07/2014   Staging form: Small Intestine, AJCC 7th Edition     Pathologic stage from 07/03/2014: Stage Unknown (T4, NX, cM0) - Unsigned       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 Recruitment of vasculature about the mass, mild nodularity overlying the adjacent descending colon.   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 This predicts good response to Tyrosine kinase inhibitor.    Past Medical History  Diagnosis Date  . Anxiety   . Family history of colon cancer     Past Surgical History  Procedure Laterality Date  . Wisdom tooth extraction    . Cervical polypectomy  2010  . Bartholin gland cyst excision  01/17/2009    I&D  . Laparotomy N/A 06/07/2014    Procedure: EXPLORATORY LAPAROTOMY RESECTION OF  ABDOMINAL MASS AND PORTION OF SMALL BOWEL;  Surgeon: Excell Seltzer, MD;  Location: [facility];  Service: General;  Laterality: N/A;    History   Social History  . Marital Status: Married    Spouse Name: N/A  . Number of Children: N/A  . Years of Education: N/A   Social History Main Topics  . Smoking status: Never Smoker   . Smokeless tobacco: Never Used  . Alcohol Use: Yes     Comment: socially  . Drug Use: No  . Sexual Activity: Yes    Birth Control/ Protection: Condom, Other-see comments     Comment: pt use gel    Other Topics Concern  . Not on file   Social History Narrative   Married, husband Psychologist, clinical   Employed as elementary school principle   # 2 children     FAMILY HISTORY:   During the visit, a 4-generation pedigree was obtained. Family tree will be sent for scanning and will be in EPIC under the Media tab.  Significant diagnoses include the following:  Family History  Problem Relation Age of Onset  . Heart disease Father   . Hypertension Father   . Cancer Father 63    colon cancer; currently 68  . Other Mother     varicose veins  . Cancer Mother 44    breast cancer; currently 2  . Hypertension Mother   . Diabetes Mother   . Lupus Cousin   . Cancer Paternal Grandfather     colon cancer (age?); deceased  . Cancer Maternal Aunt     uterine  cancer ~67; deceased 63s  . Cancer Paternal Uncle     pancreatic; deceased 9s  . Cancer Paternal Uncle     colon cancer 11s; currently 76  . Cancer Paternal Uncle     colon cancer 48s; currently 74s    Additionally, Miranda Howe has a daughter (age 51) and a son (age 76). She has 3 brothers and 2 sisters (ages 50-55) who are all cancer-free.  Miranda Howe ancestry is Caucasian - NOS. There is no known Jewish ancestry and no consanguinity.  ASSESSMENT AND PLAN: Ms. Laprade is a 44 y.o. female with a personal history of GIST and family history of primarily colon cancers. We reviewed the characteristics, features and  inheritance patterns of hereditary cancer syndromes. We specifically discussed that most GIST tumors are not due to a hereditary predisposition. Ms. Drahos has no other family history of GIST or other tumors that may be related to hereditary GIST. While there are a couple of genes associated with hereditary GIST (c-kit and PDGFRA) as well as genes for other conditions that may include GIST (NF1 and family of SDH genes), genetic testing of these is not indicated as she has no other features suggestive of having a mutation in one of these genes. We discussed genetic testing in general, including the appropriate family members to test, the process of testing, insurance coverage and implications of results.   Ms. Almario did wish to pursue genetic testing to address the family history of colorectal cancers. A blood sample will be sent to [facility] for analysis of the 17 genes on the ColoNext gene panel (APC, BMPR1A, CDH1, CHEK2, EPCAM, GREM1, MLH1, MSH2, MSH6, MUTYH, PMS2, POLD1, POLE, PTEN, SMAD4, STK11, and TP53). We discussed the implications of a positive, negative and/ or Variant of Uncertain Significance (VUS) result. Results should be available in approximately 4 weeks, at which point we will contact her and address implications for her as well as address genetic testing for at-risk family members, if needed.    We encouraged Ms. Hollin to remain in contact with Cancer Genetics annually so that we can update the family history and inform her of any changes in cancer genetics and testing that may be of benefit for this family. Ms.  Vannostrand questions were answered to her satisfaction today.   Thank you for the referral and allowing [place] to share in the care of your patient.   The patient was seen for a total of 40 minutes, greater than 50% of which was spent face-to-face counseling. This patient was discussed with the overseeing provider who agrees with the above.   Steele Berg, MS, [place] Certified  Genetic Counselor phone: [phone] [email or web address]

## 2014-07-25 ENCOUNTER — Other Ambulatory Visit: Payer: 59

## 2014-07-25 ENCOUNTER — Ambulatory Visit: Payer: 59 | Admitting: Hematology

## 2014-07-28 ENCOUNTER — Telehealth: Payer: Self-pay | Admitting: *Deleted

## 2014-07-28 NOTE — Telephone Encounter (Signed)
Husband called and left VM to follow up on the status of the paperwork he left to be completed for Miranda Howe to be out of work until 08/28/14. Her current leave ends on 5/15. Please call him or the patient, Miranda Howe [phone]) of status. Sent this message to [employer] in managed care.

## 2014-07-31 ENCOUNTER — Telehealth: Payer: Self-pay | Admitting: *Deleted

## 2014-07-31 NOTE — Telephone Encounter (Signed)
Husband called to follow up on the forms he left last week. Confirmed with Raquel in managed care that the forms are not ready yet, and she will call him when they are completed. Instructed him he may call her directly in future to follow up on this. Inquired how Miranda Howe is doing on the [place] thus far? He feels she is doing well, just tired.

## 2014-08-01 ENCOUNTER — Encounter: Payer: Self-pay | Admitting: Hematology

## 2014-08-01 NOTE — Progress Notes (Signed)
I sent staff message to dr. Burr Medico and thu to see if she can do a letter for the patient to be out of work until 08/28/14 instead of 08/06/14. I am to call the patient when done [phone] or [phone]

## 2014-08-02 ENCOUNTER — Encounter: Payer: Self-pay | Admitting: Hematology

## 2014-08-02 NOTE — Progress Notes (Signed)
I faxed letter/office notes to disability [employer]ms [phone]. I called to let the patient know it was faxed and that I will mail her a copy.

## 2014-08-08 ENCOUNTER — Telehealth: Payer: Self-pay | Admitting: *Deleted

## 2014-08-08 ENCOUNTER — Encounter: Payer: Self-pay | Admitting: Genetic Counselor

## 2014-08-08 ENCOUNTER — Telehealth: Payer: Self-pay | Admitting: Hematology

## 2014-08-08 DIAGNOSIS — Z1379 Encounter for other screening for genetic and chromosomal anomalies: Secondary | ICD-10-CM | POA: Insufficient documentation

## 2014-08-08 DIAGNOSIS — Z1211 Encounter for screening for malignant neoplasm of colon: Secondary | ICD-10-CM | POA: Insufficient documentation

## 2014-08-08 NOTE — Progress Notes (Signed)
Referring Provider: Truitt Merle, MD   Ms. Roebuck was seen in the [facility] on 07/20/14 due to a personal history of GIST and family history of colon cancer and concern regarding a hereditary predisposition to cancer in the family. Please refer to the prior Genetics clinic note for more information regarding Ms. Nuzzo's medical and family histories and our assessment at the time.   GENETIC TESTING: At the time of Ms. Devincent's visit, we recommended she pursue genetic testing of the multiple genes on the ColoNext gene panel. This test, which included sequencing and deletion/duplication analysis of 17 genes, was performed at [facility]. 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 The genes tested were APC, BMPR1A, CDH1, CHEK2, EPCAM, GREM1, MLH1, MSH2, MSH6, MUTYH, PMS2, POLD1, POLE, PTEN, SMAD4, STK11, and TP53.  We discussed that this is one of the two founder mutations in the MUTYH gene that are present in 1-2% of individuals of [place] descent. If an individual inherits two pathogenic mutations, they have a recessive form of hereditary colonic polyposis. There is conflicting data regarding whether a single MUTYH mutation confers a moderate increased risk (up to 2-fold) for colorectal cancer. We discussed that this result does not necessarily explain Ms. Macwilliams's family history of colon cancer and should not be over interpreted. She understood that it does not explain her GIST.  MEDICAL MANAGEMENT: We emphasized that being a carrier of a single MUTYH mutaton is not thought to significantly increase cancer risk. Ms. Sauser should continue to follow cancer screening guidelines provided by the overseeing providers. We did discuss for her to have a colonoscopy, mainly due to her family history of colon cancer.  FAMILY MEMBERS: It is important that Ms. Donaghue informs her relatives of these results, but it is not necessarily recommended that they pursue testing  themselves. They are recommended to speak with a genetic counselor prior to any testing. A genetic counselor can be located at [email or web address].    Ms. Pester children have a 50% chance to have inherited this mutation. However, this is of no consequence to them and they are not recommended to get tested at this time.  We discussed that one option would be for her spouse to pursue testing to see whether he is a carrier of a MUTYH mutation as well. Only then would their children be at risk of having MUTYH-associated polyposis.  We encouraged Ms. Harts to remain in contact with [place] on an annual basis so we can update her personal and family histories, and let her know of advances in cancer genetics that may benefit the family. Our contact number was provided. Ms. Freimuth questions were answered to her satisfaction today, and she knows she is welcome to call anytime with additional questions.    Steele Berg, MS, [place] Certified Genetic Counselor phone: [phone] [email or web address]

## 2014-08-08 NOTE — Telephone Encounter (Signed)
Left VM reporting she is having stomach cramping pain on and off for past 2-3 days. Called and spoke w/patient:stomach pain started Friday night-so intense she was crying. Bowels are moving normally. No diarrhea or bleeding. No swelling, reports her stomach area feels "tender" to touch. Pain almost feels like a labor pain-how it starts and gets worse and worse. On call service told her to take her oxycodone over weekend and she did in the evening. Pain seems to get worse at night. Tried some Tums, but it does not feel like heartburn. She is eating small frequent meals. Taking her Gleevec in the evening. Asking what she should do?

## 2014-08-08 NOTE — Telephone Encounter (Signed)
I returned her call regarding her abdominal pain. She did not answer and I left a message. Her abdominal pain is possibly related to [place], I told her to stop if the pain persists. I encouraged her to call back and discuss further.  Truitt Merle  08/08/2014

## 2014-08-15 ENCOUNTER — Encounter: Payer: Self-pay | Admitting: Hematology

## 2014-08-15 ENCOUNTER — Telehealth: Payer: Self-pay | Admitting: Hematology

## 2014-08-15 ENCOUNTER — Ambulatory Visit (HOSPITAL_BASED_OUTPATIENT_CLINIC_OR_DEPARTMENT_OTHER): Payer: 59 | Admitting: Hematology

## 2014-08-15 ENCOUNTER — Other Ambulatory Visit: Payer: Self-pay | Admitting: Hematology

## 2014-08-15 ENCOUNTER — Other Ambulatory Visit (HOSPITAL_BASED_OUTPATIENT_CLINIC_OR_DEPARTMENT_OTHER): Payer: 59

## 2014-08-15 VITALS — BP 114/64 | HR 68 | Temp 98.2°F | Resp 18 | Ht 63.0 in | Wt 157.7 lb

## 2014-08-15 DIAGNOSIS — F419 Anxiety disorder, unspecified: Secondary | ICD-10-CM

## 2014-08-15 DIAGNOSIS — C784 Secondary malignant neoplasm of small intestine: Secondary | ICD-10-CM | POA: Diagnosis not present

## 2014-08-15 DIAGNOSIS — C49A3 Gastrointestinal stromal tumor of small intestine: Secondary | ICD-10-CM

## 2014-08-15 DIAGNOSIS — D500 Iron deficiency anemia secondary to blood loss (chronic): Secondary | ICD-10-CM

## 2014-08-15 DIAGNOSIS — C494 Malignant neoplasm of connective and soft tissue of abdomen: Secondary | ICD-10-CM | POA: Diagnosis not present

## 2014-08-15 LAB — CBC & DIFF AND RETIC
BASO%: 1.2 % (ref 0.0–2.0)
Basophils Absolute: 0.1 10*3/uL (ref 0.0–0.1)
EOS%: 2.5 % (ref 0.0–7.0)
Eosinophils Absolute: 0.1 10*3/uL (ref 0.0–0.5)
HEMATOCRIT: 34.9 % (ref 34.8–46.6)
HEMOGLOBIN: 11 g/dL — AB (ref 11.6–15.9)
Immature Retic Fract: 1.9 % (ref 1.60–10.00)
LYMPH#: 2 10*3/uL (ref 0.9–3.3)
LYMPH%: 35.8 % (ref 14.0–49.7)
MCH: 25.9 pg (ref 25.1–34.0)
MCHC: 31.5 g/dL (ref 31.5–36.0)
MCV: 82.3 fL (ref 79.5–101.0)
MONO#: 0.5 10*3/uL (ref 0.1–0.9)
MONO%: 8.3 % (ref 0.0–14.0)
NEUT#: 3 10*3/uL (ref 1.5–6.5)
NEUT%: 52.2 % (ref 38.4–76.8)
Platelets: 263 10*3/uL (ref 145–400)
RBC: 4.24 10*6/uL (ref 3.70–5.45)
RDW: 16.5 % — AB (ref 11.2–14.5)
RETIC %: 1.03 % (ref 0.70–2.10)
Retic Ct Abs: 43.67 10*3/uL (ref 33.70–90.70)
WBC: 5.7 10*3/uL (ref 3.9–10.3)

## 2014-08-15 LAB — COMPREHENSIVE METABOLIC PANEL (CC13)
ALK PHOS: 95 U/L (ref 40–150)
ALT: 17 U/L (ref 0–55)
AST: 21 U/L (ref 5–34)
Albumin: 4 g/dL (ref 3.5–5.0)
Anion Gap: 10 mEq/L (ref 3–11)
BUN: 13.3 mg/dL (ref 7.0–26.0)
CO2: 23 meq/L (ref 22–29)
Calcium: 9.1 mg/dL (ref 8.4–10.4)
Chloride: 105 mEq/L (ref 98–109)
Creatinine: 0.9 mg/dL (ref 0.6–1.1)
EGFR: 76 mL/min/{1.73_m2} — AB (ref >90)
Glucose: 108 mg/dl (ref 70–140)
POTASSIUM: 4.3 meq/L (ref 3.5–5.1)
Sodium: 139 mEq/L (ref 136–145)
TOTAL PROTEIN: 6.9 g/dL (ref 6.4–8.3)
Total Bilirubin: 0.41 mg/dL (ref 0.20–1.20)

## 2014-08-15 LAB — FERRITIN CHCC: FERRITIN: 16 ng/mL (ref 9–269)

## 2014-08-15 MED ORDER — FERROUS SULFATE 325 (65 FE) MG PO TABS: 325.0000 mg | ORAL_TABLET | Freq: Two times a day (BID) | ORAL | Status: DC

## 2014-08-15 NOTE — Telephone Encounter (Signed)
per pof to sch pt appt-gave pt copy of sch °

## 2014-08-15 NOTE — Progress Notes (Signed)
[place]  [phone] Fax:([phone]) Miranda Howe Note   Patient Care Team: Pcp Not In System as PCP - General Miranda Regal, PA-C as Physician Assistant (Obstetrics and Gynecology) Miranda Seltzer, MD as Consulting Physician (General Surgery) Miranda Merle, MD as Consulting Physician ([place]) Miranda Ade, RN as Registered Nurse (Medical Oncology) 08/15/2014  CHIEF COMPLAINTS/PURPOSE OF CONSULTATION:  GIST   Oncology History   GIST (gastrointestinal stromal tumor) of jejunum with perforation, s/p SB resection 06/07/2014   Staging form: Small Intestine, AJCC 7th Edition     Pathologic stage from 07/03/2014: Stage Unknown (T4, NX, cM0) - Unsigned       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 Recruitment of vasculature about the mass, mild nodularity overlying the adjacent descending colon.   06/07/2014 Pathologic Stage pT4,pNX / GI:low grade; mitotic rate 5/50   06/07/2014 Surgery Exploratory laparotomy resection of abdominal mass and portion of small bowel   06/07/2014 Miscellaneous tumor was tested for c-kit gene mutation, (+) p.T056 deletion/insertion NP. This predicts good response to Tyrosine kinase inhibitor.    HISTORY OF PRESENTING ILLNESS:  Miranda Howe 44 y.o. female is here because of recently diagnosed GIST.   She had scatter intermittent low mid and RLQ abdominal pain and nausea and vomiting since early this year and some weight loss 15 lbs (some intensional), she was initially developed with her gynecologist and workup was negative except a cervical polyps. She [facility] ED on March15 2016 with sudden onset of severe abdominal pain, shaking chills. CT scan of the  abdomen showed a large intra-abdominal mass, she was treated with IV hydration and antibiotics for septic shock. She underwent urgent exploratory laparotomy and resection of the small bowel mass on 06/07/2014 by Dr. Excell Howe. According to the operation note, the mass is located in the proximal jejunum, and there was an obvious opening on the and 10 mesenteric site of jejunum into the mass, possibly a diverticulum or perforating into the mass. There is no free fluid in the abdomen. She tolerated the procedure well and no was subsequently discharged home on 06/15/2014.  She has been recovering well from the surgery, off pain meds for 5-6 days, more physically active now, she has good appetite and eating well, BM is normal, no blood in stool.  she is able to do some light housework and activities, but has not been back to her for activities. She is planning to return to work on 08/03/2014. 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    MEDICAL HISTORY:  [name] returns for follow-up. She continues recovering from her surgery. She still has mild-to-moderate fatigue, able to do daily activity, but has not back to work yet. She started Gleevec about one month ago, initially tolerated well, but had an episode of intermittent epigastric pain for 3-4 days, without significant nausea or diarrhea or fever, the pain resolved on his own. And she restarted Gleevec afterwards. She so far doing well with the pill. No other noticeable side effects. She has good appetite, she gained some wieght back.     SURGICAL HISTORY: Past Surgical History  Procedure Laterality Date  . Wisdom tooth extraction    . Cervical polypectomy  2010  . Bartholin gland cyst excision  01/17/2009    I&D  . Laparotomy N/A 06/07/2014    Procedure: EXPLORATORY LAPAROTOMY RESECTION OF ABDOMINAL MASS AND PORTION OF SMALL BOWEL;  Surgeon: Miranda Seltzer, MD;  Location: [facility];  Service: General;  Laterality: N/A;    SOCIAL  HISTORY: History   Social History  . Marital Status: Married    Spouse Name: N/A  . Number of Children: 2 children, age of 84 and 42   . Years of Education: N/A   Occupational History  . Not on file.   Social History Main Topics  . Smoking status: Never Smoker   . Smokeless tobacco: Never Used  . Alcohol Use: Yes     Comment: socially  . Drug Use: No  . Sexual Activity: Yes    Birth Control/ Protection: Condom, Other-see comments     Comment: pt use gel    Other Topics Concern  . 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 Heart disease Father   . Hypertension Father   . Cancer Father 35    colon cancer; currently 32  . Other Mother     varicose veins  . Cancer Mother 2    breast cancer; currently 39  . Hypertension Mother   . Diabetes Mother   . Lupus Cousin   . Cancer Paternal Grandfather     colon cancer (age?); deceased  . Cancer Maternal Aunt     uterine cancer ~67; deceased 58s  . Cancer Paternal Uncle     pancreatic; deceased 66s  . Cancer Paternal Uncle     colon cancer 5s; currently 50  . Cancer Paternal Uncle     colon cancer 58s; currently 20s    ALLERGIES:  is allergic to sulfa antibiotics.  MEDICATIONS:  Current Outpatient Prescriptions  Medication Sig Dispense Refill  . citalopram (CELEXA) 10 MG tablet Take 10 mg by mouth daily.    Marland Kitchen imatinib (GLEEVEC) 400 MG tablet Take 1 tablet (400 mg total) by mouth daily. Take with meals and large glass of water.Caution:Chemotherapy. 30 tablet 5  . Multiple Vitamin (MULTIVITAMIN) tablet Take 1 tablet by mouth daily.    Marland Kitchen acetaminophen (TYLENOL) 325 MG tablet Take 2 tablets (650 mg total) by mouth every 6 (six) hours as needed for mild pain, moderate pain, fever or headache. (Patient not taking: Reported on 07/03/2014)    . ibuprofen (ADVIL,MOTRIN) 200 MG  tablet Take 400 mg by mouth every 6 (six) hours as needed for moderate pain.    Marland Kitchen oxyCODONE (OXY IR/ROXICODONE) 5 MG immediate release tablet Take 1-2 tablets (5-10 mg total) by mouth every 4 (four) hours as needed for moderate pain. (Patient not taking: Reported on 07/03/2014) 50 tablet 0   No current facility-administered medications for this visit.    REVIEW OF SYSTEMS:   Constitutional: Denies fevers, chills or abnormal night sweats, (+) weight loss  Eyes: Denies blurriness of vision, double vision or watery eyes Ears, nose, mouth, throat, and face: Denies mucositis or sore throat Respiratory: Denies cough, dyspnea or wheezes Cardiovascular: Denies palpitation, chest discomfort or lower extremity swelling Gastrointestinal:  Denies nausea, heartburn or change in bowel habits Skin: Denies abnormal skin rashes Lymphatics: Denies new lymphadenopathy or easy bruising Neurological:Denies numbness, tingling or new weaknesses Behavioral/Psych: Mood is stable, no new changes  All other systems were reviewed  with the patient and are negative.  PHYSICAL EXAMINATION: ECOG PERFORMANCE STATUS: 1 - Symptomatic but completely ambulatory  Filed Vitals:   08/15/14 1346  BP: 114/64  Pulse: 68  Temp: 98.2 F (36.8 C)  Resp: 18   Filed Weights   08/15/14 1346  Weight: 157 lb 11.2 oz (71.532 kg)    GENERAL:alert, no distress and comfortable SKIN: skin color, texture, turgor are normal, no rashes or significant lesions EYES: normal, conjunctiva are pink and non-injected, sclera clear OROPHARYNX:no exudate, no erythema and lips, buccal mucosa, and tongue normal  NECK: supple, thyroid normal size, non-tender, without nodularity LYMPH:  no palpable lymphadenopathy in the cervical, axillary or inguinal LUNGS: clear to auscultation and percussion with normal breathing effort HEART: regular rate & rhythm and no murmurs and no lower extremity edema ABDOMEN:abdomen soft, non-tender and normal bowel  sounds. Midline surgical scar is well-healed.  Musculoskeletal:no cyanosis of digits and no clubbing  PSYCH: alert & oriented x 3 with fluent speech NEURO: no focal motor/sensory deficits  LABORATORY DATA:  I have reviewed the data as listed Lab Results  Component Value Date   WBC 5.7 08/15/2014   HGB 11.0* 08/15/2014   HCT 34.9 08/15/2014   MCV 82.3 08/15/2014   PLT 263 08/15/2014    Recent Labs  06/07/14 0046  06/10/14 0739 06/11/14 0336 06/12/14 0541 07/11/14 1333 08/15/14 1322  NA  --   < > 142 140 139 139 139  K  --   < > 3.5 3.4* 3.9 4.1 4.3  CL  --   < > 109 106 105  --   --   CO2  --   < > _0 20* 23  GLUCOSE  --   < > 78 68* 98 84 108  BUN  --   < > 28* 19 21 13.6 13.3  CREATININE  --   < > 0.89 0.74 0.79 0.7 0.9  CALCIUM  --   < > 7.5* 7.9* 8.0* 9.6 9.1  GFRNONAA  --   < > 78* >90 >90  --   --   GFRAA  --   < > >90 >90 >90  --   --   PROT 7.8  < >  --  5.3*  --  7.2 6.9  ALBUMIN 3.9  < >  --  2.3*  --  4.0 4.0  AST 44*  < >  --  23  --  27 21  ALT 31  < >  --  23  --  31 17  ALKPHOS 207*  < >  --  149*  --  94 95  BILITOT 0.6  < >  --  1.2  --  0.34 0.41  BILIDIR 0.2  --   --   --   --   --   --   IBILI 0.4  --   --   --   --   --   --   < > = values in this interval not displayed.  PATHOLOGY REPORT Diagnosis 06/07/2014 Small intestine, resection for tumor, Abdominal mass - GASTROINTESTINAL STROMAL TUMOR, 10.2 CM WITH CENTRAL NECROTIC CAVITY. - SEE ONCOLOGY TABLE. Microscopic Comment GASTROINTESTINAL STROMAL TUMOR (GIST): Procedure: Resection. Tumor Site: Small bowel. Tumor Size: 10.2 cm. Tumor Focality: Unifocal. GIST Subtype: Spindle cell. Mitotic Rate: 2/50 HIGH POWER FIELD Necrosis: Present, central. Histologic Grade: G1: Low grade; mitotic rate 5/50 HIGH POWER FIELD. Risk Assessment: High risk. Margins: Free of tumor. Distance of tumor  from closest margin: Less than 0.2 cm. Ancillary testing: Immunohistochemistry. Lymph nodes: number  examined: 0; number positive: N/A Pathologic Staging: pT4, pNX Comments: The tumor is composed of spindle cells with minimal to mild cytologic atypia and rare mitotic figures (2 per 50 high power field). Much of the central portion of the tumor is cavitary with associated necrosis and the overlying small bowel mucosa is ulcerated and communicates with the central cavity within the tumor. The tumor extends to the subserosal connective tissue and is focally adjacent to the visceral peritoneum. Immunohistochemistry shows strong positivity with CD117 and smooth muscle actin and negative staining with S100, desmin and muscle specific actin. (JDP:ecj 06/09/2014)    RADIOGRAPHIC STUDIES: I have personally reviewed the radiological images as listed and agreed with the findings in the report.  Ct Angio chest, Abd/pel W/ And/or W/o 06/07/2014   IMPRESSION: 1. No evidence of aortic dissection. No evidence of aneurysmal dilatation. No calcific atherosclerotic disease seen. 2. Large complex 9.3 x 8.4 x 7.5 cm mass noted at the left lower quadrant. It contains a large amount of fluid and air, with a diffusely irregular peripheral soft tissue or. Mild surrounding soft tissue inflammation seen. This is suggested to be contiguous with the adjacent mid ileum. 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 Jejunal adenocarcinoma can mimic this appearance, and metastatic disease as might be seen with melanoma can also have such an appearance, though there are no additional findings to suggest melanoma. 3. There is recruitment of vasculature about the mass, mild nodularity overlying the adjacent descending colon, and a few prominent associated mesenteric nodes. These findings are highly suggestive of malignancy. 4. 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 However, the cecum and  appendix are still seen on the right side. 5. Minimal nodularity at the lung apices likely reflects atelectasis, though minimal infection might have a similar appearance. Minimal bibasilar atelectasis noted. 6. Suggestion of mild distal esophageal wall thickening, which could reflect minimal esophagitis, or could remain within normal limits. 7. Circumaortic left renal vein incidentally noted. 8. Somewhat heterogeneous appearance to hepatic enhancement. This may reflect underlying fatty infiltration or mild venous congestion. No focal mass seen. 9. Small right renal cyst noted. 10. Trace free fluid within the pelvis may be physiologic in nature, or could be related to the left lower quadrant abdominal mass. 11. Heterogeneous appearance to the uterus may reflect multiple tiny fibroids.  These results were called by telephone at the time of interpretation on 06/07/2014 at 2:58 am to Dr. Veatrice Kells, who verbally acknowledged these results.   Electronically Signed   By: Garald Balding M.D.   On: 06/07/2014 03:55    ASSESSMENT & PLAN:  44 year old Caucasian female without significant past medical history except anxiety who was found to have a 10 cm proximal jejunal mass, with perforation.   1. 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 -Giving the size of her tumor, this is stage III. Although it's low grade, giving the large size and perforation, this is consider a high risk tumor, the risk of recurrence is about 50%.   -The nature of the disease was reviewed with patient and her husband in great details, patient had many questions  and I answered to her satisfaction. -I discussed the standard care is adjuvant Gleevec for 3 years to reduce the risk of recurrence in the future. -The benefit and side effects of Gleevec, such as nausea,  diarrhea, low blood counts, fluid retention including pleural effusion, CHF, etc. were discussed with patient in great details, patient agrees to proceed. -I'll send the [place] prescription for 400 mg once daily to [facility] -her tumor is c-kit (+) -She tolerating Gleevec well, without significant side effects. We'll continue -We also discussed at great detail about surveillance. Per NCCN, patient will be seeing an exam every 3-6 months for 5 years, then annually. CT scan will be obtained every 3-6 months for 5 years, then annually.  2. Anxiety -Continue Celexa.  Plan -Return to clinic in 3 months with lab -We'll order restaging CT scan on next visit   All questions were answered. The patient knows to call the clinic with any problems, questions or concerns. I spent 20 minutes counseling the patient face to face. The total time spent in the appointment was 25 minutes and more than 50% was on counseling.     Miranda Merle, MD 08/15/2014 3:07 PM

## 2014-08-29 ENCOUNTER — Encounter: Payer: Self-pay | Admitting: *Deleted

## 2014-08-29 ENCOUNTER — Telehealth: Payer: Self-pay | Admitting: *Deleted

## 2014-08-29 NOTE — Telephone Encounter (Signed)
Oncology Nurse Navigator Documentation  Oncology Nurse Navigator Flowsheets 08/29/2014  Navigator Encounter Type Telephone- 1 month F/U  Treatment Phase Treatment-po Gleevec  Barriers/Navigation Needs Family concerns-needs return to work letter for 09/07/14.  Time Spent with Patient 10  Reports no further abdominal cramping, no diarrhea or vomiting with Gleevec. Feels she is tolerating it well.

## 2014-11-21 ENCOUNTER — Ambulatory Visit (HOSPITAL_BASED_OUTPATIENT_CLINIC_OR_DEPARTMENT_OTHER): Payer: 59 | Admitting: Hematology

## 2014-11-21 ENCOUNTER — Telehealth: Payer: Self-pay | Admitting: Hematology

## 2014-11-21 ENCOUNTER — Encounter: Payer: Self-pay | Admitting: Hematology

## 2014-11-21 ENCOUNTER — Other Ambulatory Visit (HOSPITAL_BASED_OUTPATIENT_CLINIC_OR_DEPARTMENT_OTHER): Payer: 59

## 2014-11-21 VITALS — BP 111/61 | HR 61 | Temp 98.5°F | Resp 18 | Ht 63.0 in | Wt 164.0 lb

## 2014-11-21 DIAGNOSIS — C49A3 Gastrointestinal stromal tumor of small intestine: Secondary | ICD-10-CM

## 2014-11-21 DIAGNOSIS — F419 Anxiety disorder, unspecified: Secondary | ICD-10-CM | POA: Diagnosis not present

## 2014-11-21 DIAGNOSIS — C784 Secondary malignant neoplasm of small intestine: Secondary | ICD-10-CM

## 2014-11-21 DIAGNOSIS — C494 Malignant neoplasm of connective and soft tissue of abdomen: Secondary | ICD-10-CM | POA: Diagnosis not present

## 2014-11-21 LAB — COMPREHENSIVE METABOLIC PANEL (CC13)
ALBUMIN: 4 g/dL (ref 3.5–5.0)
ALK PHOS: 79 U/L (ref 40–150)
ALT: 21 U/L (ref 0–55)
AST: 21 U/L (ref 5–34)
Anion Gap: 9 mEq/L (ref 3–11)
BILIRUBIN TOTAL: 0.42 mg/dL (ref 0.20–1.20)
BUN: 18.1 mg/dL (ref 7.0–26.0)
CALCIUM: 9.2 mg/dL (ref 8.4–10.4)
CO2: 22 mEq/L (ref 22–29)
Chloride: 109 mEq/L (ref 98–109)
Creatinine: 0.9 mg/dL (ref 0.6–1.1)
EGFR: 73 mL/min/{1.73_m2} — ABNORMAL LOW (ref >90)
GLUCOSE: 123 mg/dL (ref 70–140)
Potassium: 3.8 mEq/L (ref 3.5–5.1)
Sodium: 139 mEq/L (ref 136–145)
TOTAL PROTEIN: 6.7 g/dL (ref 6.4–8.3)

## 2014-11-21 LAB — CBC & DIFF AND RETIC
BASO%: 0.3 % (ref 0.0–2.0)
Basophils Absolute: 0 10*3/uL (ref 0.0–0.1)
EOS ABS: 0.1 10*3/uL (ref 0.0–0.5)
EOS%: 2.1 % (ref 0.0–7.0)
HEMATOCRIT: 33.4 % — AB (ref 34.8–46.6)
HEMOGLOBIN: 11.2 g/dL — AB (ref 11.6–15.9)
IMMATURE RETIC FRACT: 4.4 % (ref 1.60–10.00)
LYMPH%: 35 % (ref 14.0–49.7)
MCH: 29.6 pg (ref 25.1–34.0)
MCHC: 33.5 g/dL (ref 31.5–36.0)
MCV: 88.4 fL (ref 79.5–101.0)
MONO#: 0.4 10*3/uL (ref 0.1–0.9)
MONO%: 5.7 % (ref 0.0–14.0)
NEUT%: 56.9 % (ref 38.4–76.8)
NEUTROS ABS: 3.8 10*3/uL (ref 1.5–6.5)
Platelets: 228 10*3/uL (ref 145–400)
RBC: 3.78 10*6/uL (ref 3.70–5.45)
RDW: 17.4 % — ABNORMAL HIGH (ref 11.2–14.5)
RETIC %: 1.49 % (ref 0.70–2.10)
Retic Ct Abs: 56.32 10*3/uL (ref 33.70–90.70)
WBC: 6.7 10*3/uL (ref 3.9–10.3)
lymph#: 2.4 10*3/uL (ref 0.9–3.3)

## 2014-11-21 MED ORDER — IMATINIB MESYLATE 400 MG PO TABS: 400.0000 mg | ORAL_TABLET | Freq: Every day | ORAL | Status: DC

## 2014-11-21 NOTE — Progress Notes (Signed)
[facility] [facility]  [phone]) [phone] Fax:([phone]) [place] Note   Patient Care Team: Pcp Not In System as PCP - General Earnstine Regal, PA-C as Physician Assistant (Obstetrics and Gynecology) Excell Seltzer, MD as Consulting Physician (General Surgery) Truitt Merle, MD as Consulting Physician (Medical Oncology) Tania Ade, RN as Registered Nurse (Medical Oncology) 11/21/2014  CHIEF COMPLAINTS/PURPOSE OF CONSULTATION:  GIST   Oncology History   GIST (gastrointestinal stromal tumor) of jejunum with perforation, s/p SB resection 06/07/2014   Staging form: Small Intestine, AJCC 7th Edition     Pathologic stage from 07/03/2014: Stage Unknown (T4, NX, cM0) - Unsigned       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 Recruitment of vasculature about the mass, mild nodularity overlying the adjacent descending colon.   06/07/2014 Pathologic Stage pT4,pNX / GI:low grade; mitotic rate 5/50   06/07/2014 Surgery Exploratory laparotomy resection of abdominal mass and portion of small bowel   06/07/2014 Miscellaneous tumor was tested for c-kit gene mutation, (+) p.Y637 deletion/insertion NP. This predicts good response to Tyrosine kinase inhibitor.   07/18/2014 -  Chemotherapy Gleevec 400 mg po    HISTORY OF PRESENTING ILLNESS:  Miranda Howe 44 y.o. female is here because of recently diagnosed GIST.   She had scatter intermittent low mid and RLQ abdominal pain and nausea and vomiting since early this year and some weight loss 15 lbs (some intensional), she was initially developed with her gynecologist and workup was negative except a cervical polyps. She presented to [facility] ED on March15 2016 with sudden onset of severe  abdominal pain, shaking chills. CT scan of the abdomen showed a large intra-abdominal mass, she was treated with IV hydration and antibiotics for septic shock. She underwent urgent exploratory laparotomy and resection of the small bowel mass on 06/07/2014 by Dr. Excell Seltzer. According to the operation note, the mass is located in the proximal jejunum, and there was an obvious opening on the and 10 mesenteric site of jejunum into the mass, possibly a diverticulum or perforating into the mass. There is no free fluid in the abdomen. She tolerated the procedure well and no was subsequently discharged home on 06/15/2014.  She has been recovering well from the surgery, off pain meds for 5-6 days, more physically active now, she has good appetite and eating well, BM is normal, no blood in stool.  she is able to do some light housework and activities, but has not been back to her for activities. She is planning to return to work on 08/03/2014. 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    MEDICAL HISTORY:  Miranda Howe returns for follow-up. She is compliant with her Gleevec,  And she is tolerating it very well. No significant nausea, bloating, or other noticeable side effects. She has a good appetite and eating well. She does have intermittent mild acid reflux,  No other complaints.  She is back to work full-time.She  Is not taking iron pill anymore, but is taking multivitamin  Once a day. Her weight is stable.   SURGICAL HISTORY: Past Surgical History  Procedure Laterality Date  . Wisdom tooth extraction    . Cervical polypectomy  2010  . Bartholin gland cyst excision  01/17/2009    I&D  . Laparotomy  N/A 06/07/2014    Procedure: EXPLORATORY LAPAROTOMY RESECTION OF ABDOMINAL MASS AND PORTION OF SMALL BOWEL;  Surgeon: Excell Seltzer, MD;  Location: [facility] ORS;  Service: General;  Laterality: N/A;    SOCIAL HISTORY: History   Social History  . Marital Status: Married    Spouse Name: N/A  . Number of  Children: 2 children, age of 40 and 70   . Years of Education: N/A   Occupational History  . Not on file.   Social History Main Topics  . Smoking status: Never Smoker   . Smokeless tobacco: Never Used  . Alcohol Use: Yes     Comment: socially  . Drug Use: No  . Sexual Activity: Yes    Birth Control/ Protection: Condom, Other-see comments     Comment: pt use gel    Other Topics Concern  . 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 Heart disease Father   . Hypertension Father   . Cancer Father 42    colon cancer; currently 102  . Other Mother     varicose veins  . Cancer Mother 3    breast cancer; currently 29  . Hypertension Mother   . Diabetes Mother   . Lupus Cousin   . Cancer Paternal Grandfather     colon cancer (age?); deceased  . Cancer Maternal Aunt     uterine cancer ~67; deceased 82s  . Cancer Paternal Uncle     pancreatic; deceased 79s  . Cancer Paternal Uncle     colon cancer 16s; currently 48  . Cancer Paternal Uncle     colon cancer 36s; currently 47s    ALLERGIES:  is allergic to sulfa antibiotics.  MEDICATIONS:  Current Outpatient Prescriptions  Medication Sig Dispense Refill  . acetaminophen (TYLENOL) 325 MG tablet Take 2 tablets (650 mg total) by mouth every 6 (six) hours as needed for mild pain, moderate pain, fever or headache.    . citalopram (CELEXA) 10 MG tablet Take 10 mg by mouth daily.    Marland Kitchen ibuprofen (ADVIL,MOTRIN) 200 MG tablet Take 400 mg by mouth every 6 (six) hours as needed for moderate pain.    Marland Kitchen imatinib (GLEEVEC) 400 MG tablet Take 1 tablet (400 mg total) by mouth daily. Take with meals and large glass of water.Caution:Chemotherapy. 90 tablet 2  . Multiple Vitamin (MULTIVITAMIN) tablet Take 1 tablet by mouth daily.    Marland Kitchen oxyCODONE (OXY IR/ROXICODONE) 5 MG immediate release tablet Take  1-2 tablets (5-10 mg total) by mouth every 4 (four) hours as needed for moderate pain. 50 tablet 0   No current facility-administered medications for this visit.    REVIEW OF SYSTEMS:   Constitutional: Denies fevers, chills or abnormal night sweats.  Eyes: Denies blurriness of vision, double vision or watery eyes Ears, nose, mouth, throat, and face: Denies mucositis or sore throat Respiratory: Denies cough, dyspnea or wheezes Cardiovascular: Denies palpitation, chest discomfort or lower extremity swelling Gastrointestinal:  Denies nausea, heartburn or change in bowel habits Skin: Denies abnormal skin rashes Lymphatics: Denies new lymphadenopathy or easy bruising Neurological:Denies numbness, tingling or new weaknesses Behavioral/Psych: Mood is stable, no new changes  All other systems were reviewed with the patient and are negative.  PHYSICAL EXAMINATION: ECOG PERFORMANCE STATUS: 0  Filed Vitals:   11/21/14 1405  BP: 111/61  Pulse: 61  Temp: 98.5 F (36.9 C)  Resp: 18   Filed Weights   11/21/14 1405  Weight: 164 lb (74.39 kg)    GENERAL:alert, no distress and comfortable SKIN: skin color, texture, turgor are normal, no rashes or significant lesions EYES: normal, conjunctiva are pink and non-injected, sclera clear OROPHARYNX:no exudate, no erythema and lips, buccal mucosa, and tongue normal  NECK: supple, thyroid normal size, non-tender, without nodularity LYMPH:  no palpable lymphadenopathy in the cervical, axillary or inguinal LUNGS: clear to auscultation and percussion with normal breathing effort HEART: regular rate & rhythm and no murmurs and no lower extremity edema ABDOMEN:abdomen soft, non-tender and normal bowel sounds. Midline surgical scar is well-healed.  Musculoskeletal:no cyanosis of digits and no clubbing  PSYCH: alert & oriented x 3 with fluent speech NEURO: no focal motor/sensory deficits  LABORATORY DATA:  I have reviewed the data as listed Lab  Results  Component Value Date   WBC 6.7 11/21/2014   HGB 11.2* 11/21/2014   HCT 33.4* 11/21/2014   MCV 88.4 11/21/2014   PLT 228 11/21/2014    Recent Labs  06/07/14 0046  06/10/14 0739 06/11/14 0336 06/12/14 0541 07/11/14 1333 08/15/14 1322 11/21/14 1326  NA  --   < > 142 140 139 139 139 139  K  --   < > 3.5 3.4* 3.9 4.1 4.3 3.8  CL  --   < > 109 106 105  --   --   --   CO2  --   < > _0 20* 23 22  GLUCOSE  --   < > 78 68* 98 84 108 123  BUN  --   < > 28* 19 21 13.6 13.3 18.1  CREATININE  --   < > 0.89 0.74 0.79 0.7 0.9 0.9  CALCIUM  --   < > 7.5* 7.9* 8.0* 9.6 9.1 9.2  GFRNONAA  --   < > 78* >90 >90  --   --   --   GFRAA  --   < > >90 >90 >90  --   --   --   PROT 7.8  < >  --  5.3*  --  7.2 6.9 6.7  ALBUMIN 3.9  < >  --  2.3*  --  4.0 4.0 4.0  AST 44*  < >  --  23  --  _1 ALT 31  < >  --  23  --  _2 ALKPHOS 207*  < >  --  149*  --  94 95 79  BILITOT 0.6  < >  --  1.2  --  0.34 0.41 0.42  BILIDIR 0.2  --   --   --   --   --   --   --   IBILI 0.4  --   --   --   --   --   --   --   < > = values in this interval not displayed.  PATHOLOGY REPORT Diagnosis 06/07/2014 Small intestine, resection for tumor, Abdominal mass - GASTROINTESTINAL STROMAL TUMOR, 10.2 CM WITH CENTRAL NECROTIC CAVITY. - SEE ONCOLOGY TABLE. Microscopic Comment GASTROINTESTINAL STROMAL TUMOR (GIST): Procedure: Resection. Tumor Site: Small bowel. Tumor Size: 10.2 cm. Tumor Focality: Unifocal. GIST Subtype: Spindle cell. Mitotic Rate: 2/50 HIGH POWER FIELD Necrosis: Present, central. Histologic Grade: G1: Low grade; mitotic rate 5/50 HIGH POWER FIELD. Risk Assessment: High risk. Margins: Free of tumor. Distance of tumor from  closest margin: Less than 0.2 cm. Ancillary testing: Immunohistochemistry. Lymph nodes: number examined: 0; number positive: N/A Pathologic Staging: pT4, pNX Comments: The tumor is composed of spindle cells with minimal to mild cytologic atypia and rare  mitotic figures (2 per 50 high power field). Much of the central portion of the tumor is cavitary with associated necrosis and the overlying small bowel mucosa is ulcerated and communicates with the central cavity within the tumor. The tumor extends to the subserosal connective tissue and is focally adjacent to the visceral peritoneum. Immunohistochemistry shows strong positivity with CD117 and smooth muscle actin and negative staining with S100, desmin and muscle specific actin. (JDP:ecj 06/09/2014)    RADIOGRAPHIC STUDIES: I have personally reviewed the radiological images as listed and agreed with the findings in the report.  Ct Angio chest, Abd/pel W/ And/or W/o 06/07/2014   IMPRESSION: 1. No evidence of aortic dissection. No evidence of aneurysmal dilatation. No calcific atherosclerotic disease seen. 2. Large complex 9.3 x 8.4 x 7.5 cm mass noted at the left lower quadrant. It contains a large amount of fluid and air, with a diffusely irregular peripheral soft tissue or. Mild surrounding soft tissue inflammation seen. This is suggested to be contiguous with the adjacent mid ileum. 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 Jejunal adenocarcinoma can mimic this appearance, and metastatic disease as might be seen with melanoma can also have such an appearance, though there are no additional findings to suggest melanoma. 3. There is recruitment of vasculature about the mass, mild nodularity overlying the adjacent descending colon, and a few prominent associated mesenteric nodes. These findings are highly suggestive of malignancy. 4. 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 However, the cecum and appendix are still seen on the right side. 5. Minimal nodularity at the lung apices likely reflects atelectasis, though minimal infection might have a similar  appearance. Minimal bibasilar atelectasis noted. 6. Suggestion of mild distal esophageal wall thickening, which could reflect minimal esophagitis, or could remain within normal limits. 7. Circumaortic left renal vein incidentally noted. 8. Somewhat heterogeneous appearance to hepatic enhancement. This may reflect underlying fatty infiltration or mild venous congestion. No focal mass seen. 9. Small right renal cyst noted. 10. Trace free fluid within the pelvis may be physiologic in nature, or could be related to the left lower quadrant abdominal mass. 11. Heterogeneous appearance to the uterus may reflect multiple tiny fibroids.  These results were called by telephone at the time of interpretation on 06/07/2014 at 2:58 am to Dr. Veatrice Kells, who verbally acknowledged these results.   Electronically Signed   By: Garald Balding M.D.   On: 06/07/2014 03:55    ASSESSMENT & PLAN:  44 year old Caucasian female without significant past medical history except anxiety who was found to have a 10 cm proximal jejunal mass, with perforation.   1. 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 -Giving the size of her tumor, this is stage III. Although it's low grade, giving the large size and perforation, this is consider a high risk tumor, the risk of recurrence is about 50%.   -The nature of the disease was reviewed with patient and her husband in great details, patient had many questions and I  answered to her satisfaction. -I recommend adjuvant Gleevec for 3 years to reduce the risk of recurrence in the future. -She tolerating Gleevec well, without significant side effects. We'll continue -continue surveillance. Per [facility]CC[facility], patient will be seeing an exam every 3-6 months for 5 years, then annually. CT scan will be obtained every 3-6 months for 5 years,  then annually. -repeat CT scan in 2 month   2. Anxiety -Continue Celexa.  Plan -Return to clinic in 2 months with lab and CT scan   All questions were answered. The patient knows to call the clinic with any problems, questions or concerns. I spent 20 minutes counseling the patient face to face. The total time spent in the appointment was 25 minutes and more than 50% was on counseling.     Truitt Merle, MD 11/21/2014 2:40 PM

## 2014-11-21 NOTE — Telephone Encounter (Signed)
per pof to sch pt appt-gave pt coy of avs-gave pt contrast-adv tp [facility] would call to sch scan

## 2014-12-01 ENCOUNTER — Encounter: Payer: Self-pay | Admitting: Genetic Counselor

## 2015-01-19 ENCOUNTER — Encounter (HOSPITAL_COMMUNITY): Payer: Self-pay

## 2015-01-19 ENCOUNTER — Ambulatory Visit (HOSPITAL_COMMUNITY)
Admission: RE | Admit: 2015-01-19 | Discharge: 2015-01-19 | Disposition: A | Discharge status: Home / Self Care | Payer: 59 | Source: Ambulatory Visit | Attending: Hematology | Admitting: Hematology

## 2015-01-19 DIAGNOSIS — Z09 Encounter for follow-up examination after completed treatment for conditions other than malignant neoplasm: Secondary | ICD-10-CM | POA: Diagnosis present

## 2015-01-19 DIAGNOSIS — C49A3 Gastrointestinal stromal tumor of small intestine: Secondary | ICD-10-CM | POA: Diagnosis present

## 2015-01-19 MED ORDER — IOHEXOL 300 MG/ML  SOLN
100.0000 mL | Freq: Once | INTRAMUSCULAR | Status: AC | PRN
  Administered 2015-01-19: 100 mL via INTRAVENOUS

## 2015-01-22 ENCOUNTER — Telehealth: Payer: Self-pay | Admitting: Hematology

## 2015-01-22 ENCOUNTER — Other Ambulatory Visit (HOSPITAL_BASED_OUTPATIENT_CLINIC_OR_DEPARTMENT_OTHER): Payer: 59

## 2015-01-22 ENCOUNTER — Ambulatory Visit (HOSPITAL_BASED_OUTPATIENT_CLINIC_OR_DEPARTMENT_OTHER): Payer: 59 | Admitting: Hematology

## 2015-01-22 ENCOUNTER — Encounter: Payer: Self-pay | Admitting: Hematology

## 2015-01-22 VITALS — BP 110/76 | HR 63 | Temp 98.6°F | Resp 18 | Ht 63.0 in | Wt 165.4 lb

## 2015-01-22 DIAGNOSIS — D509 Iron deficiency anemia, unspecified: Secondary | ICD-10-CM

## 2015-01-22 DIAGNOSIS — C49A3 Gastrointestinal stromal tumor of small intestine: Secondary | ICD-10-CM | POA: Diagnosis not present

## 2015-01-22 DIAGNOSIS — F419 Anxiety disorder, unspecified: Secondary | ICD-10-CM

## 2015-01-22 LAB — CBC & DIFF AND RETIC
BASO%: 0.4 % (ref 0.0–2.0)
BASOS ABS: 0 10*3/uL (ref 0.0–0.1)
EOS%: 4 % (ref 0.0–7.0)
Eosinophils Absolute: 0.3 10*3/uL (ref 0.0–0.5)
HEMATOCRIT: 37.1 % (ref 34.8–46.6)
HEMOGLOBIN: 12.3 g/dL (ref 11.6–15.9)
Immature Retic Fract: 5.3 % (ref 1.60–10.00)
LYMPH%: 33.3 % (ref 14.0–49.7)
MCH: 30.4 pg (ref 25.1–34.0)
MCHC: 33.2 g/dL (ref 31.5–36.0)
MCV: 91.6 fL (ref 79.5–101.0)
MONO#: 0.5 10*3/uL (ref 0.1–0.9)
MONO%: 6.4 % (ref 0.0–14.0)
NEUT#: 4.8 10*3/uL (ref 1.5–6.5)
NEUT%: 55.9 % (ref 38.4–76.8)
PLATELETS: 231 10*3/uL (ref 145–400)
RBC: 4.05 10*6/uL (ref 3.70–5.45)
RDW: 13.4 % (ref 11.2–14.5)
Retic %: 1.51 % (ref 0.70–2.10)
Retic Ct Abs: 61.16 10*3/uL (ref 33.70–90.70)
WBC: 8.5 10*3/uL (ref 3.9–10.3)
lymph#: 2.8 10*3/uL (ref 0.9–3.3)

## 2015-01-22 LAB — COMPREHENSIVE METABOLIC PANEL (CC13)
ALBUMIN: 3.9 g/dL (ref 3.5–5.0)
ALK PHOS: 75 U/L (ref 40–150)
ALT: 21 U/L (ref 0–55)
ANION GAP: 5 meq/L (ref 3–11)
AST: 20 U/L (ref 5–34)
BUN: 16.7 mg/dL (ref 7.0–26.0)
CO2: 25 mEq/L (ref 22–29)
Calcium: 9.7 mg/dL (ref 8.4–10.4)
Chloride: 110 mEq/L — ABNORMAL HIGH (ref 98–109)
Creatinine: 0.9 mg/dL (ref 0.6–1.1)
EGFR: 76 mL/min/{1.73_m2} — AB (ref >90)
Glucose: 90 mg/dl (ref 70–140)
Potassium: 4.7 mEq/L (ref 3.5–5.1)
Sodium: 140 mEq/L (ref 136–145)
Total Bilirubin: <0.3 mg/dL (ref 0.20–1.20)
Total Protein: 6.8 g/dL (ref 6.4–8.3)

## 2015-01-22 NOTE — Progress Notes (Signed)
[place]  [phone] Fax:([phone]  Clinic Follow up Note   Patient Care Team: Pcp Not In System as PCP - General Earnstine Regal, PA-C as Physician Assistant (Obstetrics and Gynecology) Excell Seltzer, MD as Consulting Physician (General Surgery) Truitt Merle, MD as Consulting Physician ([place]) Tania Ade, RN as Registered Nurse (Medical Oncology) 01/22/2015  CHIEF COMPLAINTS:  Follow up GIST   Oncology History   GIST (gastrointestinal stromal tumor) of jejunum with perforation, s/p SB resection 06/07/2014   Staging form: Small Intestine, AJCC 7th Edition     Pathologic stage from 07/03/2014: Stage Unknown (T4, NX, cM0) - Unsigned       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 Recruitment of vasculature about the mass, mild nodularity overlying the adjacent descending colon.   06/07/2014 Pathologic Stage pT4,pNX / GI:low grade; mitotic rate 5/50   06/07/2014 Surgery Exploratory laparotomy resection of abdominal mass and portion of small bowel   06/07/2014 Miscellaneous tumor was tested for c-kit gene mutation, (+) p.I097 deletion/insertion NP. This predicts good response to Tyrosine kinase inhibitor.   07/18/2014 -  Chemotherapy Gleevec 400 mg po    HISTORY OF PRESENTING ILLNESS (08/15/2014):  Miranda Howe 44 y.o. female is here because of recently diagnosed GIST.   She had scatter intermittent low mid and RLQ abdominal pain and nausea and vomiting since early this year and some weight loss 15 lbs (some intensional), she was initially developed with her gynecologist and workup was negative except a cervical polyps. She presented to [facility] ED on March15 2016 with sudden onset of severe  abdominal pain, shaking chills. CT scan of the abdomen showed a large intra-abdominal mass, she was treated with IV hydration and antibiotics for septic shock. She underwent urgent exploratory laparotomy and resection of the small bowel mass on 06/07/2014 by Dr. Excell Seltzer. According to the operation note, the mass is located in the proximal jejunum, and there was an obvious opening on the and 10 mesenteric site of jejunum into the mass, possibly a diverticulum or perforating into the mass. There is no free fluid in the abdomen. She tolerated the procedure well and no was subsequently discharged home on 06/15/2014.  She has been recovering well from the surgery, off pain meds for 5-6 days, more physically active now, she has good appetite and eating well, BM is normal, no blood in stool.  she is able to do some light housework and activities, but has not been back to her for activities. She is planning to return to work on 08/03/2014. 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    MEDICAL HISTORY:  Miranda Howe returns for follow-up. She is doing well overall. She still has mild fatigue, but able to tolerate full time job in routine daily activity. She denies any pain, nausea, bloating, change of her bowel habits, melena or hematochezia. She has good appetite and eating well. Her weight is stable.  SURGICAL HISTORY: Past Surgical History  Procedure Laterality Date  . Wisdom tooth extraction    . Cervical polypectomy  2010  . Bartholin gland cyst excision  01/17/2009    I&D  . Laparotomy N/A 06/07/2014    Procedure: EXPLORATORY LAPAROTOMY RESECTION OF ABDOMINAL MASS AND PORTION OF SMALL BOWEL;  Surgeon: Excell Seltzer, MD;  Location: [facility] OR[facility];  Service: General;  Laterality: N/A;    SOCIAL HISTORY: History   Social History  . Marital Status: Married    Spouse Name: N/A  . Number of Children: 2 children, age of 70 and 39   . Years of Education: N/A   Occupational History  . Not on file.    Social History Main Topics  . Smoking status: Never Smoker   . Smokeless tobacco: Never Used  . Alcohol Use: Yes     Comment: socially  . Drug Use: No  . Sexual Activity: Yes    Birth Control/ Protection: Condom, Other-see comments     Comment: pt use gel    Other Topics Concern  . 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 Heart disease Father   . Hypertension Father   . Cancer Father 76    colon cancer; currently 44  . Other Mother     varicose veins  . Cancer Mother 77    breast cancer; currently 68  . Hypertension Mother   . Diabetes Mother   . Lupus Cousin   . Cancer Paternal Grandfather     colon cancer (age?); deceased  . Cancer Maternal Aunt     uterine cancer ~67; deceased 3s  . Cancer Paternal Uncle     pancreatic; deceased 50s  . Cancer Paternal Uncle     colon cancer 88s; currently 41  . Cancer Paternal Uncle     colon cancer 68s; currently 46s    ALLERGIES:  is allergic to sulfa antibiotics.  MEDICATIONS:  Current Outpatient Prescriptions  Medication Sig Dispense Refill  . acetaminophen (TYLENOL) 325 MG tablet Take 2 tablets (650 mg total) by mouth every 6 (six) hours as needed for mild pain, moderate pain, fever or headache.    . citalopram (CELEXA) 10 MG tablet Take 10 mg by mouth daily.    Marland Kitchen ibuprofen (ADVIL,MOTRIN) 200 MG tablet Take 400 mg by mouth every 6 (six) hours as needed for moderate pain.    Marland Kitchen imatinib (GLEEVEC) 400 MG tablet Take 1 tablet (400 mg total) by mouth daily. Take with meals and large glass of water.Caution:Chemotherapy. 90 tablet 2  . Multiple Vitamin (MULTIVITAMIN) tablet Take 1 tablet by mouth daily.    Marland Kitchen oxyCODONE (OXY IR/ROXICODONE) 5 MG immediate release tablet Take 1-2 tablets (5-10 mg total) by mouth every 4 (four) hours as needed for moderate pain. 50 tablet 0   No  current facility-administered medications for this visit.    REVIEW OF SYSTEMS:   Constitutional: Denies fevers, chills or abnormal night sweats.  Eyes: Denies blurriness of vision, double vision or watery eyes Ears, nose, mouth, throat, and face: Denies mucositis or sore throat Respiratory: Denies cough, dyspnea or wheezes Cardiovascular: Denies palpitation, chest discomfort or lower extremity swelling Gastrointestinal:  Denies nausea, heartburn or change in bowel habits Skin: Denies abnormal skin rashes Lymphatics: Denies new lymphadenopathy or easy bruising Neurological:Denies numbness, tingling or new weaknesses Behavioral/Psych: Mood is stable, no new changes  All other systems were reviewed with the patient and are negative.  PHYSICAL EXAMINATION: ECOG PERFORMANCE STATUS: 0  Filed Vitals:   01/22/15 1509  BP: 110/76  Pulse: 63  Temp: 98.6 F (37 C)  Resp: 18   Filed Weights   01/22/15 1509  Weight: 165  lb 6.4 oz (75.025 kg)    GENERAL:alert, no distress and comfortable SKIN: skin color, texture, turgor are normal, no rashes or significant lesions EYES: normal, conjunctiva are pink and non-injected, sclera clear OROPHARYNX:no exudate, no erythema and lips, buccal mucosa, and tongue normal  NECK: supple, thyroid normal size, non-tender, without nodularity LYMPH:  no palpable lymphadenopathy in the cervical, axillary or inguinal LUNGS: clear to auscultation and percussion with normal breathing effort HEART: regular rate & rhythm and no murmurs and no lower extremity edema ABDOMEN:abdomen soft, non-tender and normal bowel sounds. Midline surgical scar is well-healed.  Musculoskeletal:no cyanosis of digits and no clubbing  PSYCH: alert & oriented x 3 with fluent speech NEURO: no focal motor/sensory deficits  LABORATORY DATA:  I have reviewed the data as listed CBC Latest Ref Rng 01/22/2015 11/21/2014 08/15/2014  WBC 3.9 - 10.3 10e3/uL 8.5 6.7 5.7  Hemoglobin 11.6 -  15.9 g/dL 12.3 11.2(L) 11.0(L)  Hematocrit 34.8 - 46.6 % 37.1 33.4(L) 34.9  Platelets 145 - 400 10e3/uL 231 228 263      Recent Labs  06/07/14 0046  06/10/14 0739 06/11/14 0336 06/12/14 0541  08/15/14 1322 11/21/14 1326 01/22/15 1444  NA  --   < > 142 140 139  < > 139 139 140  K  --   < > 3.5 3.4* 3.9  < > 4.3 3.8 4.7  CL  --   < > 109 106 105  --   --   --   --   CO2  --   < > $R'24 23 21  'DA$ < > $R'23 22 25  'de$ GLUCOSE  --   < > 78 68* 98  < > 108 123 90  BUN  --   < > 28* 19 21  < > 13.3 18.1 16.7  CREATININE  --   < > 0.89 0.74 0.79  < > 0.9 0.9 0.9  CALCIUM  --   < > 7.5* 7.9* 8.0*  < > 9.1 9.2 9.7  GFRNONAA  --   < > 78* >90 >90  --   --   --   --   GFRAA  --   < > >90 >90 >90  --   --   --   --   PROT 7.8  < >  --  5.3*  --   < > 6.9 6.7 6.8  ALBUMIN 3.9  < >  --  2.3*  --   < > 4.0 4.0 3.9  AST 44*  < >  --  23  --   < > $R'21 21 20  'eX$ ALT 31  < >  --  23  --   < > $R'17 21 21  'qp$ ALKPHOS 207*  < >  --  149*  --   < > 95 79 75  BILITOT 0.6  < >  --  1.2  --   < > 0.41 0.42 <0.30  BILIDIR 0.2  --   --   --   --   --   --   --   --   IBILI 0.4  --   --   --   --   --   --   --   --   < > = values in this interval not displayed.  PATHOLOGY REPORT Diagnosis 06/07/2014 Small intestine, resection for tumor, Abdominal mass - GASTROINTESTINAL STROMAL TUMOR, 10.2 CM WITH CENTRAL NECROTIC CAVITY. - SEE ONCOLOGY TABLE. Microscopic Comment GASTROINTESTINAL STROMAL TUMOR (GIST):  Procedure: Resection. Tumor Site: Small bowel. Tumor Size: 10.2 cm. Tumor Focality: Unifocal. GIST Subtype: Spindle cell. Mitotic Rate: 2/50 HIGH POWER FIELD Necrosis: Present, central. Histologic Grade: G1: Low grade; mitotic rate 5/50 HIGH POWER FIELD. Risk Assessment: High risk. Margins: Free of tumor. Distance of tumor from closest margin: Less than 0.2 cm. Ancillary testing: Immunohistochemistry. Lymph nodes: number examined: 0; number positive: N/A Pathologic Staging: pT4, pNX Comments: The tumor is composed  of spindle cells with minimal to mild cytologic atypia and rare mitotic figures (2 per 50 high power field). Much of the central portion of the tumor is cavitary with associated necrosis and the overlying small bowel mucosa is ulcerated and communicates with the central cavity within the tumor. The tumor extends to the subserosal connective tissue and is focally adjacent to the visceral peritoneum. Immunohistochemistry shows strong positivity with CD117 and smooth muscle actin and negative staining with S100, desmin and muscle specific actin. (JDP:ecj 06/09/2014)    RADIOGRAPHIC STUDIES: I have personally reviewed the radiological images as listed and agreed with the findings in the report.   CT abdomen and pelvis w contrast 01/19/2015 IMPRESSION: Negative. No evidence of recurrent or metastatic neoplasm, or other acute findings.   ASSESSMENT & PLAN:  44 year old Caucasian female without significant past medical history except anxiety who was found to have a 10 cm proximal jejunal mass, with perforation.   1. 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 -Giving the size of her tumor, this is stage III. Although it's low grade, giving the large size and perforation, this is consider a high risk tumor, the risk of recurrence is about 50%.   -The nature of the disease was reviewed with patient and her husband in great details, patient had many questions and I answered to her satisfaction. -I recommend adjuvant Gleevec for 3 years to reduce the risk of recurrence in the future. -I reviewed her recent restaging CT scan, which showed no evidence of recurrence. -She tolerating Gleevec well, without significant side effects. We'll continue -continue surveillance. Per [facility], patient will be seeing an exam every 3-6 months for 5 years,  then annually. CT scan will be obtained every 3-6 months for 5 years, then annually. -I plan to repeat CT scan in 6 months  2. Anxiety -Continue Celexa.  3. Anemia, secondary to surgery and mild iron deficient anemia -She has been taking oral iron, tolerating well. -Her anemia has improved, hemoglobin 12.3 today. -continue oral iron    Plan -Return to clinic in 3 months with lab   All questions were answered. The patient knows to call the clinic with any problems, questions or concerns. I spent 20 minutes counseling the patient face to face. The total time spent in the appointment was 25 minutes and more than 50% was on counseling.     Truitt Merle, MD 01/22/2015 8:17 AM

## 2015-01-22 NOTE — Telephone Encounter (Signed)
per pof to sch pt appt-gave pt copy of avs °

## 2015-04-16 MED FILL — GLEEVEC 400 MG TABLET: 400 | 30 days supply | Qty: 30 | Fill #4

## 2015-04-23 ENCOUNTER — Encounter: Payer: Self-pay | Admitting: Hematology

## 2015-04-23 ENCOUNTER — Encounter: Payer: Self-pay | Admitting: *Deleted

## 2015-04-23 ENCOUNTER — Telehealth: Payer: Self-pay | Admitting: Hematology

## 2015-04-23 ENCOUNTER — Ambulatory Visit (HOSPITAL_BASED_OUTPATIENT_CLINIC_OR_DEPARTMENT_OTHER): Payer: 59 | Admitting: Hematology

## 2015-04-23 ENCOUNTER — Other Ambulatory Visit (HOSPITAL_BASED_OUTPATIENT_CLINIC_OR_DEPARTMENT_OTHER): Payer: 59

## 2015-04-23 VITALS — BP 110/70 | HR 57 | Temp 98.3°F | Resp 17 | Ht 63.0 in | Wt 170.4 lb

## 2015-04-23 DIAGNOSIS — F419 Anxiety disorder, unspecified: Secondary | ICD-10-CM | POA: Diagnosis not present

## 2015-04-23 DIAGNOSIS — C49A3 Gastrointestinal stromal tumor of small intestine: Secondary | ICD-10-CM

## 2015-04-23 DIAGNOSIS — D509 Iron deficiency anemia, unspecified: Secondary | ICD-10-CM | POA: Diagnosis not present

## 2015-04-23 LAB — CBC & DIFF AND RETIC
BASO%: 0.2 % (ref 0.0–2.0)
Basophils Absolute: 0 10*3/uL (ref 0.0–0.1)
EOS%: 2.9 % (ref 0.0–7.0)
Eosinophils Absolute: 0.2 10*3/uL (ref 0.0–0.5)
HEMATOCRIT: 35.2 % (ref 34.8–46.6)
HEMOGLOBIN: 12 g/dL (ref 11.6–15.9)
Immature Retic Fract: 2.8 % (ref 1.60–10.00)
LYMPH%: 31.9 % (ref 14.0–49.7)
MCH: 31.2 pg (ref 25.1–34.0)
MCHC: 34.1 g/dL (ref 31.5–36.0)
MCV: 91.4 fL (ref 79.5–101.0)
MONO#: 0.6 10*3/uL (ref 0.1–0.9)
MONO%: 6.6 % (ref 0.0–14.0)
NEUT%: 58.4 % (ref 38.4–76.8)
NEUTROS ABS: 4.9 10*3/uL (ref 1.5–6.5)
Platelets: 262 10*3/uL (ref 145–400)
RBC: 3.85 10*6/uL (ref 3.70–5.45)
RDW: 13.8 % (ref 11.2–14.5)
RETIC %: 1.28 % (ref 0.70–2.10)
Retic Ct Abs: 49.28 10*3/uL (ref 33.70–90.70)
WBC: 8.3 10*3/uL (ref 3.9–10.3)
lymph#: 2.7 10*3/uL (ref 0.9–3.3)

## 2015-04-23 LAB — COMPREHENSIVE METABOLIC PANEL
ALBUMIN: 3.9 g/dL (ref 3.5–5.0)
ALK PHOS: 82 U/L (ref 40–150)
ALT: 19 U/L (ref 0–55)
AST: 19 U/L (ref 5–34)
Anion Gap: 8 mEq/L (ref 3–11)
BILIRUBIN TOTAL: 0.38 mg/dL (ref 0.20–1.20)
BUN: 16.3 mg/dL (ref 7.0–26.0)
CO2: 24 mEq/L (ref 22–29)
Calcium: 9.1 mg/dL (ref 8.4–10.4)
Chloride: 108 mEq/L (ref 98–109)
Creatinine: 1.1 mg/dL (ref 0.6–1.1)
EGFR: 62 mL/min/{1.73_m2} — ABNORMAL LOW (ref >90)
Glucose: 87 mg/dl (ref 70–140)
Potassium: 4.2 mEq/L (ref 3.5–5.1)
SODIUM: 140 meq/L (ref 136–145)
TOTAL PROTEIN: 6.9 g/dL (ref 6.4–8.3)

## 2015-04-23 NOTE — Progress Notes (Signed)
Per pharmacist, there is no data on [place] and [place]. Thinks will most likely be OK, just separate the two by several hours and not consume together. Left this message on her mobile voice mail.

## 2015-04-23 NOTE — Telephone Encounter (Signed)
Gave patient avs report and appointment for April and May.  Central radiology schedulers will call re ct - patient aware.

## 2015-04-23 NOTE — Progress Notes (Signed)
Oncology Nurse Navigator Documentation  Oncology Nurse Navigator Flowsheets 04/23/2015  Navigator Location [facility]  Navigator Encounter Type Follow-up Appt  Patient Visit Type MedOnc;Follow-up  Treatment Phase Treatment w/ Gleevec 400 mg /day  Barriers/Navigation Needs Education--protein drink w/spiralina ingredient  Interventions Other--requested oral chemo pharmacist research to confirm there is no potential interaction w/Gleevec  Acuity Level 1  Time Spent with Patient 15  Time Spent with Patient (Retired) -  Will notify patient once pharmacist researches this for her.

## 2015-04-23 NOTE — Progress Notes (Signed)
Miranda Howe  [phone]) [phone] Fax:([phone]) [phone]  Clinic Follow up Note   Patient Care Team: Pcp Not In System as PCP - General Earnstine Regal, PA-C as Physician Assistant (Obstetrics and Gynecology) Excell Seltzer, MD as Consulting Physician (General Surgery) Truitt Merle, MD as Consulting Physician (Medical Oncology) Tania Ade, RN as Registered Nurse (Medical Oncology) 04/23/2015  CHIEF COMPLAINTS:  Follow up GIST   Oncology History   GIST (gastrointestinal stromal tumor) of jejunum with perforation, s/p SB resection 06/07/2014   Staging form: Small Intestine, AJCC 7th Edition     Pathologic stage from 07/03/2014: Stage Unknown (T4, NX, cM0) - Unsigned       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 Recruitment of vasculature about the mass, mild nodularity overlying the adjacent descending colon.   06/07/2014 Pathologic Stage pT4,pNX / GI:low grade; mitotic rate 5/50   06/07/2014 Surgery Exploratory laparotomy resection of abdominal mass and portion of small bowel   06/07/2014 Miscellaneous tumor was tested for c-kit gene mutation, (+) p.Z601 deletion/insertion NP. This predicts good response to Tyrosine kinase inhibitor.   07/18/2014 -  Chemotherapy Gleevec 400 mg po    HISTORY OF PRESENTING ILLNESS (08/15/2014):  Miranda Howe 45 y.o. female is here because of recently diagnosed GIST.   She had scatter intermittent low mid and RLQ abdominal pain and nausea and vomiting since early this year and some weight loss 15 lbs (some intensional), she was initially developed with her gynecologist and workup was negative except a cervical polyps. She presented to [facility] ED on March15 2016 with sudden onset of severe  abdominal pain, shaking chills. CT scan of the abdomen showed a large intra-abdominal mass, she was treated with IV hydration and antibiotics for septic shock. She underwent urgent exploratory laparotomy and resection of the small bowel mass on 06/07/2014 by Dr. Excell Seltzer. According to the operation note, the mass is located in the proximal jejunum, and there was an obvious opening on the and 10 mesenteric site of jejunum into the mass, possibly a diverticulum or perforating into the mass. There is no free fluid in the abdomen. She tolerated the procedure well and no was subsequently discharged home on 06/15/2014.  She has been recovering well from the surgery, off pain meds for 5-6 days, more physically active now, she has good appetite and eating well, BM is normal, no blood in stool.  she is able to do some light housework and activities, but has not been back to her for activities. She is planning to return to work on 08/03/2014. 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   CURRENT THERAPY: Gleevec 431m dialy   INTERIM HISTORY:  [name] for follow-up. She is doing VERY well overall. Her fatigue has improved, she tolerates her full time job very well. She denies any pain, nausea, diarrhea or other symptoms. She has good appetite and weight is stable.   SURGICAL HISTORY: Past Surgical History  Procedure Laterality Date  . Wisdom tooth extraction    . Cervical polypectomy  2010  . Bartholin gland cyst excision  01/17/2009    I&D  . Laparotomy N/A 06/07/2014    Procedure: EXPLORATORY LAPAROTOMY RESECTION OF ABDOMINAL MASS AND PORTION OF SMALL BOWEL;  Surgeon: BExcell Seltzer MD;  Location: [facility] OR[facility];  Service: General;  Laterality: N/A;    SOCIAL HISTORY: History   Social History  . Marital Status: Married    Spouse Name: N/A  . Number of Children: 2 children, age of 39 and 55   . Years of Education: N/A   Occupational History  . Not on file.   Social History Main Topics    . Smoking status: Never Smoker   . Smokeless tobacco: Never Used  . Alcohol Use: Yes     Comment: socially  . Drug Use: No  . Sexual Activity: Yes    Birth Control/ Protection: Condom, Other-see comments     Comment: pt use gel    Other Topics Concern  . 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 Heart disease Father   . Hypertension Father   . Cancer Father 59    colon cancer; currently 23  . Other Mother     varicose veins  . Cancer Mother 84    breast cancer; currently 73  . Hypertension Mother   . Diabetes Mother   . Lupus Cousin   . Cancer Paternal Grandfather     colon cancer (age?); deceased  . Cancer Maternal Aunt     uterine cancer ~67; deceased 75s  . Cancer Paternal Uncle     pancreatic; deceased 15s  . Cancer Paternal Uncle     colon cancer 46s; currently 20  . Cancer Paternal Uncle     colon cancer 21s; currently 8s    ALLERGIES:  is allergic to sulfa antibiotics.  MEDICATIONS:  Current Outpatient Prescriptions  Medication Sig Dispense Refill  . acetaminophen (TYLENOL) 325 MG tablet Take 2 tablets (650 mg total) by mouth every 6 (six) hours as needed for mild pain, moderate pain, fever or headache.    . citalopram (CELEXA) 10 MG tablet Take 10 mg by mouth daily.    Marland Kitchen ibuprofen (ADVIL,MOTRIN) 200 MG tablet Take 400 mg by mouth every 6 (six) hours as needed for moderate pain.    Marland Kitchen imatinib (GLEEVEC) 400 MG tablet Take 1 tablet (400 mg total) by mouth daily. Take with meals and large glass of water.Caution:Chemotherapy. 90 tablet 2  . Multiple Vitamin (MULTIVITAMIN) tablet Take 1 tablet by mouth daily.    Marland Kitchen oxyCODONE (OXY IR/ROXICODONE) 5 MG immediate release tablet Take 1-2 tablets (5-10 mg total) by mouth every 4 (four) hours as needed for moderate pain. 50 tablet 0   No current facility-administered  medications for this visit.    REVIEW OF SYSTEMS:   Constitutional: Denies fevers, chills or abnormal night sweats.  Eyes: Denies blurriness of vision, double vision or watery eyes Ears, nose, mouth, throat, and face: Denies mucositis or sore throat Respiratory: Denies cough, dyspnea or wheezes Cardiovascular: Denies palpitation, chest discomfort or lower extremity swelling Gastrointestinal:  Denies nausea, heartburn or change in bowel habits Skin: Denies abnormal skin rashes Lymphatics: Denies new lymphadenopathy or easy bruising Neurological:Denies numbness, tingling or new weaknesses Behavioral/Psych: Mood is stable, no new changes  All other systems were reviewed with the patient and are negative.  PHYSICAL EXAMINATION: ECOG PERFORMANCE STATUS: 0  Filed Vitals:   04/23/15 1321  BP: 110/70  Pulse: 57  Temp: 98.3 F (36.8 C)  Resp: 17   Filed Weights   04/23/15 1321  Weight: 170 lb 6.4 oz (  77.293 kg)    GENERAL:alert, no distress and comfortable SKIN: skin color, texture, turgor are normal, no rashes or significant lesions EYES: normal, conjunctiva are pink and non-injected, sclera clear OROPHARYNX:no exudate, no erythema and lips, buccal mucosa, and tongue normal  NECK: supple, thyroid normal size, non-tender, without nodularity LYMPH:  no palpable lymphadenopathy in the cervical, axillary or inguinal LUNGS: clear to auscultation and percussion with normal breathing effort HEART: regular rate & rhythm and no murmurs and no lower extremity edema ABDOMEN:abdomen soft, non-tender and normal bowel sounds. Midline surgical scar is well-healed.  Musculoskeletal:no cyanosis of digits and no clubbing  PSYCH: alert & oriented x 3 with fluent speech NEURO: no focal motor/sensory deficits  LABORATORY DATA:  I have reviewed the data as listed CBC Latest Ref Rng 04/23/2015 01/22/2015 11/21/2014  WBC 3.9 - 10.3 10e3/uL 8.3 8.5 6.7  Hemoglobin 11.6 - 15.9 g/dL 12.0 12.3 11.2(L)    Hematocrit 34.8 - 46.6 % 35.2 37.1 33.4(L)  Platelets 145 - 400 10e3/uL 262 231 228      Recent Labs  06/07/14 0046  06/10/14 0739 06/11/14 0336 06/12/14 0541  11/21/14 1326 01/22/15 1444 04/23/15 1252  NA  --   < > 142 140 139  < > 139 140 140  K  --   < > 3.5 3.4* 3.9  < > 3.8 4.7 4.2  CL  --   < > 109 106 105  --   --   --   --   CO2  --   < > _0 < > _1 GLUCOSE  --   < > 78 68* 98  < > 123 90 87  BUN  --   < > 28* 19 21  < > 18.1 16.7 16.3  CREATININE  --   < > 0.89 0.74 0.79  < > 0.9 0.9 1.1  CALCIUM  --   < > 7.5* 7.9* 8.0*  < > 9.2 9.7 9.1  GFRNONAA  --   < > 78* >90 >90  --   --   --   --   GFRAA  --   < > >90 >90 >90  --   --   --   --   PROT 7.8  < >  --  5.3*  --   < > 6.7 6.8 6.9  ALBUMIN 3.9  < >  --  2.3*  --   < > 4.0 3.9 3.9  AST 44*  < >  --  23  --   < > _2 ALT 31  < >  --  23  --   < > _3 ALKPHOS 207*  < >  --  149*  --   < > 79 75 82  BILITOT 0.6  < >  --  1.2  --   < > 0.42 <0.30 0.38  BILIDIR 0.2  --   --   --   --   --   --   --   --   IBILI 0.4  --   --   --   --   --   --   --   --   < > = values in this interval not displayed.  PATHOLOGY REPORT Diagnosis 06/07/2014 Small intestine, resection for tumor, Abdominal mass - GASTROINTESTINAL STROMAL TUMOR, 10.2 CM WITH CENTRAL NECROTIC CAVITY. - SEE ONCOLOGY TABLE. Microscopic Comment GASTROINTESTINAL STROMAL TUMOR (GIST): Procedure: Resection.  Tumor Site: Small bowel. Tumor Size: 10.2 cm. Tumor Focality: Unifocal. GIST Subtype: Spindle cell. Mitotic Rate: 2/50 HIGH POWER FIELD Necrosis: Present, central. Histologic Grade: G1: Low grade; mitotic rate 5/50 HIGH POWER FIELD. Risk Assessment: High risk. Margins: Free of tumor. Distance of tumor from closest margin: Less than 0.2 cm. Ancillary testing: Immunohistochemistry. Lymph nodes: number examined: 0; number positive: N/A Pathologic Staging: pT4, pNX Comments: The tumor is composed of spindle cells with minimal to  mild cytologic atypia and rare mitotic figures (2 per 50 high power field). Much of the central portion of the tumor is cavitary with associated necrosis and the overlying small bowel mucosa is ulcerated and communicates with the central cavity within the tumor. The tumor extends to the subserosal connective tissue and is focally adjacent to the visceral peritoneum. Immunohistochemistry shows strong positivity with CD117 and smooth muscle actin and negative staining with S100, desmin and muscle specific actin. (JDP:ecj 06/09/2014)    RADIOGRAPHIC STUDIES: I have personally reviewed the radiological images as listed and agreed with the findings in the report.   CT abdomen and pelvis w contrast 01/19/2015 IMPRESSION: Negative. No evidence of recurrent or metastatic neoplasm, or other acute findings.   ASSESSMENT & PLAN:  45 year old Caucasian female without significant past medical history except anxiety who was found to have a 10 cm proximal jejunal mass, with perforation.   1. 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 -Giving the size of her tumor, this is stage III. Although it's low grade, giving the large size and perforation, this is consider a high risk tumor, the risk of recurrence is about 50%.   -The nature of the disease was reviewed with patient and her husband in great details, patient had many questions and I answered to her satisfaction. -I recommend adjuvant Gleevec for 3 years to reduce the risk of recurrence in the future. -Her first restaging CT scan in 102016 showed no evidence of recurrence. -She tolerating Gleevec well, without significant side effects. We'll continue -continue surveillance. Per [facility], patient will be seen and exam every 3-6 months for 5 years, then annually. CT scan will be  obtained every 3-6 months for 5 years, then annually. -I will repeat CT scan in 3 months -lab results were reviewed with her, her CBC and CMP are unremarkable except mild elevated Cr (1.1 from baseline 0.7). I encourage her to drink more water.   2. Anxiety -Continue Celexa.  3. Anemia, secondary to surgery and mild iron deficient anemia -She has been taking oral iron, tolerating well. -Her anemia has improved, hemoglobin 12.3 today. -continue oral iron    Plan -continue [place] -Return to clinic in 3 months with lab and CT. If her Cr increase further >1.2 then I will hold iv contrast   All questions were answered. The patient knows to call the clinic with any problems, questions or concerns.  I spent 20 minutes counseling the patient face to face. The total time spent in the appointment was 25 minutes and more than 50% was on counseling.     Truitt Merle, MD 04/23/2015 8:26 PM

## 2015-05-15 MED FILL — GLEEVEC 400 MG TABLET: 400 | 30 days supply | Qty: 30 | Fill #5

## 2015-06-21 MED FILL — GLEEVEC 400 MG TABLET: 400 | 30 days supply | Qty: 30 | Fill #6

## 2015-07-16 ENCOUNTER — Ambulatory Visit (HOSPITAL_COMMUNITY)
Admission: RE | Admit: 2015-07-16 | Discharge: 2015-07-16 | Disposition: A | Discharge status: Home / Self Care | Payer: 59 | Source: Ambulatory Visit | Attending: Hematology | Admitting: Hematology

## 2015-07-16 ENCOUNTER — Other Ambulatory Visit (HOSPITAL_BASED_OUTPATIENT_CLINIC_OR_DEPARTMENT_OTHER): Payer: 59

## 2015-07-16 ENCOUNTER — Ambulatory Visit (HOSPITAL_COMMUNITY): Payer: 59

## 2015-07-16 DIAGNOSIS — N83201 Unspecified ovarian cyst, right side: Secondary | ICD-10-CM | POA: Insufficient documentation

## 2015-07-16 DIAGNOSIS — C49A3 Gastrointestinal stromal tumor of small intestine: Secondary | ICD-10-CM | POA: Diagnosis not present

## 2015-07-16 DIAGNOSIS — R918 Other nonspecific abnormal finding of lung field: Secondary | ICD-10-CM | POA: Diagnosis not present

## 2015-07-16 LAB — CBC & DIFF AND RETIC
BASO%: 0.3 % (ref 0.0–2.0)
BASOS ABS: 0 10*3/uL (ref 0.0–0.1)
EOS ABS: 0.2 10*3/uL (ref 0.0–0.5)
EOS%: 2.3 % (ref 0.0–7.0)
HEMATOCRIT: 35.8 % (ref 34.8–46.6)
HGB: 12.2 g/dL (ref 11.6–15.9)
Immature Retic Fract: 3.4 % (ref 1.60–10.00)
LYMPH%: 39.8 % (ref 14.0–49.7)
MCH: 31.6 pg (ref 25.1–34.0)
MCHC: 34.1 g/dL (ref 31.5–36.0)
MCV: 92.7 fL (ref 79.5–101.0)
MONO#: 0.5 10*3/uL (ref 0.1–0.9)
MONO%: 6.3 % (ref 0.0–14.0)
NEUT#: 3.8 10*3/uL (ref 1.5–6.5)
NEUT%: 51.3 % (ref 38.4–76.8)
Platelets: 228 10*3/uL (ref 145–400)
RBC: 3.86 10*6/uL (ref 3.70–5.45)
RDW: 13.5 % (ref 11.2–14.5)
Retic %: 1.59 % (ref 0.70–2.10)
Retic Ct Abs: 61.37 10*3/uL (ref 33.70–90.70)
WBC: 7.3 10*3/uL (ref 3.9–10.3)
lymph#: 2.9 10*3/uL (ref 0.9–3.3)

## 2015-07-16 LAB — COMPREHENSIVE METABOLIC PANEL
ALK PHOS: 65 U/L (ref 40–150)
ALT: 17 U/L (ref 0–55)
AST: 22 U/L (ref 5–34)
Albumin: 4 g/dL (ref 3.5–5.0)
Anion Gap: 7 mEq/L (ref 3–11)
BUN: 17.2 mg/dL (ref 7.0–26.0)
CALCIUM: 9.8 mg/dL (ref 8.4–10.4)
CHLORIDE: 106 meq/L (ref 98–109)
CO2: 27 mEq/L (ref 22–29)
Creatinine: 1 mg/dL (ref 0.6–1.1)
EGFR: 70 mL/min/{1.73_m2} — ABNORMAL LOW (ref >90)
Glucose: 84 mg/dl (ref 70–140)
POTASSIUM: 4.3 meq/L (ref 3.5–5.1)
Sodium: 141 mEq/L (ref 136–145)
Total Bilirubin: 0.43 mg/dL (ref 0.20–1.20)
Total Protein: 7 g/dL (ref 6.4–8.3)

## 2015-07-16 MED ORDER — IOPAMIDOL (ISOVUE-300) INJECTION 61%
100.0000 mL | Freq: Once | INTRAVENOUS | Status: AC | PRN
  Administered 2015-07-16: 100 mL via INTRAVENOUS

## 2015-07-23 ENCOUNTER — Telehealth: Payer: Self-pay | Admitting: Hematology

## 2015-07-23 ENCOUNTER — Telehealth: Payer: Self-pay | Admitting: *Deleted

## 2015-07-23 ENCOUNTER — Encounter: Payer: Self-pay | Admitting: Hematology

## 2015-07-23 ENCOUNTER — Other Ambulatory Visit: Payer: Self-pay | Admitting: *Deleted

## 2015-07-23 ENCOUNTER — Ambulatory Visit (HOSPITAL_BASED_OUTPATIENT_CLINIC_OR_DEPARTMENT_OTHER): Payer: 59 | Admitting: Hematology

## 2015-07-23 VITALS — BP 116/64 | HR 68 | Temp 98.6°F | Resp 20 | Ht 63.0 in | Wt 173.1 lb

## 2015-07-23 DIAGNOSIS — F419 Anxiety disorder, unspecified: Secondary | ICD-10-CM | POA: Diagnosis not present

## 2015-07-23 DIAGNOSIS — R918 Other nonspecific abnormal finding of lung field: Secondary | ICD-10-CM

## 2015-07-23 DIAGNOSIS — C49A3 Gastrointestinal stromal tumor of small intestine: Secondary | ICD-10-CM

## 2015-07-23 NOTE — Progress Notes (Signed)
[place]  [phone] [phone]  Clinic Follow up Note   Patient Care Team: Kathyrn Lass, MD as PCP - General (Family Medicine) Earnstine Regal, PA-C as Physician Assistant (Obstetrics and Gynecology) Excell Seltzer, MD as Consulting Physician (General Surgery) Truitt Merle, MD as Consulting Physician ([place]) Tania Ade, RN as Registered Nurse (Medical Oncology) 07/23/2015  CHIEF COMPLAINTS:  Follow up GIST   Oncology History   GIST (gastrointestinal stromal tumor) of jejunum with perforation, s/p SB resection 06/07/2014   Staging form: Small Intestine, AJCC 7th Edition     Pathologic stage from 07/03/2014: Stage Unknown (T4, NX, cM0) - Unsigned       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 Recruitment of vasculature about the mass, mild nodularity overlying the adjacent descending colon.   06/07/2014 Pathologic Stage pT4,pNX / GI:low grade; mitotic rate 5/50   06/07/2014 Surgery Exploratory laparotomy resection of abdominal mass and portion of small bowel   06/07/2014 Miscellaneous tumor was tested for c-kit gene mutation, (+) p.D322 deletion/insertion NP. This predicts good response to Tyrosine kinase inhibitor.   07/18/2014 -  Chemotherapy Gleevec 400 mg po    HISTORY OF PRESENTING ILLNESS (08/15/2014):  Miranda Howe 45 y.o. female is here because of recently diagnosed GIST.   She had scatter intermittent low mid and RLQ abdominal pain and nausea and vomiting since early this year and some weight loss 15 lbs (some intensional), she was initially developed with her gynecologist and workup was negative except a cervical polyps. She presented to [facility] ED on March15 2016 with sudden onset  of severe abdominal pain, shaking chills. CT scan of the abdomen showed a large intra-abdominal mass, she was treated with IV hydration and antibiotics for septic shock. She underwent urgent exploratory laparotomy and resection of the small bowel mass on 06/07/2014 by Dr. Excell Seltzer. According to the operation note, the mass is located in the proximal jejunum, and there was an obvious opening on the and 10 mesenteric site of jejunum into the mass, possibly a diverticulum or perforating into the mass. There is no free fluid in the abdomen. She tolerated the procedure well and no was subsequently discharged home on 06/15/2014.  She has been recovering well from the surgery, off pain meds for 5-6 days, more physically active now, she has good appetite and eating well, BM is normal, no blood in stool.  she is able to do some light housework and activities, but has not been back to her for activities. She is planning to return to work on 08/03/2014. 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   CURRENT THERAPY: Gleevec '400mg'$  dialy   INTERIM HISTORY:  Freya returns for follow-up. She is doing well overall. She is compliant with Gleevec, and tolerates well overall.  she does have mild epigastric discomfort and acid reflux, possibly related to [place], she takes palms as needed. No other noticeable side effects. She works full-time as a Nature conservation officer, has good appetite and energy level, no pain or other complaints. Her weight has been stable.   SURGICAL HISTORY: Past Surgical History  Procedure Laterality Date  . Wisdom tooth extraction    . Cervical polypectomy  2010  . Bartholin gland cyst excision  01/17/2009    I&D  .  Laparotomy N/A 06/07/2014    Procedure: EXPLORATORY LAPAROTOMY RESECTION OF ABDOMINAL MASS AND PORTION OF SMALL BOWEL;  Surgeon: Excell Seltzer, MD;  Location: [facility];  Service: General;  Laterality: N/A;    SOCIAL HISTORY: History   Social History  . Marital  Status: Married    Spouse Name: N/A  . Number of Children: 2 children, age of 30 and 74   . Years of Education: N/A   Occupational History  . Not on file.   Social History Main Topics  . Smoking status: Never Smoker   . Smokeless tobacco: Never Used  . Alcohol Use: Yes     Comment: socially  . Drug Use: No  . Sexual Activity: Yes    Birth Control/ Protection: Condom, Other-see comments     Comment: pt use gel    Other Topics Concern  . 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 Heart disease Father   . Hypertension Father   . Cancer Father 68    colon cancer; currently 19  . Other Mother     varicose veins  . Cancer Mother 31    breast cancer; currently 80  . Hypertension Mother   . Diabetes Mother   . Lupus Cousin   . Cancer Paternal Grandfather     colon cancer (age?); deceased  . Cancer Maternal Aunt     uterine cancer ~67; deceased 39s  . Cancer Paternal Uncle     pancreatic; deceased 66s  . Cancer Paternal Uncle     colon cancer 39s; currently 78  . Cancer Paternal Uncle     colon cancer 45s; currently 28s    ALLERGIES:  is allergic to sulfa antibiotics.  MEDICATIONS:  Current Outpatient Prescriptions  Medication Sig Dispense Refill  . acetaminophen (TYLENOL) 325 MG tablet Take 2 tablets (650 mg total) by mouth every 6 (six) hours as needed for mild pain, moderate pain, fever or headache.    . citalopram (CELEXA) 10 MG tablet Take 10 mg by mouth daily.    Marland Kitchen ibuprofen (ADVIL,MOTRIN) 200 MG tablet Take 400 mg by mouth every 6 (six) hours as needed for moderate pain.    Marland Kitchen imatinib (GLEEVEC) 400 MG tablet Take 1 tablet (400 mg total) by mouth daily. Take with meals and large glass of water.Caution:Chemotherapy. 90 tablet 2  . Multiple Vitamin (MULTIVITAMIN) tablet Take 1 tablet by mouth daily.    Marland Kitchen oxyCODONE  (OXY IR/ROXICODONE) 5 MG immediate release tablet Take 1-2 tablets (5-10 mg total) by mouth every 4 (four) hours as needed for moderate pain. 50 tablet 0   No current facility-administered medications for this visit.    REVIEW OF SYSTEMS:   Constitutional: Denies fevers, chills or abnormal night sweats.  Eyes: Denies blurriness of vision, double vision or watery eyes Ears, nose, mouth, throat, and face: Denies mucositis or sore throat Respiratory: Denies cough, dyspnea or wheezes Cardiovascular: Denies palpitation, chest discomfort or lower extremity swelling Gastrointestinal:  Denies nausea, heartburn or change in bowel habits Skin: Denies abnormal skin rashes Lymphatics: Denies new lymphadenopathy or easy bruising Neurological:Denies numbness, tingling or new weaknesses Behavioral/Psych: Mood is stable, no new changes  All other systems were reviewed with the patient and are negative.  PHYSICAL EXAMINATION: ECOG PERFORMANCE STATUS: 0  Filed Vitals:   07/23/15 1313  BP:  116/64  Pulse: 68  Temp: 98.6 F (37 C)  Resp: 20   Filed Weights   07/23/15 1313  Weight: 173 lb 1.6 oz (78.518 kg)    GENERAL:alert, no distress and comfortable SKIN: skin color, texture, turgor are normal, no rashes or significant lesions EYES: normal, conjunctiva are pink and non-injected, sclera clear OROPHARYNX:no exudate, no erythema and lips, buccal mucosa, and tongue normal  NECK: supple, thyroid normal size, non-tender, without nodularity LYMPH:  no palpable lymphadenopathy in the cervical, axillary or inguinal LUNGS: clear to auscultation and percussion with normal breathing effort HEART: regular rate & rhythm and no murmurs and no lower extremity edema ABDOMEN:abdomen soft, non-tender and normal bowel sounds. Midline surgical scar is well-healed.  Musculoskeletal:no cyanosis of digits and no clubbing  PSYCH: alert & oriented x 3 with fluent speech NEURO: no focal motor/sensory  deficits  LABORATORY DATA:  I have reviewed the data as listed CBC Latest Ref Rng 07/16/2015 04/23/2015 01/22/2015  WBC 3.9 - 10.3 10e3/uL 7.3 8.3 8.5  Hemoglobin 11.6 - 15.9 g/dL 12.2 12.0 12.3  Hematocrit 34.8 - 46.6 % 35.8 35.2 37.1  Platelets 145 - 400 10e3/uL 228 262 231     CMP Latest Ref Rng 07/16/2015 04/23/2015 01/22/2015  Glucose 70 - 140 mg/dl 84 87 90  BUN 7.0 - 26.0 mg/dL 17.2 16.3 16.7  Creatinine 0.6 - 1.1 mg/dL 1.0 1.1 0.9  Sodium 136 - 145 mEq/L 141 140 140  Potassium 3.5 - 5.1 mEq/L 4.3 4.2 4.7  CO2 22 - 29 mEq/L '27 24 25  '$ Calcium 8.4 - 10.4 mg/dL 9.8 9.1 9.7  Total Protein 6.4 - 8.3 g/dL 7.0 6.9 6.8  Total Bilirubin 0.20 - 1.20 mg/dL 0.43 0.38 <0.30  Alkaline Phos 40 - 150 U/L 65 82 75  AST 5 - 34 U/L '22 19 20  '$ ALT 0 - 55 U/L '17 19 21     '$ PATHOLOGY REPORT Diagnosis 06/07/2014 Small intestine, resection for tumor, Abdominal mass - GASTROINTESTINAL STROMAL TUMOR, 10.2 CM WITH CENTRAL NECROTIC CAVITY. - SEE ONCOLOGY TABLE. Microscopic Comment GASTROINTESTINAL STROMAL TUMOR (GIST): Procedure: Resection. Tumor Site: Small bowel. Tumor Size: 10.2 cm. Tumor Focality: Unifocal. GIST Subtype: Spindle cell. Mitotic Rate: 2/50 HIGH POWER FIELD Necrosis: Present, central. Histologic Grade: G1: Low grade; mitotic rate 5/50 HIGH POWER FIELD. Risk Assessment: High risk. Margins: Free of tumor. Distance of tumor from closest margin: Less than 0.2 cm. Ancillary testing: Immunohistochemistry. Lymph nodes: number examined: 0; number positive: N/A Pathologic Staging: pT4, pNX Comments: The tumor is composed of spindle cells with minimal to mild cytologic atypia and rare mitotic figures (2 per 50 high power field). Much of the central portion of the tumor is cavitary with associated necrosis and the overlying small bowel mucosa is ulcerated and communicates with the central cavity within the tumor. The tumor extends to the subserosal connective tissue and is focally  adjacent to the visceral peritoneum. Immunohistochemistry shows strong positivity with CD117 and smooth muscle actin and negative staining with S100, desmin and muscle specific actin. (JDP:ecj 06/09/2014)    RADIOGRAPHIC STUDIES: I have personally reviewed the radiological images as listed and agreed with the findings in the report.   CT abdomen and pelvis w contrast 07/16/2015 IMPRESSION: 1. No local recurrence of the GI stromal tumor. 2. There is a solid 4 mm right middle lobe nodule and a potential sub solid 4 mm right middle lobe nodule, which were not present back on 06/07/2014. Specific guidelines do not apply given the history  of malignancy, but consider follow up chest CT in 3-6 months time. 3. New right ovarian cystic lesion is mildly hyperdense and probably complex.    ASSESSMENT & PLAN:  45 year old Caucasian female without significant past medical history except anxiety who was found to have a 10 cm proximal jejunal mass, with perforation.   1. 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 -Giving the size of her tumor, this is stage III. Although it's low grade, giving the large size and perforation, this is consider a high risk tumor, the risk of recurrence is about 50%.   -The nature of the disease was reviewed with patient and her husband in great details, patient had many questions and I answered to her satisfaction. -I recommend adjuvant Gleevec for 3 years to reduce the risk of recurrence in the future. -I personally reviewed her CT scan images from 07/16/2015 with pt and her husband, which showed no definitive evidence of recurrence. The two 4 mm small nodules in the right lung are likely incidental finding, although it's new from previous. I recommend follow-up CT chest in 3 months. The right  ovary and cyst needs to be followed with her gynecologist. She will call for appointment. I asked her to make a copy of the CT scan from [facility] radiology and bring to her gynecologist for review.  -She is very nervous about the possibility of recurrence, was very concerned about the CT scan findings. We had a long discussion. I answered all her questions.  -She tolerating Gleevec well, without significant side effects. We'll continue -continue surveillance. Per NCCN, patient will be seen and exam every 3-6 months for 5 years, then annually. CT scan will be obtained every 6 months for 5 years, then annually. -lab results were reviewed with her, her CBC and CMP are unremarkable.  2. Anxiety -Continue Celexa.  3. Right lung nodules -she was found to have to 4 mm small nodules in the right lung on CT scan 07/16/2015. -She never smoked, risk of lung cancer is low -I recommend follow-up CT scan in 3 months  4. Right ovarian cyst -Nu on the CT scan 07/16/2015 -I recommend her to follow-up with her gynecologist in the next month -I'll copy my note to her gynecologist   Plan -continue [place] -Follow up with her gynecologist with a month for her right ovarian cyst -Return to clinic in 3 months with lab and CT chest without contrast. I plan to repeat CT abd/pel (possible with chest) in 6 months.   All questions were answered. The patient knows to call the clinic with any problems, questions or concerns.  I spent 20 minutes counseling the patient face to face. The total time spent in the appointment was 25 minutes and more than 50% was on counseling.     Truitt Merle, MD 07/23/2015 8:04 AM

## 2015-07-23 NOTE — Telephone Encounter (Signed)
per pof to sch pt appt-pt spouse stated may change and will cll Dr Ernestina Penna nurse to advise

## 2015-07-23 NOTE — Telephone Encounter (Signed)
Received call from husband Merry Proud re:  CT scan ordered by Dr. Burr Medico for 10/15/15 and office visit 10/19/15 will need to be rescheduled.   Merry Proud stated CT scan needs to be scheduled during week of  8/14 - 11/09/15.   Spoke with Vivien Rota in radiology scheduling and informed her of pt's availability in August.   Nurse will reschedule office visit after scan date is set. Merry Proud also informed Dr. Burr Medico   of pt's  OB/GYN  -   Earnstine Regal, [place] [facility]   Phone      [phone]     ;      [facility]     Phone       [phone].

## 2015-07-24 ENCOUNTER — Telehealth: Payer: Self-pay | Admitting: Hematology

## 2015-07-24 ENCOUNTER — Other Ambulatory Visit: Payer: Self-pay | Admitting: *Deleted

## 2015-07-24 MED FILL — GLEEVEC 400 MG TABLET: 400 | 30 days supply | Qty: 30 | Fill #7

## 2015-07-24 NOTE — Telephone Encounter (Signed)
Added appt, pt already aware per pof

## 2015-07-24 NOTE — Telephone Encounter (Signed)
Spoke with husband Merry Proud and gave Merry Proud appts for pt's follow up on August 17.   Informed Merry Proud that Dr. Burr Medico had sent message to pt's GYN provider, and her office will contact pt with appt soon.  Merry Proud voiced understanding and stated he would relay message to pt. POF sent to scheduler.

## 2015-07-24 NOTE — Telephone Encounter (Signed)
Thu,  Please follow up on her f/u appointment if needs to be rescheduled. If you call her back, please also let her know that I sent a message to her GYN, they should call her soon for appointment.   Miranda Howe  07/24/2015

## 2015-09-01 MED FILL — GLEEVEC 400 MG TABLET: 400 | 30 days supply | Qty: 30 | Fill #8

## 2015-10-12 ENCOUNTER — Other Ambulatory Visit: Payer: Self-pay | Admitting: Hematology

## 2015-10-12 DIAGNOSIS — C49A3 Gastrointestinal stromal tumor of small intestine: Secondary | ICD-10-CM

## 2015-10-12 MED FILL — GLEEVEC 400 MG TABLET: 400 | 90 days supply | Qty: 90 | Fill #0

## 2015-10-15 ENCOUNTER — Other Ambulatory Visit: Payer: 59

## 2015-10-19 ENCOUNTER — Ambulatory Visit: Payer: 59 | Admitting: Hematology

## 2015-11-06 ENCOUNTER — Ambulatory Visit (HOSPITAL_COMMUNITY)
Admission: RE | Admit: 2015-11-06 | Discharge: 2015-11-06 | Disposition: A | Discharge status: Home / Self Care | Payer: 59 | Source: Ambulatory Visit | Attending: Hematology | Admitting: Hematology

## 2015-11-06 DIAGNOSIS — R918 Other nonspecific abnormal finding of lung field: Secondary | ICD-10-CM | POA: Insufficient documentation

## 2015-11-07 ENCOUNTER — Telehealth: Payer: Self-pay | Admitting: Hematology

## 2015-11-07 NOTE — Telephone Encounter (Signed)
APPT RESCHEDULED PER PATIENT REQUEST. 11/07/15

## 2015-11-08 ENCOUNTER — Ambulatory Visit: Payer: 59 | Admitting: Hematology

## 2015-11-08 ENCOUNTER — Telehealth: Payer: Self-pay | Admitting: Hematology

## 2015-11-08 ENCOUNTER — Other Ambulatory Visit: Payer: 59

## 2015-11-08 NOTE — Telephone Encounter (Signed)
PATIENT CALLED TO RESCHEDULE APPT on 11/07/15. RETURNED PT CALL TO [facility] APPT DATE OF 12/11/15 . L/M.  APPT LETTER AND SCHEDULE MAILED.

## 2015-12-11 ENCOUNTER — Ambulatory Visit (HOSPITAL_BASED_OUTPATIENT_CLINIC_OR_DEPARTMENT_OTHER): Payer: 59 | Admitting: Hematology

## 2015-12-11 ENCOUNTER — Encounter: Payer: Self-pay | Admitting: Hematology

## 2015-12-11 ENCOUNTER — Other Ambulatory Visit (HOSPITAL_BASED_OUTPATIENT_CLINIC_OR_DEPARTMENT_OTHER): Payer: 59

## 2015-12-11 VITALS — BP 119/72 | HR 70 | Temp 98.8°F | Resp 16 | Ht 63.0 in | Wt 169.4 lb

## 2015-12-11 DIAGNOSIS — C49A3 Gastrointestinal stromal tumor of small intestine: Secondary | ICD-10-CM | POA: Diagnosis not present

## 2015-12-11 DIAGNOSIS — F419 Anxiety disorder, unspecified: Secondary | ICD-10-CM | POA: Diagnosis not present

## 2015-12-11 LAB — CBC & DIFF AND RETIC
BASO%: 0.4 % (ref 0.0–2.0)
Basophils Absolute: 0 10*3/uL (ref 0.0–0.1)
EOS%: 3.3 % (ref 0.0–7.0)
Eosinophils Absolute: 0.3 10*3/uL (ref 0.0–0.5)
HCT: 36.1 % (ref 34.8–46.6)
HEMOGLOBIN: 12.4 g/dL (ref 11.6–15.9)
Immature Retic Fract: 1.7 % (ref 1.60–10.00)
LYMPH%: 36.3 % (ref 14.0–49.7)
MCH: 31.7 pg (ref 25.1–34.0)
MCHC: 34.3 g/dL (ref 31.5–36.0)
MCV: 92.3 fL (ref 79.5–101.0)
MONO#: 0.6 10*3/uL (ref 0.1–0.9)
MONO%: 7.3 % (ref 0.0–14.0)
NEUT%: 52.7 % (ref 38.4–76.8)
NEUTROS ABS: 4 10*3/uL (ref 1.5–6.5)
Platelets: 272 10*3/uL (ref 145–400)
RBC: 3.91 10*6/uL (ref 3.70–5.45)
RDW: 13.7 % (ref 11.2–14.5)
Retic %: 1.36 % (ref 0.70–2.10)
Retic Ct Abs: 53.18 10*3/uL (ref 33.70–90.70)
WBC: 7.6 10*3/uL (ref 3.9–10.3)
lymph#: 2.8 10*3/uL (ref 0.9–3.3)

## 2015-12-11 LAB — COMPREHENSIVE METABOLIC PANEL
ALT: 20 U/L (ref 0–55)
ANION GAP: 9 meq/L (ref 3–11)
AST: 20 U/L (ref 5–34)
Albumin: 3.9 g/dL (ref 3.5–5.0)
Alkaline Phosphatase: 85 U/L (ref 40–150)
BILIRUBIN TOTAL: 0.39 mg/dL (ref 0.20–1.20)
BUN: 14.8 mg/dL (ref 7.0–26.0)
CO2: 26 meq/L (ref 22–29)
Calcium: 9.7 mg/dL (ref 8.4–10.4)
Chloride: 106 mEq/L (ref 98–109)
Creatinine: 1.1 mg/dL (ref 0.6–1.1)
EGFR: 58 mL/min/{1.73_m2} — AB (ref >90)
Glucose: 92 mg/dl (ref 70–140)
Potassium: 4.6 mEq/L (ref 3.5–5.1)
Sodium: 141 mEq/L (ref 136–145)
TOTAL PROTEIN: 7 g/dL (ref 6.4–8.3)

## 2015-12-11 NOTE — Progress Notes (Signed)
[place]  [phone] [phone]  Clinic Follow up Note   Patient Care Team: Kathyrn Lass, MD as PCP - General (Family Medicine) Earnstine Regal, PA-C as Physician Assistant (Obstetrics and Gynecology) Excell Seltzer, MD as Consulting Physician (General Surgery) Truitt Merle, MD as Consulting Physician ([place]) Tania Ade, RN as Registered Nurse (Medical Oncology) Earnstine Regal, PA-C as Physician Assistant (Obstetrics and Gynecology) 12/11/2015  CHIEF COMPLAINTS:  Follow up GIST   Oncology History   GIST (gastrointestinal stromal tumor) of jejunum with perforation, s/p SB resection 06/07/2014   Staging form: Small Intestine, AJCC 7th Edition     Pathologic stage from 07/03/2014: Stage Unknown (T4, NX, cM0) - Unsigned       GIST (gastrointestinal stromal tumor) of jejunum with perforation, s/p SB resection 06/07/2014   06/07/2014 Initial Diagnosis    GIST (gastrointestinal stromal tumor) of jejunum with perforation, s/p SB resection 06/07/2014      06/07/2014 Imaging    9.3 X 8.4 X 7.5 cm mass left lower quadrant. Recruitment of vasculature about the mass, mild nodularity overlying the adjacent descending colon.      06/07/2014 Pathologic Stage    pT4,pNX / GI:low grade; mitotic rate 5/50      06/07/2014 Surgery    Exploratory laparotomy resection of abdominal mass and portion of small bowel      06/07/2014 Miscellaneous    tumor was tested for c-kit gene mutation, (+) p.H417 deletion/insertion NP. This predicts good response to Tyrosine kinase inhibitor.      07/18/2014 -  Chemotherapy    Gleevec 400 mg po       HISTORY OF PRESENTING ILLNESS (08/15/2014):  Miranda Howe 45 y.o. female is here because of recently diagnosed GIST.   She had scatter intermittent low mid and RLQ abdominal pain and nausea and vomiting since early this year and some weight loss 15 lbs (some intensional), she was initially developed with her  gynecologist and workup was negative except a cervical polyps. She presented to [facility] ED on March15 2016 with sudden onset of severe abdominal pain, shaking chills. CT scan of the abdomen showed a large intra-abdominal mass, she was treated with IV hydration and antibiotics for septic shock. She underwent urgent exploratory laparotomy and resection of the small bowel mass on 06/07/2014 by Dr. Excell Seltzer. According to the operation note, the mass is located in the proximal jejunum, and there was an obvious opening on the and 10 mesenteric site of jejunum into the mass, possibly a diverticulum or perforating into the mass. There is no free fluid in the abdomen. She tolerated the procedure well and no was subsequently discharged home on 06/15/2014.  She has been recovering well from the surgery, off pain meds for 5-6 days, more physically active now, she has good appetite and eating well, BM is normal, no blood in stool.  she is able to do some light housework and activities, but has not been back to her for activities. She is planning to return to work on 08/03/2014. 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   CURRENT THERAPY: Gleevec '400mg'$  dialy   INTERIM HISTORY:  Miranda Howe returns for follow-up. She is doing well overall. She is compliant with Gleevec, and tolerates well overall.  She has mild indigestion, for which she takes Toms as needed. She denies any abdominal discomfort, bloating, nausea, or other discomfort. Her bowel is normal. She has good appetite and energy level, works full-time as the principal of [employer].   SURGICAL HISTORY: Past Surgical History:  Procedure Laterality Date  . BARTHOLIN GLAND CYST EXCISION  01/17/2009   I&D  . CERVICAL POLYPECTOMY  2010  . LAPAROTOMY N/A 06/07/2014   Procedure: EXPLORATORY LAPAROTOMY RESECTION OF ABDOMINAL MASS AND PORTION OF SMALL BOWEL;  Surgeon: Excell Seltzer, MD;  Location: [facility];  Service: General;  Laterality: N/A;    . WISDOM TOOTH EXTRACTION      SOCIAL HISTORY: History   Social History  . Marital Status: Married    Spouse Name: N/A  . Number of Children: 2 children, age of 89 and 64   . Years of Education: N/A   Occupational History  . Not on file.   Social History Main Topics  . Smoking status: Never Smoker   . Smokeless tobacco: Never Used  . Alcohol Use: Yes     Comment: socially  . Drug Use: No  . Sexual Activity: Yes    Birth Control/ Protection: Condom, Other-see comments     Comment: pt use gel    Other Topics Concern  . 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 Heart disease Father   . Hypertension Father   . Cancer Father 25    colon cancer; currently 40  . Other Mother     varicose veins  . Cancer Mother 42    breast cancer; currently 54  . Hypertension Mother   . Diabetes Mother   . Lupus Cousin   . Cancer Paternal Grandfather     colon cancer (age?); deceased  . Cancer Maternal Aunt     uterine cancer ~67; deceased 52s  . Cancer Paternal Uncle     pancreatic; deceased 76s  . Cancer Paternal Uncle     colon cancer 72s; currently 60  . Cancer Paternal Uncle     colon cancer 70s; currently 59s    ALLERGIES:  is allergic to sulfa antibiotics.  MEDICATIONS:  Current Outpatient Prescriptions  Medication Sig Dispense Refill  . acetaminophen (TYLENOL) 325 MG tablet Take 2 tablets (650 mg total) by mouth every 6 (six) hours as needed for mild pain, moderate pain, fever or headache. (Patient not taking: Reported on 07/23/2015)    . citalopram (CELEXA) 10 MG tablet Take 10 mg by mouth daily.    Marland Kitchen GLEEVEC 400 MG tablet TAKE 1 TABLET BY MOUTH ONCE DAILY WITH MEALS AND A LARGE GLASS OF WATER (CAUTION:CHEMOTHERAPY) 90 tablet 2  . ibuprofen (ADVIL,MOTRIN) 200 MG tablet Take 400 mg by mouth every 6 (six) hours as needed  for moderate pain.    . Multiple Vitamin (MULTIVITAMIN) tablet Take 1 tablet by mouth daily.    Marland Kitchen oxyCODONE (OXY IR/ROXICODONE) 5 MG immediate release tablet Take 1-2 tablets (5-10 mg total) by mouth every 4 (four) hours as needed for moderate pain. (Patient not taking: Reported on 07/23/2015) 50 tablet 0   No current facility-administered medications for this visit.     REVIEW OF SYSTEMS:   Constitutional: Denies fevers, chills or abnormal night sweats.  Eyes: Denies blurriness of vision, double vision or watery eyes Ears, nose, mouth, throat, and face: Denies mucositis or sore throat Respiratory: Denies cough, dyspnea or wheezes Cardiovascular: Denies palpitation, chest discomfort or lower extremity swelling Gastrointestinal:  Denies nausea, heartburn or change in bowel habits Skin: Denies abnormal  skin rashes Lymphatics: Denies new lymphadenopathy or easy bruising Neurological:Denies numbness, tingling or new weaknesses Behavioral/Psych: Mood is stable, no new changes  All other systems were reviewed with the patient and are negative.  PHYSICAL EXAMINATION: ECOG PERFORMANCE STATUS: 0  Vitals:   12/11/15 1324  BP: 119/72  Pulse: 70  Resp: 16  Temp: 98.8 F (37.1 C)   Filed Weights   12/11/15 1324  Weight: 169 lb 6.4 oz (76.8 kg)    GENERAL:alert, no distress and comfortable SKIN: skin color, texture, turgor are normal, no rashes or significant lesions EYES: normal, conjunctiva are pink and non-injected, sclera clear OROPHARYNX:no exudate, no erythema and lips, buccal mucosa, and tongue normal  NECK: supple, thyroid normal size, non-tender, without nodularity LYMPH:  no palpable lymphadenopathy in the cervical, axillary or inguinal LUNGS: clear to auscultation and percussion with normal breathing effort HEART: regular rate & rhythm and no murmurs and no lower extremity edema ABDOMEN:abdomen soft, non-tender and normal bowel sounds. Midline surgical scar is well-healed.   Musculoskeletal:no cyanosis of digits and no clubbing  PSYCH: alert & oriented x 3 with fluent speech NEURO: no focal motor/sensory deficits  LABORATORY DATA:  I have reviewed the data as listed CBC Latest Ref Rng & Units 12/11/2015 07/16/2015 04/23/2015  WBC 3.9 - 10.3 10e3/uL 7.6 7.3 8.3  Hemoglobin 11.6 - 15.9 g/dL 12.4 12.2 12.0  Hematocrit 34.8 - 46.6 % 36.1 35.8 35.2  Platelets 145 - 400 10e3/uL 272 228 262     CMP Latest Ref Rng & Units 07/16/2015 04/23/2015 01/22/2015  Glucose 70 - 140 mg/dl 84 87 90  BUN 7.0 - 26.0 mg/dL 17.2 16.3 16.7  Creatinine 0.6 - 1.1 mg/dL 1.0 1.1 0.9  Sodium 136 - 145 mEq/L 141 140 140  Potassium 3.5 - 5.1 mEq/L 4.3 4.2 4.7  Chloride 96 - 112 mmol/L - - -  CO2 22 - 29 mEq/L '27 24 25  '$ Calcium 8.4 - 10.4 mg/dL 9.8 9.1 9.7  Total Protein 6.4 - 8.3 g/dL 7.0 6.9 6.8  Total Bilirubin 0.20 - 1.20 mg/dL 0.43 0.38 <0.30  Alkaline Phos 40 - 150 U/L 65 82 75  AST 5 - 34 U/L '22 19 20  '$ ALT 0 - 55 U/L '17 19 21     '$ PATHOLOGY REPORT Diagnosis 06/07/2014 Small intestine, resection for tumor, Abdominal mass - GASTROINTESTINAL STROMAL TUMOR, 10.2 CM WITH CENTRAL NECROTIC CAVITY. - SEE ONCOLOGY TABLE. Microscopic Comment GASTROINTESTINAL STROMAL TUMOR (GIST): Procedure: Resection. Tumor Site: Small bowel. Tumor Size: 10.2 cm. Tumor Focality: Unifocal. GIST Subtype: Spindle cell. Mitotic Rate: 2/50 HIGH POWER FIELD Necrosis: Present, central. Histologic Grade: G1: Low grade; mitotic rate 5/50 HIGH POWER FIELD. Risk Assessment: High risk. Margins: Free of tumor. Distance of tumor from closest margin: Less than 0.2 cm. Ancillary testing: Immunohistochemistry. Lymph nodes: number examined: 0; number positive: N/A Pathologic Staging: pT4, pNX Comments: The tumor is composed of spindle cells with minimal to mild cytologic atypia and rare mitotic figures (2 per 50 high power field). Much of the central portion of the tumor is cavitary with associated necrosis  and the overlying small bowel mucosa is ulcerated and communicates with the central cavity within the tumor. The tumor extends to the subserosal connective tissue and is focally adjacent to the visceral peritoneum. Immunohistochemistry shows strong positivity with CD117 and smooth muscle actin and negative staining with S100, desmin and muscle specific actin. (JDP:ecj 06/09/2014)    RADIOGRAPHIC STUDIES: I have personally reviewed the radiological images as listed and agreed  with the findings in the report.   CT abdomen and pelvis w contrast 07/16/2015 IMPRESSION: 1. No local recurrence of the GI stromal tumor. 2. There is a solid 4 mm right middle lobe nodule and a potential sub solid 4 mm right middle lobe nodule, which were not present back on 06/07/2014. Specific guidelines do not apply given the history of malignancy, but consider follow up chest CT in 3-6 months time. 3. New right ovarian cystic lesion is mildly hyperdense and probably complex.  Ct chest wo contrast 11/06/2015 IMPRESSION: 1. No significant change in tiny bilateral pulmonary nodules. 2.  No acute process or evidence of metastatic disease in the chest.   ASSESSMENT & PLAN:  45 year old Caucasian female without significant past medical history except anxiety who was found to have a 10 cm proximal jejunal mass, with perforation.   1. 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 -Giving the size of her tumor, this is stage III. Although it's low grade, giving the large size and perforation, this is consider a high risk tumor, the risk of recurrence is about 50%.   -The nature of the disease was reviewed with patient and her husband in great details, patient had many questions and I answered to her satisfaction. -I recommend adjuvant Gleevec  for 3 years to reduce the risk of recurrence in the future. -I personally reviewed her CT scan images from 07/16/2015 with pt and her husband, which showed no definitive evidence of recurrence. The two 4 mm small nodules in the right lung are likely incidental finding, although it's new from previous. I recommend follow-up CT chest in 3 months.  -She tolerating Gleevec well, without significant side effects. We'll continue -continue surveillance. Per NCCN, patient will be seen and exam every 3-6 months for 5 years, then annually. CT scan will be obtained every 6 months for 5 years, then annually. -She is clinically doing very well, her physical exam today is normal, lab results were reviewed with her, no clinical suspicion for recurrence.  2. Anxiety -Continue Celexa.  3. Right lung nodules -she was found to have to 4 mm small nodules in the right lung on CT scan 07/16/2015. -She never smoked, risk of lung cancer is low -Her follow-up CT scan in August 2017showed stable tiny lung nodules, likely benign.   Plan -continue [place] -Return to clinic in 3 months with lab and CT abd/pel a few days before   All questions were answered. The patient knows to call the clinic with any problems, questions or concerns.  I spent 15 minutes counseling the patient face to face. The total time spent in the appointment was 20 minutes and more than 50% was on counseling.     Truitt Merle, MD 12/11/2015 1:32 PM

## 2015-12-19 ENCOUNTER — Telehealth: Payer: Self-pay | Admitting: Hematology

## 2015-12-19 NOTE — Telephone Encounter (Signed)
Left message for patient re December lab/fu. Central radiology will call re scan. Schedule mailed.

## 2016-02-04 MED FILL — GLEEVEC 400 MG TABLET: 400 | 90 days supply | Qty: 90 | Fill #1

## 2016-03-04 IMAGING — CT CT CTA ABD/PEL W/CM AND/OR W/O CM
1 of 3 series · 10 of 32 positions shown, 13 images · IV contrast (OMNIPAQUE 300)
Comparison: None.

CLINICAL DATA: Acute onset of chest pain for 2 hours, radiating to
the back. Nausea, vomiting, shortness of breath and diaphoresis.
Elevated D-dimer. Arm tingling and leg weakness. Initial encounter.

EXAM:
CT ANGIOGRAPHY CHEST, ABDOMEN AND PELVIS
TECHNIQUE: Multidetector CT imaging through the chest, abdomen and pelvis was
performed using the standard protocol during bolus administration of
intravenous contrast. Multiplanar reconstructed images and MIPs were
obtained and reviewed to evaluate the vascular anatomy.
CONTRAST:  100mL OMNIPAQUE IOHEXOL 350 MG/ML SOLN

[Series 5: arterial 3.0 b30f · axial · arterial · 0.74mm/px · z∈[-435,+45]mm · 10 of 192 slices shown, 13 images]
[im 21/192  soft-tissue]
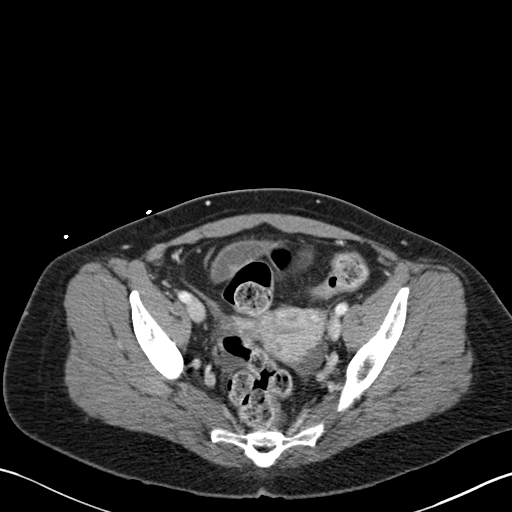
[im 21/192  bone]
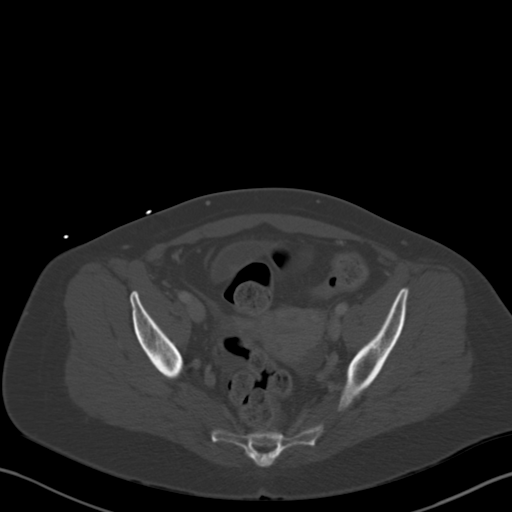
[im 41/192  soft-tissue]
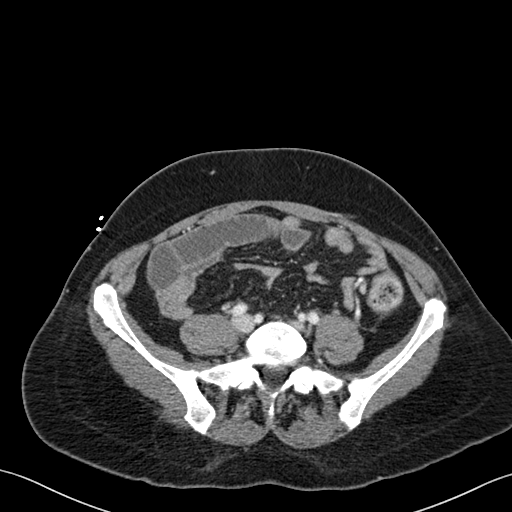
[im 61/192  soft-tissue]
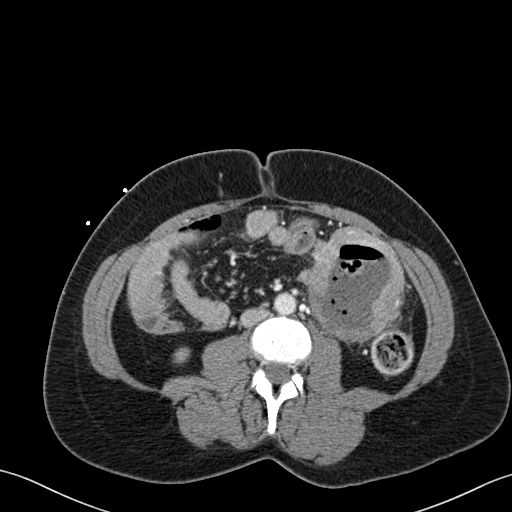
[im 81/192  soft-tissue]
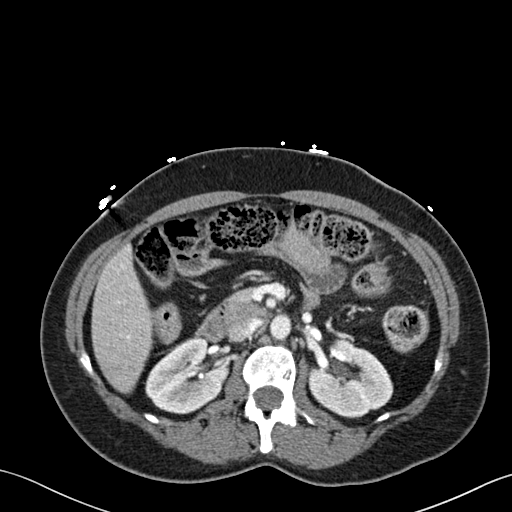
[im 111/192  soft-tissue]
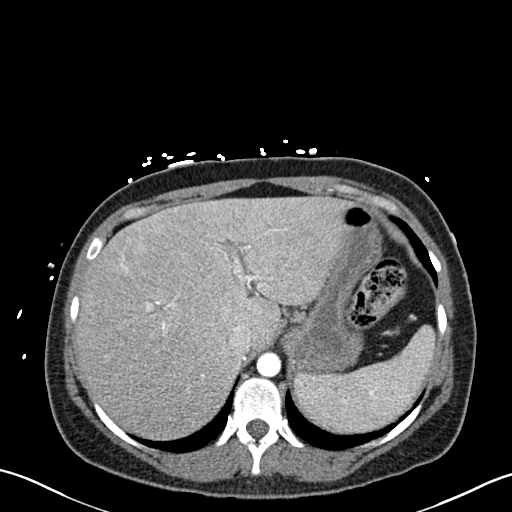
[im 131/192  soft-tissue]
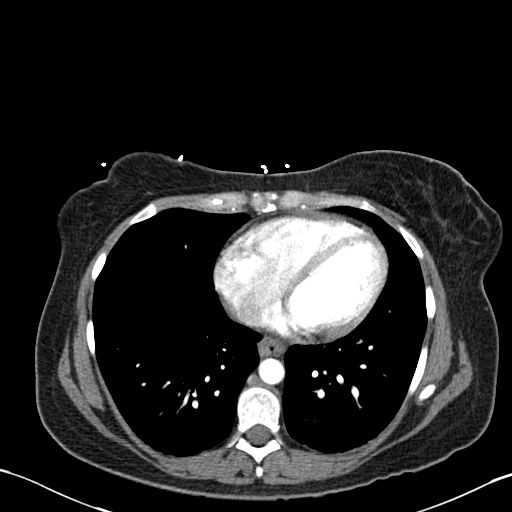
[im 151/192  soft-tissue]
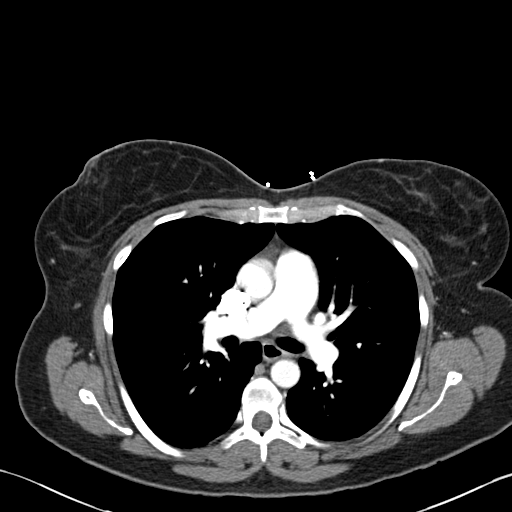
[im 151/192  lung]
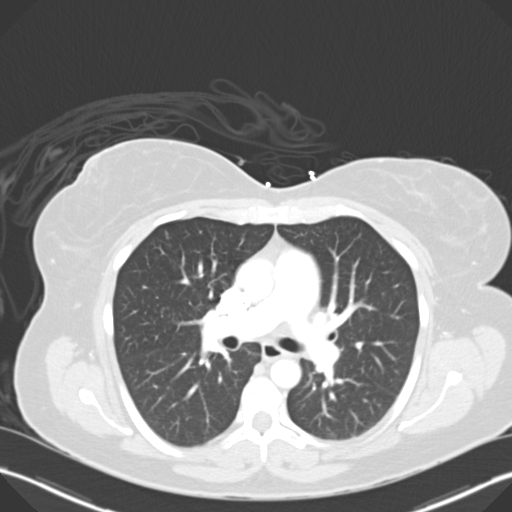
[im 161/192  lung]
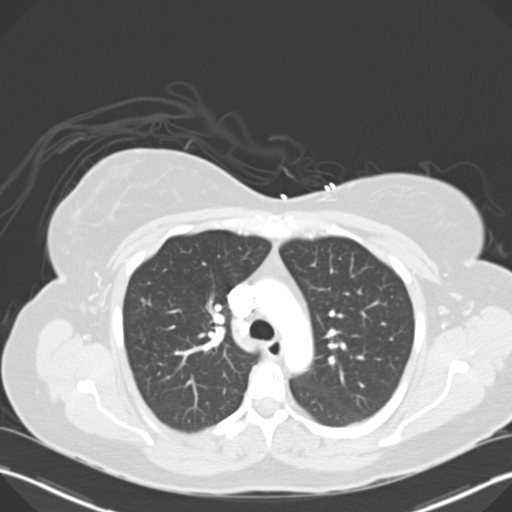
[im 171/192  soft-tissue]
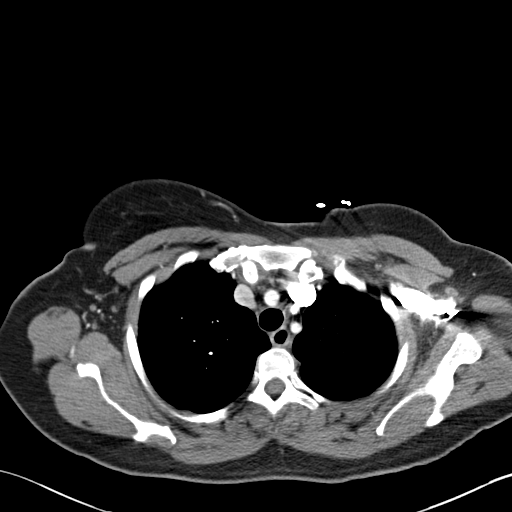
[im 171/192  lung]
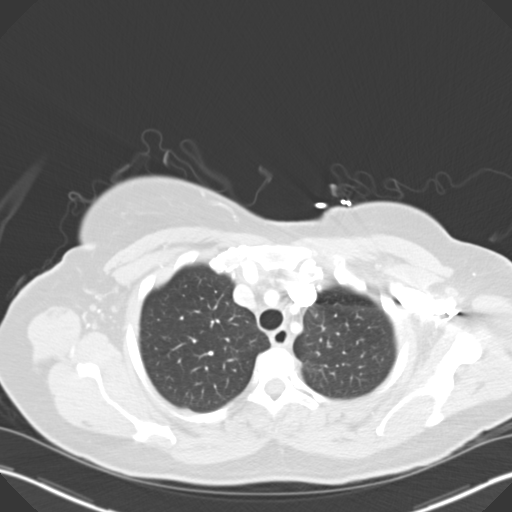
[im 181/192  lung]
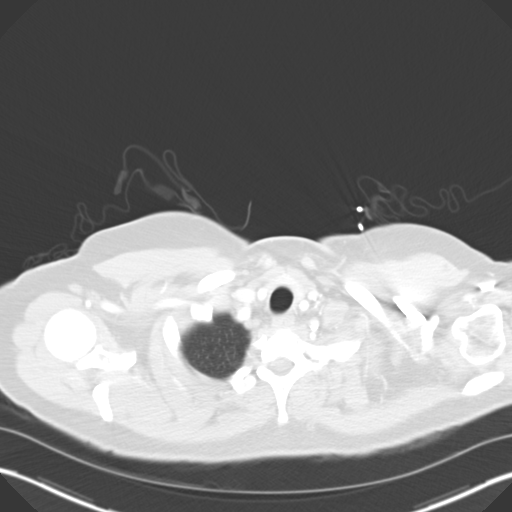

[10 of 32 positions shown; findings below may reference images not displayed]

FINDINGS: CTA CHEST FINDINGS

There is no evidence of aortic dissection. There is no evidence of
aneurysmal dilatation. No calcific atherosclerotic disease is seen.
The great vessels are unremarkable in appearance.

There is no evidence of pulmonary embolus.

Minimal bibasilar atelectasis is noted. Minimal nodularity at the
lung apices likely also reflects atelectasis, though minimal
infection might have a similar appearance. There is no evidence of
pleural effusion or pneumothorax. No masses are identified; no
abnormal focal contrast enhancement is seen.

The mediastinum is unremarkable in appearance. A tiny calcified node
is seen adjacent to the distal esophagus. There is question of mild
distal esophageal wall thickening, which could reflect minimal
esophagitis, or could remain within normal limits. No pericardial
effusion is identified. No axillary lymphadenopathy is seen. The
visualized portions of the thyroid gland are unremarkable in
appearance.

Mild nodularity to the left fibroglandular tissue is thought to
remain within normal limits. No suspicious soft tissue masses are
seen within the chest.

No acute osseous abnormalities are seen.

Review of the MIP images confirms the above findings.

CTA ABDOMEN AND PELVIS FINDINGS

There is no evidence of aortic dissection. There is no evidence of
aneurysmal dilatation. No calcific atherosclerotic disease is seen.
Incidental note is made of a circumaortic left renal vein. The
celiac trunk, superior mesenteric artery, bilateral renal arteries
and inferior mesenteric artery appear patent. The inferior vena cava
is unremarkable in appearance.

There is a large complex mass at the left lower quadrant. It
measures approximately 9.3 x 8.4 x 7.5 cm, with a large amount of
fluid and air noted centrally, and a diffusely irregular peripheral
soft tissue border. Mild surrounding soft tissue inflammation is
seen. This is suggested to be contiguous with the adjacent mid
jejunum.

Given its appearance and the location, this is suspicious for
aneurysmal bowel dilatation due to small bowel lymphoma. Jejunal
adenocarcinoma can mimic this appearance, and metastatic disease as
might be seen with melanoma can also have such an appearance, though
there are no additional findings seen to suggest melanoma.

A centrally necrotic tumor is thought to be less likely, given the
relatively typical appearance for small bowel lymphoma. A walled-off
abscess is also very unlikely, given recruitment of vasculature
surrounding the mass, adjacent mild nodularity overlying the
descending colon, and a few prominent associated mesenteric nodes
measuring up to 1.0 cm in size (best seen on coronal images).

Incidental note is made of underlying small bowel malrotation; the
duodenum does not pass across the midline. However, the cecum and
appendix are still noted on the right side.

There is a somewhat heterogeneous appearance to hepatic enhancement,
which may reflect underlying fatty infiltration or mild venous
congestion. No definite focal mass is seen. The spleen is
unremarkable in appearance. The gallbladder is within normal limits.
Hepatic hilar nodes remain normal in size. The pancreas and adrenal
glands are unremarkable.

A 1.0 cm cyst is noted at the upper pole of the right kidney. The
kidneys are otherwise unremarkable. There is no evidence of
hydronephrosis. No renal or ureteral stones are seen. No perinephric
stranding is appreciated.

Trace fluid within the pelvis may be physiologic in nature, or could
be related to the left lower quadrant abdominal mass. The stomach is
within normal limits.

The appendix is normal in caliber, without evidence for
appendicitis. The colon is partially filled with stool and is
unremarkable in appearance, aside from mild adjacent nodularity
along the descending colon as described above, at the level of the
mass.

The bladder is mildly distended and grossly unremarkable. The uterus
has a somewhat heterogeneous appearance, but remains normal in size.
This may reflect multiple tiny fibroids. The ovaries are relatively
symmetric. No suspicious adnexal masses are seen. No inguinal
lymphadenopathy is seen.

No acute osseous abnormalities are identified.

Review of the MIP images confirms the above findings.
IMPRESSION: 1. No evidence of aortic dissection. No evidence of aneurysmal
dilatation. No calcific atherosclerotic disease seen.
2. Large complex 9.3 x 8.4 x 7.5 cm mass noted at the left lower
quadrant. It contains a large amount of fluid and air, with a
diffusely irregular peripheral soft tissue or. Mild surrounding soft
tissue inflammation seen. This is suggested to be contiguous with
the adjacent mid ileum. Given its appearance and the location, this
is most suspicious for aneurysmal bowel dilatation due to small
bowel lymphoma. Jejunal adenocarcinoma can mimic this appearance,
and metastatic disease as might be seen with melanoma can also have
such an appearance, though there are no additional findings to
suggest melanoma.
3. There is recruitment of vasculature about the mass, mild
nodularity overlying the adjacent descending colon, and a few
prominent associated mesenteric nodes. These findings are highly
suggestive of malignancy.
4. Incidental note made of underlying small bowel malrotation, as
the duodenum does not pass across the midline. However, the cecum
and appendix are still seen on the right side.
5. Minimal nodularity at the lung apices likely reflects
atelectasis, though minimal infection might have a similar
appearance. Minimal bibasilar atelectasis noted.
6. Suggestion of mild distal esophageal wall thickening, which could
reflect minimal esophagitis, or could remain within normal limits.
7. Circumaortic left renal vein incidentally noted.
8. Somewhat heterogeneous appearance to hepatic enhancement. This
may reflect underlying fatty infiltration or mild venous congestion.
No focal mass seen.
9. Small right renal cyst noted.
10. Trace free fluid within the pelvis may be physiologic in nature,
or could be related to the left lower quadrant abdominal mass.
11. Heterogeneous appearance to the uterus may reflect multiple tiny
fibroids.

These results were called by telephone at the time of interpretation
on 06/07/2014 at [DATE] to Dr. YOGENDRANATH NAGEN, who verbally
acknowledged these results.

## 2016-03-04 IMAGING — DX DG CHEST 1V PORT
1 series · 1 of 1 positions shown · non-contrast
Comparison: CT chest 06/07/2014

CLINICAL DATA: Shortness of breath after surgery

EXAM:
PORTABLE CHEST - 1 VIEW

[chest ap]
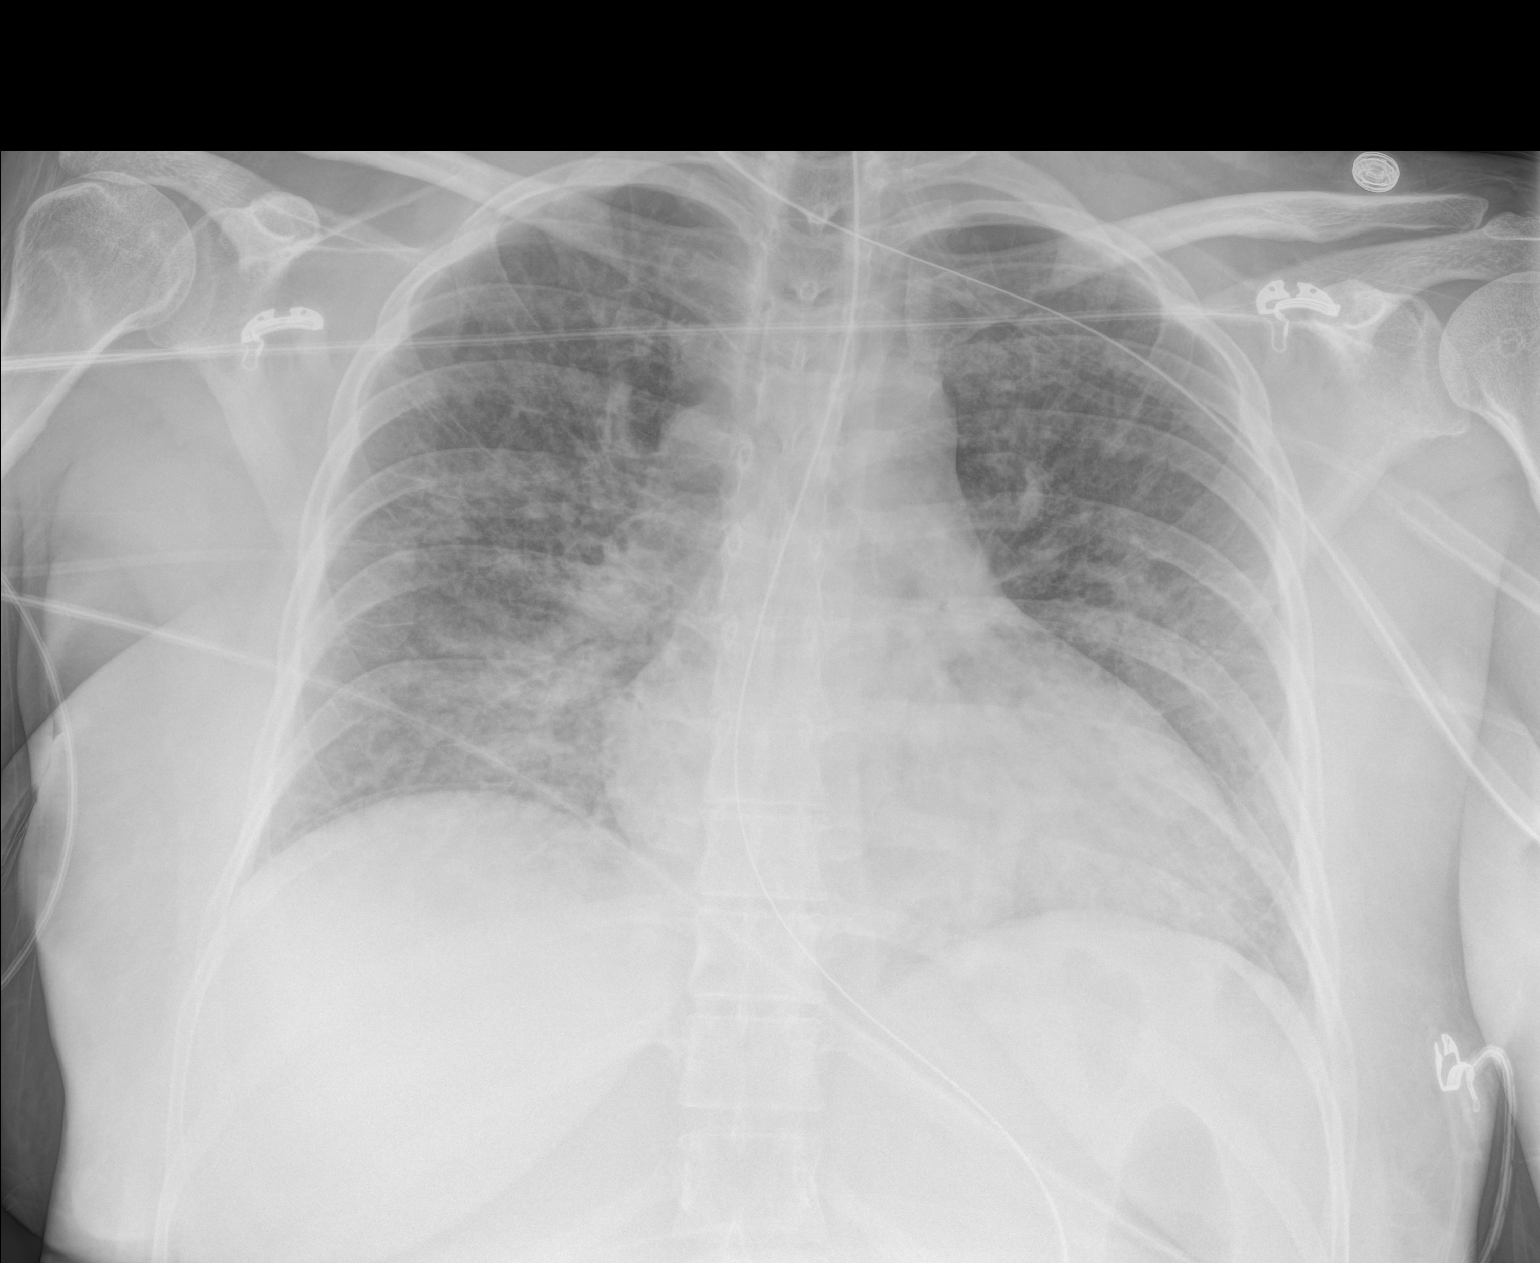

[1 of 1 positions shown; findings below may reference images not displayed]

FINDINGS: Bilateral interstitial and alveolar airspace opacities. No pleural
effusion or pneumothorax. Stable cardiomediastinal silhouette.
Nasogastric tube coursing below the diaphragm.

The osseous structures are unremarkable.
IMPRESSION: Bilateral interstitial and alveolar airspace opacities concerning
for mild pulmonary edema.

## 2016-03-07 IMAGING — CR DG CHEST 1V PORT
1 series · 1 of 1 positions shown · non-contrast
Comparison: 06/07/2014

CLINICAL DATA: Respiratory failure

EXAM:
PORTABLE CHEST - 1 VIEW

[AP]
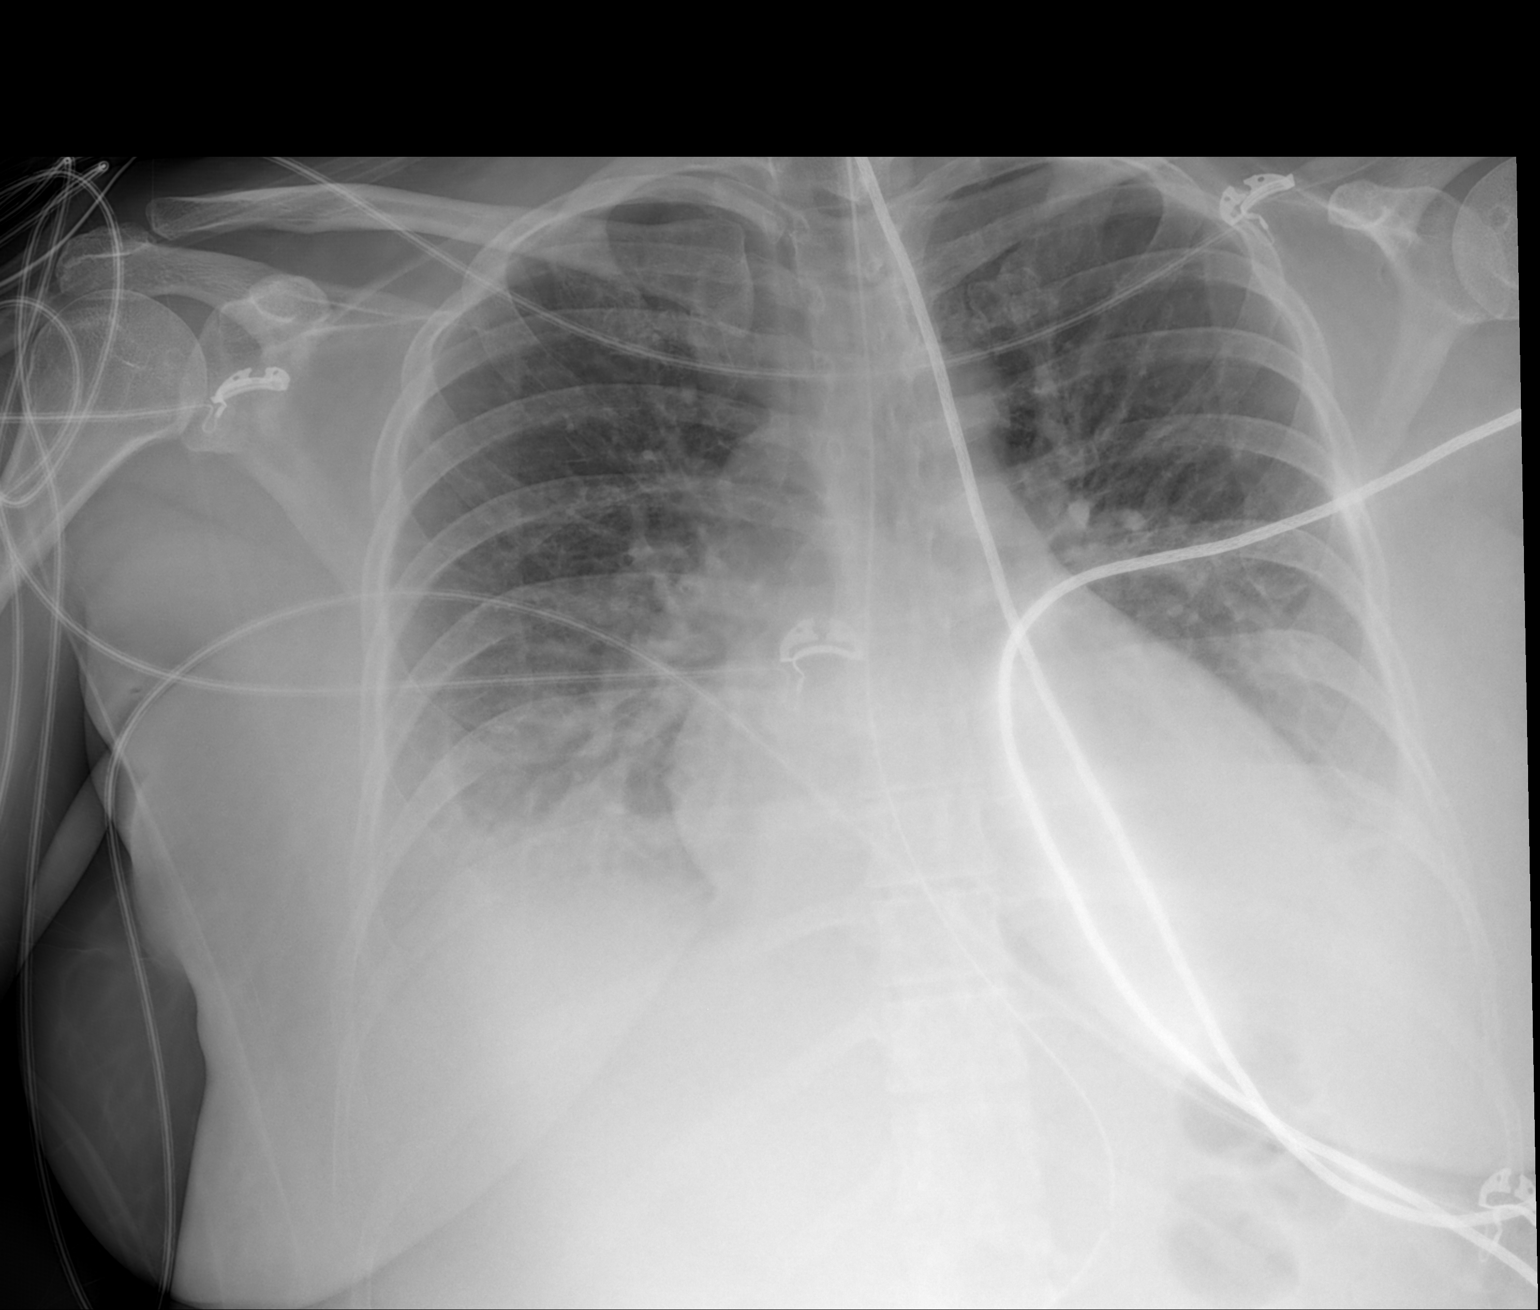

[1 of 1 positions shown; findings below may reference images not displayed]

FINDINGS: Nasogastric tube extends into the stomach. There is partial
clearance of airspace opacities with residual basilar opacities and
probable small pleural effusions. Moderate cardiomegaly, unchanged.
IMPRESSION: Partial clearance of the central and upper airspace opacities with
residual basilar consolidation bilaterally.

## 2016-03-08 IMAGING — DX DG CHEST 1V PORT
1 series · 1 of 1 positions shown · non-contrast
Comparison: 06/10/2014

CLINICAL DATA: Pulmonary edema

EXAM:
PORTABLE CHEST - 1 VIEW

[chest ap]
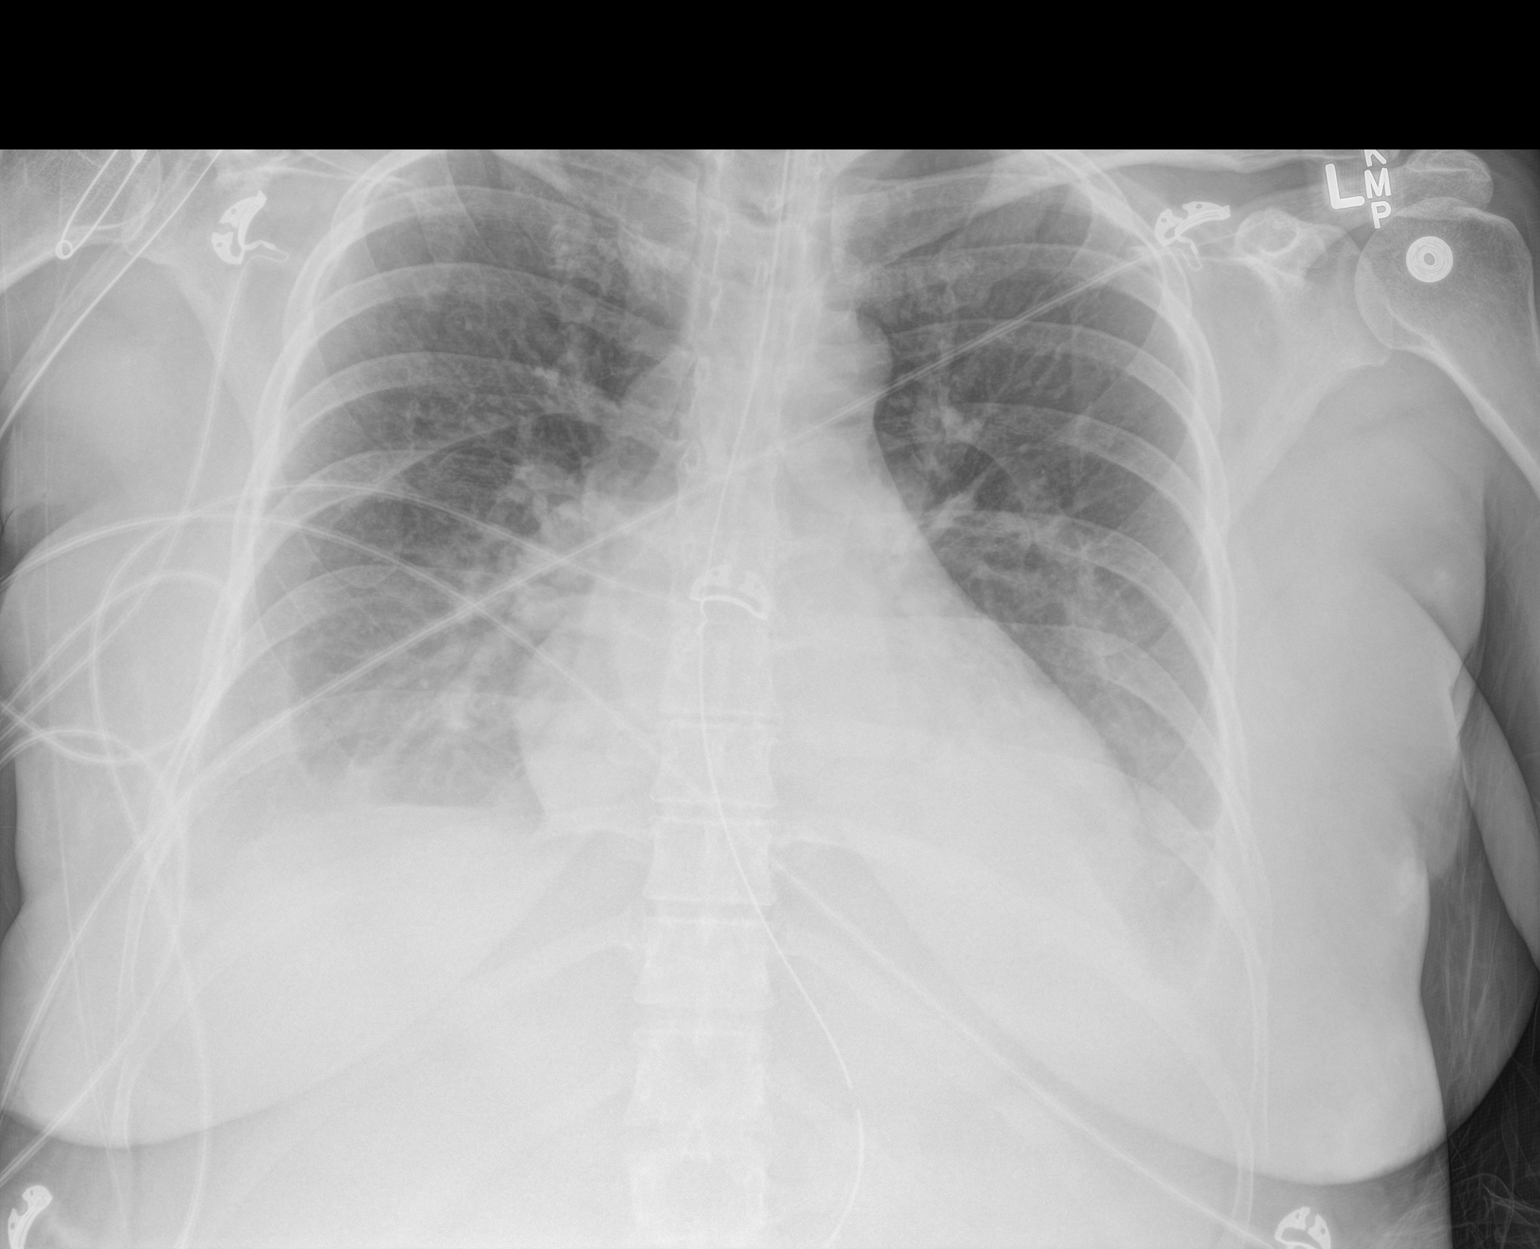

[1 of 1 positions shown; findings below may reference images not displayed]

FINDINGS: Nasogastric tube extends into the stomach. Basilar airspace
consolidation persists, left greater than right, with slight
improvement. There is a somewhat better aeration in the bases,
particularly on the right. There is a small left pleural effusion
which is unchanged and a very small right effusion.
IMPRESSION: Slight improvement with better aeration in the bases although there
is persistent airspace consolidation and pleural effusions.

## 2016-03-10 ENCOUNTER — Other Ambulatory Visit (HOSPITAL_BASED_OUTPATIENT_CLINIC_OR_DEPARTMENT_OTHER): Payer: 59

## 2016-03-10 ENCOUNTER — Encounter (HOSPITAL_COMMUNITY): Payer: Self-pay

## 2016-03-10 ENCOUNTER — Ambulatory Visit (HOSPITAL_COMMUNITY)
Admission: RE | Admit: 2016-03-10 | Discharge: 2016-03-10 | Disposition: A | Discharge status: Home / Self Care | Payer: 59 | Source: Ambulatory Visit | Attending: Hematology | Admitting: Hematology

## 2016-03-10 DIAGNOSIS — C49A3 Gastrointestinal stromal tumor of small intestine: Secondary | ICD-10-CM | POA: Insufficient documentation

## 2016-03-10 LAB — CBC & DIFF AND RETIC
BASO%: 0.3 % (ref 0.0–2.0)
BASOS ABS: 0 10*3/uL (ref 0.0–0.1)
EOS%: 1.9 % (ref 0.0–7.0)
Eosinophils Absolute: 0.2 10*3/uL (ref 0.0–0.5)
HEMATOCRIT: 38.2 % (ref 34.8–46.6)
HGB: 12.8 g/dL (ref 11.6–15.9)
IMMATURE RETIC FRACT: 6.8 % (ref 1.60–10.00)
LYMPH%: 34.6 % (ref 14.0–49.7)
MCH: 31.1 pg (ref 25.1–34.0)
MCHC: 33.5 g/dL (ref 31.5–36.0)
MCV: 92.7 fL (ref 79.5–101.0)
MONO#: 0.5 10*3/uL (ref 0.1–0.9)
MONO%: 5.8 % (ref 0.0–14.0)
NEUT#: 4.5 10*3/uL (ref 1.5–6.5)
NEUT%: 57.4 % (ref 38.4–76.8)
PLATELETS: 230 10*3/uL (ref 145–400)
RBC: 4.12 10*6/uL (ref 3.70–5.45)
RDW: 13.3 % (ref 11.2–14.5)
Retic %: 1.34 % (ref 0.70–2.10)
Retic Ct Abs: 55.21 10*3/uL (ref 33.70–90.70)
WBC: 7.8 10*3/uL (ref 3.9–10.3)
lymph#: 2.7 10*3/uL (ref 0.9–3.3)

## 2016-03-10 LAB — COMPREHENSIVE METABOLIC PANEL
ALK PHOS: 67 U/L (ref 40–150)
ALT: 21 U/L (ref 0–55)
ANION GAP: 8 meq/L (ref 3–11)
AST: 20 U/L (ref 5–34)
Albumin: 3.9 g/dL (ref 3.5–5.0)
BILIRUBIN TOTAL: 0.75 mg/dL (ref 0.20–1.20)
BUN: 11.7 mg/dL (ref 7.0–26.0)
CALCIUM: 9.4 mg/dL (ref 8.4–10.4)
CO2: 24 meq/L (ref 22–29)
Chloride: 106 mEq/L (ref 98–109)
Creatinine: 0.9 mg/dL (ref 0.6–1.1)
EGFR: 73 mL/min/{1.73_m2} — AB (ref >90)
Glucose: 85 mg/dl (ref 70–140)
Potassium: 4.1 mEq/L (ref 3.5–5.1)
Sodium: 138 mEq/L (ref 136–145)
TOTAL PROTEIN: 7 g/dL (ref 6.4–8.3)

## 2016-03-10 MED ORDER — IOPAMIDOL (ISOVUE-300) INJECTION 61%
100.0000 mL | Freq: Once | INTRAVENOUS | Status: DC | PRN
  Administered 2016-03-10: 100 mL via INTRAVENOUS
  Filled 2016-03-10: qty 100

## 2016-03-10 MED ORDER — IOPAMIDOL (ISOVUE-370) INJECTION 76%
INTRAVENOUS | Status: AC
  Filled 2016-03-10: qty 100

## 2016-03-18 ENCOUNTER — Ambulatory Visit (HOSPITAL_BASED_OUTPATIENT_CLINIC_OR_DEPARTMENT_OTHER): Payer: 59 | Admitting: Hematology

## 2016-03-18 ENCOUNTER — Encounter: Payer: Self-pay | Admitting: Hematology

## 2016-03-18 VITALS — BP 109/63 | HR 63 | Temp 98.4°F | Resp 18 | Ht 63.0 in | Wt 171.5 lb

## 2016-03-18 DIAGNOSIS — C49A3 Gastrointestinal stromal tumor of small intestine: Secondary | ICD-10-CM | POA: Diagnosis not present

## 2016-03-18 DIAGNOSIS — R918 Other nonspecific abnormal finding of lung field: Secondary | ICD-10-CM

## 2016-03-18 DIAGNOSIS — F419 Anxiety disorder, unspecified: Secondary | ICD-10-CM

## 2016-03-18 NOTE — Progress Notes (Signed)
[place]  [phone] Fax:([phone]  Clinic Follow up Note   Patient Care Team: Kathyrn Lass, MD as PCP - General (Family Medicine) Earnstine Regal, PA-C as Physician Assistant (Obstetrics and Gynecology) Excell Seltzer, MD as Consulting Physician (General Surgery) Truitt Merle, MD as Consulting Physician ([place]) Tania Ade, RN as Registered Nurse (Medical Oncology) Earnstine Regal, PA-C as Physician Assistant (Obstetrics and Gynecology) 03/18/2016  CHIEF COMPLAINTS:  Follow up GIST   Oncology History   GIST (gastrointestinal stromal tumor) of jejunum with perforation, s/p SB resection 06/07/2014   Staging form: Small Intestine, AJCC 7th Edition     Pathologic stage from 07/03/2014: Stage Unknown (T4, NX, cM0) - Unsigned       GIST (gastrointestinal stromal tumor) of jejunum with perforation, s/p SB resection 06/07/2014   06/07/2014 Initial Diagnosis    GIST (gastrointestinal stromal tumor) of jejunum with perforation, s/p SB resection 06/07/2014      06/07/2014 Imaging    9.3 X 8.4 X 7.5 cm mass left lower quadrant. Recruitment of vasculature about the mass, mild nodularity overlying the adjacent descending colon.      06/07/2014 Pathologic Stage    pT4,pNX / GI:low grade; mitotic rate 5/50      06/07/2014 Surgery    Exploratory laparotomy resection of abdominal mass and portion of small bowel      06/07/2014 Miscellaneous    tumor was tested for c-kit gene mutation, (+) p.W409 deletion/insertion NP. This predicts good response to Tyrosine kinase inhibitor.      07/18/2014 -  Chemotherapy    Gleevec 400 mg po       HISTORY OF PRESENTING ILLNESS (08/15/2014):  Miranda Howe 45 y.o. female is here because of recently diagnosed GIST.   She had scatter intermittent low mid and RLQ abdominal pain and nausea and vomiting since early this year and some weight loss 15 lbs (some intensional), she was initially developed with her  gynecologist and workup was negative except a cervical polyps. She presented to [facility] ED on March15 2016 with sudden onset of severe abdominal pain, shaking chills. CT scan of the abdomen showed a large intra-abdominal mass, she was treated with IV hydration and antibiotics for septic shock. She underwent urgent exploratory laparotomy and resection of the small bowel mass on 06/07/2014 by Dr. Excell Seltzer. According to the operation note, the mass is located in the proximal jejunum, and there was an obvious opening on the and 10 mesenteric site of jejunum into the mass, possibly a diverticulum or perforating into the mass. There is no free fluid in the abdomen. She tolerated the procedure well and no was subsequently discharged home on 06/15/2014.  She has been recovering well from the surgery, off pain meds for 5-6 days, more physically active now, she has good appetite and eating well, BM is normal, no blood in stool.  she is able to do some light housework and activities, but has not been back to her for activities. She is planning to return to work on 08/03/2014. 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   CURRENT THERAPY: Gleevec '400mg'$  dialy   INTERIM HISTORY:  Miranda Howe returns for follow-up. She is doing well overall. She has occasional acid reflux symptoms, she has been taking Pepcid on daily basis. She did have an episode of vomiting at the 3:00 this morning, she feels his rate to the deep fried food she ate last night, and she took Pepcid too late last night. She feels  fine today. She denies any pain, nausea, or other symptoms. She has been tolerating Gleevec well, no significant side effects.  SURGICAL HISTORY: Past Surgical History:  Procedure Laterality Date  . BARTHOLIN GLAND CYST EXCISION  01/17/2009   I&D  . CERVICAL POLYPECTOMY  2010  . LAPAROTOMY N/A 06/07/2014   Procedure: EXPLORATORY LAPAROTOMY RESECTION OF ABDOMINAL MASS AND PORTION OF SMALL BOWEL;  Surgeon: Excell Seltzer, MD;  Location: [facility];  Service: General;  Laterality: N/A;  . WISDOM TOOTH EXTRACTION      SOCIAL HISTORY: History   Social History  . Marital Status: Married    Spouse Name: N/A  . Number of Children: 2 children, age of 31 and 62   . Years of Education: N/A   Occupational History  . Not on file.   Social History Main Topics  . Smoking status: Never Smoker   . Smokeless tobacco: Never Used  . Alcohol Use: Yes     Comment: socially  . Drug Use: No  . Sexual Activity: Yes    Birth Control/ Protection: Condom, Other-see comments     Comment: pt use gel    Other Topics Concern  . 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 Heart disease Father   . Hypertension Father   . Cancer Father 57    colon cancer; currently 58  . Other Mother     varicose veins  . Cancer Mother 62    breast cancer; currently 80  . Hypertension Mother   . Diabetes Mother   . Lupus Cousin   . Cancer Paternal Grandfather     colon cancer (age?); deceased  . Cancer Maternal Aunt     uterine cancer ~67; deceased 64s  . Cancer Paternal Uncle     pancreatic; deceased 22s  . Cancer Paternal Uncle     colon cancer 32s; currently 26  . Cancer Paternal Uncle     colon cancer 88s; currently 6s    ALLERGIES:  is allergic to sulfa antibiotics.  MEDICATIONS:  Current Outpatient Prescriptions  Medication Sig Dispense Refill  . acetaminophen (TYLENOL) 325 MG tablet Take 2 tablets (650 mg total) by mouth every 6 (six) hours as needed for mild pain, moderate pain, fever or headache.    . citalopram (CELEXA) 10 MG tablet Take 10 mg by mouth daily.    Marland Kitchen GLEEVEC 400 MG tablet TAKE 1 TABLET BY MOUTH ONCE DAILY WITH MEALS AND A LARGE GLASS OF WATER (CAUTION:CHEMOTHERAPY) 90 tablet 2  . ibuprofen (ADVIL,MOTRIN) 200 MG tablet Take 400 mg by mouth every  6 (six) hours as needed for moderate pain.    . Multiple Vitamin (MULTIVITAMIN) tablet Take 1 tablet by mouth daily.    Marland Kitchen oxyCODONE (OXY IR/ROXICODONE) 5 MG immediate release tablet Take 1-2 tablets (5-10 mg total) by mouth every 4 (four) hours as needed for moderate pain. (Patient not taking: Reported on 03/18/2016) 50 tablet 0   No current facility-administered medications for this visit.     REVIEW OF SYSTEMS:   Constitutional: Denies fevers, chills or abnormal night sweats.  Eyes: Denies blurriness of vision, double vision or watery eyes Ears, nose, mouth, throat, and face: Denies mucositis or sore throat Respiratory: Denies cough, dyspnea or wheezes Cardiovascular: Denies palpitation, chest discomfort or lower extremity swelling Gastrointestinal:  Denies  nausea, heartburn or change in bowel habits Skin: Denies abnormal skin rashes Lymphatics: Denies new lymphadenopathy or easy bruising Neurological:Denies numbness, tingling or new weaknesses Behavioral/Psych: Mood is stable, no new changes  All other systems were reviewed with the patient and are negative.  PHYSICAL EXAMINATION: ECOG PERFORMANCE STATUS: 0  Vitals:   03/18/16 1315  BP: 109/63  Pulse: 63  Resp: 18  Temp: 98.4 F (36.9 C)   Filed Weights   03/18/16 1315  Weight: 171 lb 8 oz (77.8 kg)    GENERAL:alert, no distress and comfortable SKIN: skin color, texture, turgor are normal, no rashes or significant lesions EYES: normal, conjunctiva are pink and non-injected, sclera clear OROPHARYNX:no exudate, no erythema and lips, buccal mucosa, and tongue normal  NECK: supple, thyroid normal size, non-tender, without nodularity LYMPH:  no palpable lymphadenopathy in the cervical, axillary or inguinal LUNGS: clear to auscultation and percussion with normal breathing effort HEART: regular rate & rhythm and no murmurs and no lower extremity edema ABDOMEN:abdomen soft, non-tender and normal bowel sounds. Midline surgical  scar is well-healed.  Musculoskeletal:no cyanosis of digits and no clubbing  PSYCH: alert & oriented x 3 with fluent speech NEURO: no focal motor/sensory deficits  LABORATORY DATA:  I have reviewed the data as listed CBC Latest Ref Rng & Units 03/10/2016 12/11/2015 07/16/2015  WBC 3.9 - 10.3 10e3/uL 7.8 7.6 7.3  Hemoglobin 11.6 - 15.9 g/dL 12.8 12.4 12.2  Hematocrit 34.8 - 46.6 % 38.2 36.1 35.8  Platelets 145 - 400 10e3/uL 230 272 228     CMP Latest Ref Rng & Units 03/10/2016 12/11/2015 07/16/2015  Glucose 70 - 140 mg/dl 85 92 84  BUN 7.0 - 26.0 mg/dL 11.7 14.8 17.2  Creatinine 0.6 - 1.1 mg/dL 0.9 1.1 1.0  Sodium 136 - 145 mEq/L 138 141 141  Potassium 3.5 - 5.1 mEq/L 4.1 4.6 4.3  Chloride 96 - 112 mmol/L - - -  CO2 22 - 29 mEq/L '24 26 27  '$ Calcium 8.4 - 10.4 mg/dL 9.4 9.7 9.8  Total Protein 6.4 - 8.3 g/dL 7.0 7.0 7.0  Total Bilirubin 0.20 - 1.20 mg/dL 0.75 0.39 0.43  Alkaline Phos 40 - 150 U/L 67 85 65  AST 5 - 34 U/L '20 20 22  '$ ALT 0 - 55 U/L '21 20 17     '$ PATHOLOGY REPORT Diagnosis 06/07/2014 Small intestine, resection for tumor, Abdominal mass - GASTROINTESTINAL STROMAL TUMOR, 10.2 CM WITH CENTRAL NECROTIC CAVITY. - SEE ONCOLOGY TABLE. Microscopic Comment GASTROINTESTINAL STROMAL TUMOR (GIST): Procedure: Resection. Tumor Site: Small bowel. Tumor Size: 10.2 cm. Tumor Focality: Unifocal. GIST Subtype: Spindle cell. Mitotic Rate: 2/50 HIGH POWER FIELD Necrosis: Present, central. Histologic Grade: G1: Low grade; mitotic rate 5/50 HIGH POWER FIELD. Risk Assessment: High risk. Margins: Free of tumor. Distance of tumor from closest margin: Less than 0.2 cm. Ancillary testing: Immunohistochemistry. Lymph nodes: number examined: 0; number positive: N/A Pathologic Staging: pT4, pNX Comments: The tumor is composed of spindle cells with minimal to mild cytologic atypia and rare mitotic figures (2 per 50 high power field). Much of the central portion of the tumor is cavitary with  associated necrosis and the overlying small bowel mucosa is ulcerated and communicates with the central cavity within the tumor. The tumor extends to the subserosal connective tissue and is focally adjacent to the visceral peritoneum. Immunohistochemistry shows strong positivity with CD117 and smooth muscle actin and negative staining with S100, desmin and muscle specific actin. (JDP:ecj 06/09/2014)    RADIOGRAPHIC STUDIES: I  have personally reviewed the radiological images as listed and agreed with the findings in the report.   CT abdomen and pelvis w contrast 03/10/2016 IMPRESSION: 1. Stable. No new or progressive findings to suggest recurrent or metastatic disease.  Ct chest wo contrast 11/06/2015 IMPRESSION: 1. No significant change in tiny bilateral pulmonary nodules. 2.  No acute process or evidence of metastatic disease in the chest.   ASSESSMENT & PLAN:  45 y.o. Caucasian female without significant past medical history except anxiety who was found to have a 10 cm proximal jejunal mass, with perforation.   1. 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 -Giving the size of her tumor, this is stage III. Although it's low grade, giving the large size and perforation, this is consider a high risk tumor, the risk of recurrence is about 50%.   -The nature of the disease was reviewed with patient and her husband in great details, patient had many questions and I answered to her satisfaction. -I recommend adjuvant Gleevec for 3 years to reduce the risk of recurrence in the future. -She tolerating Gleevec well, without significant side effects. We'll continue -continue surveillance. Per NCCN, patient will be seen and exam every 3-6 months for 5 years, then annually. CT scan will be obtained every 6 months for 5  years, then annually. -I reviewed her surveillance CT abdomen and pelvis from 03/10/2016, which showed no evidence of recurrence. She has stable right kidney cyst, and tiny hypodense liver lesions, likely benign.  -She is clinically doing very well, her physical exam today is normal, lab results were reviewed with her, no clinical suspicion for recurrence. We'll continue surveillance.  2. Anxiety -Continue Celexa.  3. Right lung nodules -she was found to have to 4 mm small nodules in the right lung on CT scan 07/16/2015. -She never smoked, risk of lung cancer is low -Her follow-up CT scan in August 2017showed stable tiny lung nodules, likely benign. -may repeat CT chest in June 2018    Plan -continue [place] -Return to clinic in 3 months with lab, plan to repeat CT chest, abdomen and pelvis with contrast in 6 months.  Patient had a several questions about her CT scan findings, all questions were answered to her satisfaction. The patient knows to call the clinic with any problems, questions or concerns.  I spent 20 minutes counseling the patient face to face. The total time spent in the appointment was 25 minutes and more than 50% was on counseling.     Truitt Merle, MD 03/18/2016 6:11 PM

## 2016-03-27 ENCOUNTER — Telehealth: Payer: Self-pay | Admitting: *Deleted

## 2016-03-27 ENCOUNTER — Ambulatory Visit (HOSPITAL_COMMUNITY)
Admission: EM | Admit: 2016-03-27 | Discharge: 2016-03-27 | Disposition: A | Discharge status: Home / Self Care | Payer: 59 | Attending: Emergency Medicine | Admitting: Emergency Medicine

## 2016-03-27 ENCOUNTER — Ambulatory Visit (INDEPENDENT_AMBULATORY_CARE_PROVIDER_SITE_OTHER): Payer: 59

## 2016-03-27 ENCOUNTER — Encounter (HOSPITAL_COMMUNITY): Payer: Self-pay | Admitting: Family Medicine

## 2016-03-27 DIAGNOSIS — R51 Headache: Secondary | ICD-10-CM | POA: Insufficient documentation

## 2016-03-27 DIAGNOSIS — J0100 Acute maxillary sinusitis, unspecified: Secondary | ICD-10-CM | POA: Diagnosis not present

## 2016-03-27 DIAGNOSIS — R05 Cough: Secondary | ICD-10-CM | POA: Insufficient documentation

## 2016-03-27 DIAGNOSIS — Z79899 Other long term (current) drug therapy: Secondary | ICD-10-CM | POA: Insufficient documentation

## 2016-03-27 LAB — CBC WITH DIFFERENTIAL/PLATELET
BASOS ABS: 0 10*3/uL (ref 0.0–0.1)
Basophils Relative: 0 %
EOS PCT: 0 %
Eosinophils Absolute: 0 10*3/uL (ref 0.0–0.7)
HCT: 36.7 % (ref 36.0–46.0)
Hemoglobin: 12.5 g/dL (ref 12.0–15.0)
LYMPHS PCT: 20 %
Lymphs Abs: 1.6 10*3/uL (ref 0.7–4.0)
MCH: 31.5 pg (ref 26.0–34.0)
MCHC: 34.1 g/dL (ref 30.0–36.0)
MCV: 92.4 fL (ref 78.0–100.0)
MONO ABS: 0.6 10*3/uL (ref 0.1–1.0)
Monocytes Relative: 7 %
Neutro Abs: 5.7 10*3/uL (ref 1.7–7.7)
Neutrophils Relative %: 73 %
PLATELETS: 212 10*3/uL (ref 150–400)
RBC: 3.97 MIL/uL (ref 3.87–5.11)
RDW: 13.5 % (ref 11.5–15.5)
WBC: 7.9 10*3/uL (ref 4.0–10.5)

## 2016-03-27 LAB — POCT I-STAT, CHEM 8
BUN: 19 mg/dL (ref 6–20)
CALCIUM ION: 1.15 mmol/L (ref 1.15–1.40)
Chloride: 104 mmol/L (ref 101–111)
Creatinine, Ser: 1.2 mg/dL — ABNORMAL HIGH (ref 0.44–1.00)
GLUCOSE: 121 mg/dL — AB (ref 65–99)
HCT: 39 % (ref 36.0–46.0)
HEMOGLOBIN: 13.3 g/dL (ref 12.0–15.0)
POTASSIUM: 3.6 mmol/L (ref 3.5–5.1)
Sodium: 139 mmol/L (ref 135–145)
TCO2: 24 mmol/L (ref 0–100)

## 2016-03-27 MED ORDER — AMOXICILLIN-POT CLAVULANATE 875-125 MG PO TABS: 1.0000 | ORAL_TABLET | Freq: Two times a day (BID) | ORAL | 0 refills | Status: DC

## 2016-03-27 NOTE — ED Triage Notes (Signed)
Pt here for URI symptoms with facial pain and nasal congestion.

## 2016-03-27 NOTE — Telephone Encounter (Signed)
I advise her to go to urgent care, or PCP or see our [facility] tomorrow.   Truitt Merle MD

## 2016-03-27 NOTE — Telephone Encounter (Signed)
Currently reads admitted on the Patient Station to Urgent Care.

## 2016-03-27 NOTE — Discharge Instructions (Signed)
You have a sinus infection. Your white count is normal. Take Augmentin twice a day for 10 days. Use nasal saline spray or a Netti Pot several times a day. Continue Advil sinus as needed. You should see improvement in 2-3 days. Follow-up as needed.

## 2016-03-27 NOTE — Telephone Encounter (Signed)
"  This is Merry Proud calling for my wife Miranda Howe.  She has been on Gleevec for seventeen months now.  Last night she had a fever of 103.00 with chills and coughing yellow phlegm now with streaks of blood.  She thinks it's a sinus infection or bronchitis.  Taking motrin and temperature has come down to 100.0.  She's weak, sweating in bed but I'm lifting her and need to know where to take her.  She's not eating or drinking a lot.  We went to [place] where she was exposed to cold temperatures three to four days ago is when the symptoms started."   Advised to go to the ED.  She's not going to want to go there.  [facility] off [address] is a non-urgent alternative.  Temp is lower due to Motrin but advised she needs further evaluation last night's report of 103 and chills.    "Will you let Dr. Burr Medico know."   Will notify Dr. Burr Medico of this

## 2016-03-27 NOTE — ED Provider Notes (Signed)
[place]    CSN: [id number] Arrival date & time: 03/27/16  1614     History   Chief Complaint Chief Complaint  Patient presents with  . Facial Pain  . Cough    HPI Miranda Howe is a 46 y.o. female.   HPI  She is a 46 year old woman here for evaluation of sinus pressure. She states she started with congestion and runny nose about a week ago. This is associated with a cough and chest congestion. Yesterday, things got a little bit worse with increasing sinus pain and pressure, fever of 103, and one episode of vomiting. She denies wheezing or shortness of breath. She describes feeling a lot of chest congestion. No weakness or dizziness.  She is on [place] for GIST.  She was seen by her oncologist on December 18. Blood work at that time showed a normal white cell count, neutrophil count, and reticulocyte count.  Past Medical History:  Diagnosis Date  . Anxiety   . Family history of colon cancer     Patient Active Problem List   Diagnosis Date Noted  . Genetic testing 08/08/2014  . Family history of colon cancer   . GIST (gastrointestinal stromal tumor) of jejunum with perforation, s/p SB resection 06/07/2014 06/13/2014  . Hypothyroidism 06/13/2014  . Bradycardia with 31 - 40 beats per minute 06/13/2014  . Atelectasis   . Blood poisoning ([place])   . Hypotension 06/08/2014  . Anxiety 06/07/2014    Past Surgical History:  Procedure Laterality Date  . BARTHOLIN GLAND CYST EXCISION  01/17/2009   I&D  . CERVICAL POLYPECTOMY  2010  . LAPAROTOMY N/A 06/07/2014   Procedure: EXPLORATORY LAPAROTOMY RESECTION OF ABDOMINAL MASS AND PORTION OF SMALL BOWEL;  Surgeon: Excell Seltzer, MD;  Location: [facility];  Service: General;  Laterality: N/A;  . WISDOM TOOTH EXTRACTION      OB History    Gravida Para Term Preterm AB Living   3 2     1 2    SAB TAB Ectopic Multiple Live Births   1               Home Medications    Prior to Admission medications     Medication Sig Start Date End Date Taking? Authorizing Provider  acetaminophen (TYLENOL) 325 MG tablet Take 2 tablets (650 mg total) by mouth every 6 (six) hours as needed for mild pain, moderate pain, fever or headache. 06/15/14   Erby Pian, NP  amoxicillin-clavulanate (AUGMENTIN) 875-125 MG tablet Take 1 tablet by mouth 2 (two) times daily. 03/27/16   Melony Overly, MD  citalopram (CELEXA) 10 MG tablet Take 10 mg by mouth daily.    Historical Provider, MD  GLEEVEC 400 MG tablet TAKE 1 TABLET BY MOUTH ONCE DAILY WITH MEALS AND A LARGE GLASS OF WATER (CAUTION:CHEMOTHERAPY) 10/12/15   Truitt Merle, MD  ibuprofen (ADVIL,MOTRIN) 200 MG tablet Take 400 mg by mouth every 6 (six) hours as needed for moderate pain.    Historical Provider, MD  Multiple Vitamin (MULTIVITAMIN) tablet Take 1 tablet by mouth daily.    Historical Provider, MD  oxyCODONE (OXY IR/ROXICODONE) 5 MG immediate release tablet Take 1-2 tablets (5-10 mg total) by mouth every 4 (four) hours as needed for moderate pain. Patient not taking: Reported on 03/18/2016 06/15/14   Erby Pian, NP    Family History Family History  Problem Relation Age of Onset  . Heart disease Father   . Hypertension Father   . Cancer Father  70    colon cancer; currently 106  . Other Mother     varicose veins  . Cancer Mother 33    breast cancer; currently 61  . Hypertension Mother   . Diabetes Mother   . Lupus Cousin   . Cancer Paternal Grandfather     colon cancer (age?); deceased  . Cancer Maternal Aunt     uterine cancer ~67; deceased 64s  . Cancer Paternal Uncle     pancreatic; deceased 67s  . Cancer Paternal Uncle     colon cancer 61s; currently 71  . Cancer Paternal Uncle     colon cancer 27s; currently 45s    Social History Social History  Substance Use Topics  . Smoking status: Never Smoker  . Smokeless tobacco: Never Used  . Alcohol use Yes     Comment: socially     Allergies   Sulfa antibiotics   Review of Systems Review  of Systems As in history of present illness  Physical Exam Triage Vital Signs ED Triage Vitals  Enc Vitals Group     BP 03/27/16 1625 118/63     Pulse Rate 03/27/16 1625 81     Resp 03/27/16 1625 14     Temp 03/27/16 1625 98.5 F (36.9 C)     Temp Source 03/27/16 1625 Oral     SpO2 03/27/16 1625 100 %     Weight --      Height --      Head Circumference --      Peak Flow --      Pain Score 03/27/16 1632 3     Pain Loc --      Pain Edu? --      Excl. in [place]? --    No data found.   Updated Vital Signs BP 118/63 (BP Location: Right Arm)   Pulse 81   Temp 98.5 F (36.9 C) (Oral)   Resp 14   LMP 03/19/2016   SpO2 100%   Visual Acuity Right Eye Distance:   Left Eye Distance:   Bilateral Distance:    Right Eye Near:   Left Eye Near:    Bilateral Near:     Physical Exam  Constitutional: She is oriented to person, place, and time. She appears well-developed and well-nourished. No distress.  HENT:  Mouth/Throat: No oropharyngeal exudate.  Oropharynx clear. Nasal mucosa mildly erythematous. There is some discharge seen. Bilateral maxillary sinus tenderness. Bilateral ear effusions.  Neck: Neck supple.  Cardiovascular: Normal rate and regular rhythm.   No murmur heard. Pulmonary/Chest: Effort normal and breath sounds normal. No respiratory distress. She has no wheezes. She has no rales.  Lymphadenopathy:    She has no cervical adenopathy.  Neurological: She is alert and oriented to person, place, and time.     UC Treatments / Results  Labs (all labs ordered are listed, but only abnormal results are displayed) Labs Reviewed  POCT I-STAT, CHEM 8 - Abnormal; Notable for the following:       Result Value   Creatinine, Ser 1.20 (*)    Glucose, Bld 121 (*)    All other components within normal limits  CBC WITH DIFFERENTIAL/PLATELET    EKG  EKG Interpretation None       Radiology Dg Chest 2 View  Result Date: 03/27/2016 CLINICAL DATA:  46 year old female  with a history of cough EXAM: CHEST  2 VIEW COMPARISON:  CT 11/06/2015, chest x-ray 06/11/2014 FINDINGS: Cardiomediastinal silhouette unchanged. No pneumothorax or pleural  effusion.  No confluent airspace disease. No displaced fracture. Mild degenerative changes of the visualized thoracic spine. IMPRESSION: No radiographic evidence of acute cardiopulmonary disease Signed, Dulcy Fanny. Earleen Newport, DO [facility] Radiology Electronically Signed   By: Corrie Mckusick D.O.   On: 03/27/2016 17:11    Procedures Procedures (including critical care time)  Medications Ordered in UC Medications - No data to display   Initial Impression / Assessment and Plan / UC Course  I have reviewed the triage vital signs and the nursing notes.  Pertinent labs & imaging results that were available during my care of the patient were reviewed by me and considered in my medical decision making (see chart for details).  Clinical Course     CBC shows normal white count and neutrophil count. I-STAT with a creatinine of 1.2, but baseline appears to be 0.9-1.1. Treat with Augmentin for 10 days. Recommended nasal saline spray and continued Advil sinus as needed. Follow-up as needed.  Final Clinical Impressions(s) / UC Diagnoses   Final diagnoses:  Acute non-recurrent maxillary sinusitis    New Prescriptions New Prescriptions   AMOXICILLIN-CLAVULANATE (AUGMENTIN) 875-125 MG TABLET    Take 1 tablet by mouth 2 (two) times daily.     Melony Overly, MD 03/27/16 [phone]

## 2016-05-14 DIAGNOSIS — H52221 Regular astigmatism, right eye: Secondary | ICD-10-CM | POA: Diagnosis not present

## 2016-05-14 DIAGNOSIS — H524 Presbyopia: Secondary | ICD-10-CM | POA: Diagnosis not present

## 2016-05-14 DIAGNOSIS — H5213 Myopia, bilateral: Secondary | ICD-10-CM | POA: Diagnosis not present

## 2016-05-23 ENCOUNTER — Telehealth: Payer: Self-pay | Admitting: Pharmacist

## 2016-05-23 DIAGNOSIS — C49A3 Gastrointestinal stromal tumor of small intestine: Secondary | ICD-10-CM

## 2016-05-23 MED ORDER — GLEEVEC 400 MG PO TABS: 400.0000 mg | ORAL_TABLET | Freq: Every day | ORAL | 2 refills | Status: DC

## 2016-05-23 NOTE — Telephone Encounter (Signed)
Oral Chemotherapy Pharmacist Encounter  Received notification from [facility] that patient's Gleevec would require prior authorization Upon further investigation, appears patient's insurance has changed and we will need to send prescription to [facility]  I will e-scribe Gleevec prescription to [facility] in [place], [place] (ph: [phone])  Oral Oncology Clinic will continue to follow.  Johny Drilling, PharmD, BCPS, BCOP 05/23/2016  4:40 PM Oral Oncology Clinic [phone]

## 2016-05-26 ENCOUNTER — Telehealth: Payer: Self-pay | Admitting: Hematology

## 2016-05-26 ENCOUNTER — Telehealth: Payer: Self-pay | Admitting: *Deleted

## 2016-05-26 NOTE — Telephone Encounter (Signed)
If she does not have any bowel issues, probably does not need a colonoscopy. She does not need it for her history of small bowel GIST. Please let her know, thanks.   Truitt Merle MD

## 2016-05-26 NOTE — Telephone Encounter (Signed)
If she does not have any bowel issues, probably does not need a colonoscopy. She does not need it for her history of small bowel GIST. Please let her know, thanks Spoke with pt and informed her of Dr. Ernestina Penna above instructions.  Pt voiced understanding.

## 2016-05-26 NOTE — Telephone Encounter (Signed)
Pt called wanting to know if Dr. Burr Medico would want pt to schedule  for a colonoscopy before next office visit. Pt's   Phone    [phone]

## 2016-05-26 NOTE — Telephone Encounter (Signed)
Patient called to schedule 3 month follow up for Dr. Burr Medico. Per 03/18/2016 notes Patient aware of time and date.

## 2016-06-02 NOTE — Progress Notes (Signed)
[employer]  [phone]) [phone] Fax:(336) [phone]  Clinic Follow up Note   Patient Care Team: Kathyrn Lass, MD as PCP - General (Family Medicine) Earnstine Regal, PA-C as Physician Assistant (Obstetrics and Gynecology) Excell Seltzer, MD as Consulting Physician (General Surgery) Truitt Merle, MD as Consulting Physician ([place]) Tania Ade, RN as Registered Nurse (Medical Oncology) Earnstine Regal, PA-C as Physician Assistant (Obstetrics and Gynecology) 06/06/2016  CHIEF COMPLAINTS:  Follow up GIST   Oncology History   GIST (gastrointestinal stromal tumor) of jejunum with perforation, s/p SB resection 06/07/2014   Staging form: Small Intestine, AJCC 7th Edition     Pathologic stage from 07/03/2014: Stage Unknown (T4, NX, cM0) - Unsigned       GIST (gastrointestinal stromal tumor) of jejunum with perforation, s/p SB resection 06/07/2014   06/07/2014 Initial Diagnosis    GIST (gastrointestinal stromal tumor) of jejunum with perforation, s/p SB resection 06/07/2014      06/07/2014 Imaging    9.3 X 8.4 X 7.5 cm mass left lower quadrant. Recruitment of vasculature about the mass, mild nodularity overlying the adjacent descending colon.      06/07/2014 Pathologic Stage    pT4,pNX / GI:low grade; mitotic rate 5/50      06/07/2014 Surgery    Exploratory laparotomy resection of abdominal mass and portion of small bowel      06/07/2014 Miscellaneous    tumor was tested for c-kit gene mutation, (+) p.L456 deletion/insertion NP. This predicts good response to Tyrosine kinase inhibitor.      07/18/2014 -  Chemotherapy    Gleevec 400 mg po       HISTORY OF PRESENTING ILLNESS (08/15/2014):  Miranda Howe Minus 46 y.o. female is here because of recently diagnosed GIST.   She had scatter intermittent low mid and RLQ abdominal pain and nausea and vomiting since early this year and some weight loss 15 lbs (some intensional), she was initially developed with her  gynecologist and workup was negative except a cervical polyps. [facility] on March15 2016 with sudden onset of severe abdominal pain, shaking chills. CT scan of the abdomen showed a large intra-abdominal mass, she was treated with IV hydration and antibiotics for septic shock. She underwent urgent exploratory laparotomy and resection of the small bowel mass on 06/07/2014 by Dr. Excell Seltzer. According to the operation note, the mass is located in the proximal jejunum, and there was an obvious opening on the and 10 mesenteric site of jejunum into the mass, possibly a diverticulum or perforating into the mass. There is no free fluid in the abdomen. She tolerated the procedure well and no was subsequently discharged home on 06/15/2014.  She has been recovering well from the surgery, off pain meds for 5-6 days, more physically active now, she has good appetite and eating well, BM is normal, no blood in stool.  she is able to do some light housework and activities, but has not been back to her for activities. She is planning to return to work on 08/03/2014. 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   CURRENT THERAPY: Gleevec '400mg'$  dialy   INTERIM HISTORY:  Miranda Howe returns for follow-up. She is doing well today. She has been tired, but believes this is due to her being very busy at work. No issues with her Gleevec. There were two nights in a row where she woke up and vomited a couple of weeks ago. It hasn't happened since. Denies bloating, swelling, fever, bowel problems,  or any other concerns.   SURGICAL HISTORY: Past Surgical History:  Procedure Laterality Date  . BARTHOLIN GLAND CYST EXCISION  01/17/2009   I&D  . CERVICAL POLYPECTOMY  2010  . LAPAROTOMY N/A 06/07/2014   Procedure: EXPLORATORY LAPAROTOMY RESECTION OF ABDOMINAL MASS AND PORTION OF SMALL BOWEL;  Surgeon: Glenna Fellows, MD;  Location: [facility];  Service: General;  Laterality: N/A;  . WISDOM TOOTH EXTRACTION       SOCIAL HISTORY: History   Social History  . Marital Status: Married    Spouse Name: N/A  . Number of Children: 2 children, age of 93 and 78   . Years of Education: N/A   Occupational History  . Not on file.   Social History Main Topics  . Smoking status: Never Smoker   . Smokeless tobacco: Never Used  . Alcohol Use: Yes     Comment: socially  . Drug Use: No  . Sexual Activity: Yes    Birth Control/ Protection: Condom, Other-see comments     Comment: pt use gel    Other Topics Concern  . Not on file   Social History Narrative   Married, husband Publishing copy   Employed as elementary school principle   # 2 children    FAMILY HISTORY: Family History  Problem Relation Age of Onset  . Heart disease Father   . Hypertension Father   . Cancer Father 96    colon cancer; currently 30  . Other Mother     varicose veins  . Cancer Mother 61    breast cancer; currently 60  . Hypertension Mother   . Diabetes Mother   . Lupus Cousin   . Cancer Paternal Grandfather     colon cancer (age?); deceased  . Cancer Maternal Aunt     uterine cancer ~67; deceased 15s  . Cancer Paternal Uncle     pancreatic; deceased 87s  . Cancer Paternal Uncle     colon cancer 55s; currently 77  . Cancer Paternal Uncle     colon cancer 61s; currently 45s    ALLERGIES:  is allergic to sulfa antibiotics.  MEDICATIONS:  Current Outpatient Prescriptions  Medication Sig Dispense Refill  . acetaminophen (TYLENOL) 325 MG tablet Take 2 tablets (650 mg total) by mouth every 6 (six) hours as needed for mild pain, moderate pain, fever or headache.    . citalopram (CELEXA) 10 MG tablet Take 10 mg by mouth daily.    Marland Kitchen GLEEVEC 400 MG tablet Take 1 tablet (400 mg total) by mouth daily. Take with meals and large glass of water.Caution:Chemotherapy. 90 tablet 2  . ibuprofen (ADVIL,MOTRIN) 200 MG tablet Take 400 mg by mouth every 6 (six) hours as needed for moderate pain.    . Multiple Vitamin (MULTIVITAMIN)  tablet Take 1 tablet by mouth daily.    Marland Kitchen oxyCODONE (OXY IR/ROXICODONE) 5 MG immediate release tablet Take 1-2 tablets (5-10 mg total) by mouth every 4 (four) hours as needed for moderate pain. (Patient not taking: Reported on 03/18/2016) 50 tablet 0   No current facility-administered medications for this visit.     REVIEW OF SYSTEMS:   Constitutional: Denies fevers, chills or abnormal night sweats. (+) mild fatigue Eyes: Denies blurriness of vision, double vision or watery eyes Ears, nose, mouth, throat, and face: Denies mucositis or sore throat Respiratory: Denies cough, dyspnea or wheezes Cardiovascular: Denies palpitation, chest discomfort or lower extremity swelling Gastrointestinal:  Denies nausea, heartburn or change in bowel habits (+) 2 episodes of vomiting  Skin: Denies abnormal skin rashes Lymphatics: Denies new lymphadenopathy or easy bruising Neurological:Denies numbness, tingling or new weaknesses Behavioral/Psych: Mood is stable, no new changes  All other systems were reviewed with the patient and are negative.  PHYSICAL EXAMINATION: ECOG PERFORMANCE STATUS: 0  Vitals:   06/06/16 1411  BP: (!) 101/56  Pulse: (!) 57  Resp: 18  Temp: 98.5 F (36.9 C)   Filed Weights   06/06/16 1411  Weight: 172 lb 8 oz (78.2 kg)   GENERAL:alert, no distress and comfortable SKIN: skin color, texture, turgor are normal, no rashes or significant lesions EYES: normal, conjunctiva are pink and non-injected, sclera clear OROPHARYNX:no exudate, no erythema and lips, buccal mucosa, and tongue normal  NECK: supple, thyroid normal size, non-tender, without nodularity LYMPH:  no palpable lymphadenopathy in the cervical, axillary or inguinal LUNGS: clear to auscultation and percussion with normal breathing effort HEART: regular rate & rhythm and no murmurs and no lower extremity edema ABDOMEN:abdomen soft, non-tender and normal bowel sounds. Midline surgical scar is well-healed.   Musculoskeletal:no cyanosis of digits and no clubbing  PSYCH: alert & oriented x 3 with fluent speech NEURO: no focal motor/sensory deficits  LABORATORY DATA:  I have reviewed the data as listed CBC Latest Ref Rng & Units 06/06/2016 03/27/2016 03/27/2016  WBC 3.9 - 10.3 10e3/uL 8.5 - 7.9  Hemoglobin 11.6 - 15.9 g/dL 12.5 13.3 12.5  Hematocrit 34.8 - 46.6 % 37.1 39.0 36.7  Platelets 145 - 400 10e3/uL 239 - 212     CMP Latest Ref Rng & Units 06/06/2016 03/27/2016 03/10/2016  Glucose 70 - 140 mg/dl 115 121(H) 85  BUN 7.0 - 26.0 mg/dL 13.8 19 11.7  Creatinine 0.6 - 1.1 mg/dL 1.1 1.20(H) 0.9  Sodium 136 - 145 mEq/L 140 139 138  Potassium 3.5 - 5.1 mEq/L 4.4 3.6 4.1  Chloride 101 - 111 mmol/L - 104 -  CO2 22 - 29 mEq/L 26 - 24  Calcium 8.4 - 10.4 mg/dL 9.7 - 9.4  Total Protein 6.4 - 8.3 g/dL 7.0 - 7.0  Total Bilirubin 0.20 - 1.20 mg/dL 0.57 - 0.75  Alkaline Phos 40 - 150 U/L 73 - 67  AST 5 - 34 U/L 22 - 20  ALT 0 - 55 U/L 24 - 21   PATHOLOGY REPORT Diagnosis 06/07/2014 Small intestine, resection for tumor, Abdominal mass - GASTROINTESTINAL STROMAL TUMOR, 10.2 CM WITH CENTRAL NECROTIC CAVITY. - SEE ONCOLOGY TABLE. Microscopic Comment GASTROINTESTINAL STROMAL TUMOR (GIST): Procedure: Resection. Tumor Site: Small bowel. Tumor Size: 10.2 cm. Tumor Focality: Unifocal. GIST Subtype: Spindle cell. Mitotic Rate: 2/50 HIGH POWER FIELD Necrosis: Present, central. Histologic Grade: G1: Low grade; mitotic rate 5/50 HIGH POWER FIELD. Risk Assessment: High risk. Margins: Free of tumor. Distance of tumor from closest margin: Less than 0.2 cm. Ancillary testing: Immunohistochemistry. Lymph nodes: number examined: 0; number positive: N/A Pathologic Staging: pT4, pNX Comments: The tumor is composed of spindle cells with minimal to mild cytologic atypia and rare mitotic figures (2 per 50 high power field). Much of the central portion of the tumor is cavitary with associated necrosis and  the overlying small bowel mucosa is ulcerated and communicates with the central cavity within the tumor. The tumor extends to the subserosal connective tissue and is focally adjacent to the visceral peritoneum. Immunohistochemistry shows strong positivity with CD117 and smooth muscle actin and negative staining with S100, desmin and muscle specific actin. (JDP:ecj 06/09/2014)    RADIOGRAPHIC STUDIES: I have personally reviewed the radiological images as listed  and agreed with the findings in the report.  DG Chest 2 View 03/27/2016 IMPRESSION: No radiographic evidence of acute cardiopulmonary disease  CT abdomen and pelvis w contrast 03/10/2016 IMPRESSION: 1. Stable. No new or progressive findings to suggest recurrent or metastatic disease.  Ct chest wo contrast 11/06/2015 IMPRESSION: 1. No significant change in tiny bilateral pulmonary nodules. 2.  No acute process or evidence of metastatic disease in the chest.  ASSESSMENT & PLAN:  46 y.o. Caucasian female without significant past medical history except anxiety who was found to have a 10 cm proximal jejunal mass, with perforation.   1. 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 -Giving the size of her tumor, this is stage III. Although it's low grade, giving the large size and perforation, this is consider a high risk tumor, the risk of recurrence is about 50%.   -The nature of the disease was reviewed with patient and her husband in great details, patient had many questions and I answered to her satisfaction. -I recommend adjuvant Gleevec for 3 years to reduce the risk of recurrence in the future. -She tolerating Gleevec well, without significant side effects. We'll continue -continue surveillance. Per NCCN, patient will be seen and exam every 3-6 months  for 5 years, then annually. CT scan will be obtained every 6 months for 5 years, then annually. -I previously reviewed her surveillance CT abdomen and pelvis from 03/10/2016, which showed no evidence of recurrence. She has stable right kidney cyst, and tiny hypodense liver lesions, likely benign.  -She is clinically doing very well, her physical exam today is normal, lab results were reviewed with her, no clinical suspicion for recurrence. We'll continue surveillance. -Ordered repeat CT scan to be done before next visit.   2. Anxiety -Continue Celexa.  3. Right lung nodules -she was found to have to 4 mm small nodules in the right lung on CT scan 07/16/2015. -She never smoked, risk of lung cancer is low -Her follow-up CT scan in August 2017 showed stable tiny lung nodules, likely benign. -may repeat CT chest in June 2018   Plan -continue [place] -Order repeat scans to be done before next visit.  -Return to clinic in 3 months with lab   Patient had a several questions about her CT scan findings, all questions were answered to her satisfaction. The patient knows to call the clinic with any problems, questions or concerns.  I spent 20 minutes counseling the patient face to face. The total time spent in the appointment was 25 minutes and more than 50% was on counseling.  This document serves as a record of services personally performed by Truitt Merle, MD. It was created on her behalf by Martinique Casey, a trained medical scribe. The creation of this record is based on the scribe's personal observations and the provider's statements to them. This document has been checked and approved by the attending provider.  I have reviewed the above documentation for accuracy and completeness and I agree with the above.    Truitt Merle, MD 06/06/2016

## 2016-06-06 ENCOUNTER — Other Ambulatory Visit (HOSPITAL_BASED_OUTPATIENT_CLINIC_OR_DEPARTMENT_OTHER): Payer: 59

## 2016-06-06 ENCOUNTER — Telehealth: Payer: Self-pay | Admitting: Hematology

## 2016-06-06 ENCOUNTER — Encounter: Payer: Self-pay | Admitting: Hematology

## 2016-06-06 ENCOUNTER — Ambulatory Visit (HOSPITAL_BASED_OUTPATIENT_CLINIC_OR_DEPARTMENT_OTHER): Payer: 59 | Admitting: Hematology

## 2016-06-06 VITALS — BP 101/56 | HR 57 | Temp 98.5°F | Resp 18 | Ht 63.0 in | Wt 172.5 lb

## 2016-06-06 DIAGNOSIS — C49A3 Gastrointestinal stromal tumor of small intestine: Secondary | ICD-10-CM | POA: Diagnosis not present

## 2016-06-06 DIAGNOSIS — F419 Anxiety disorder, unspecified: Secondary | ICD-10-CM | POA: Diagnosis not present

## 2016-06-06 DIAGNOSIS — E039 Hypothyroidism, unspecified: Secondary | ICD-10-CM

## 2016-06-06 LAB — CBC & DIFF AND RETIC
BASO%: 0.5 % (ref 0.0–2.0)
BASOS ABS: 0 10*3/uL (ref 0.0–0.1)
EOS ABS: 0.2 10*3/uL (ref 0.0–0.5)
EOS%: 2.4 % (ref 0.0–7.0)
HCT: 37.1 % (ref 34.8–46.6)
HEMOGLOBIN: 12.5 g/dL (ref 11.6–15.9)
IMMATURE RETIC FRACT: 2.2 % (ref 1.60–10.00)
LYMPH%: 37.5 % (ref 14.0–49.7)
MCH: 31.3 pg (ref 25.1–34.0)
MCHC: 33.7 g/dL (ref 31.5–36.0)
MCV: 93 fL (ref 79.5–101.0)
MONO#: 0.4 10*3/uL (ref 0.1–0.9)
MONO%: 4.5 % (ref 0.0–14.0)
NEUT#: 4.7 10*3/uL (ref 1.5–6.5)
NEUT%: 55.1 % (ref 38.4–76.8)
PLATELETS: 239 10*3/uL (ref 145–400)
RBC: 3.99 10*6/uL (ref 3.70–5.45)
RDW: 13.5 % (ref 11.2–14.5)
Retic %: 1.31 % (ref 0.70–2.10)
Retic Ct Abs: 52.27 10*3/uL (ref 33.70–90.70)
WBC: 8.5 10*3/uL (ref 3.9–10.3)
lymph#: 3.2 10*3/uL (ref 0.9–3.3)

## 2016-06-06 LAB — COMPREHENSIVE METABOLIC PANEL
ALBUMIN: 4.1 g/dL (ref 3.5–5.0)
ALK PHOS: 73 U/L (ref 40–150)
ALT: 24 U/L (ref 0–55)
AST: 22 U/L (ref 5–34)
Anion Gap: 8 mEq/L (ref 3–11)
BILIRUBIN TOTAL: 0.57 mg/dL (ref 0.20–1.20)
BUN: 13.8 mg/dL (ref 7.0–26.0)
CALCIUM: 9.7 mg/dL (ref 8.4–10.4)
CO2: 26 mEq/L (ref 22–29)
Chloride: 106 mEq/L (ref 98–109)
Creatinine: 1.1 mg/dL (ref 0.6–1.1)
EGFR: 62 mL/min/{1.73_m2} — AB (ref >90)
Glucose: 115 mg/dl (ref 70–140)
POTASSIUM: 4.4 meq/L (ref 3.5–5.1)
Sodium: 140 mEq/L (ref 136–145)
TOTAL PROTEIN: 7 g/dL (ref 6.4–8.3)

## 2016-06-06 NOTE — Telephone Encounter (Signed)
2 bottles of contrast given to patient, with copy of instructions. Appointments scheduled per 06/06/16 los. Patient was given a copy of the AVS report and appointment schedule, per 06/06/16 los.

## 2016-09-03 ENCOUNTER — Other Ambulatory Visit: Payer: 59

## 2016-09-05 ENCOUNTER — Other Ambulatory Visit: Payer: 59

## 2016-09-05 ENCOUNTER — Ambulatory Visit (HOSPITAL_COMMUNITY): Admission: RE | Admit: 2016-09-05 | Payer: 59 | Source: Ambulatory Visit

## 2016-09-05 ENCOUNTER — Ambulatory Visit: Payer: 59 | Admitting: Hematology

## 2016-09-05 ENCOUNTER — Encounter (HOSPITAL_COMMUNITY): Payer: Self-pay

## 2016-09-08 ENCOUNTER — Encounter: Payer: 59 | Admitting: Hematology

## 2016-09-08 NOTE — Progress Notes (Signed)
This encounter was created in error - please disregard.

## 2016-09-27 DIAGNOSIS — R350 Frequency of micturition: Secondary | ICD-10-CM | POA: Diagnosis not present

## 2016-09-27 DIAGNOSIS — N3001 Acute cystitis with hematuria: Secondary | ICD-10-CM | POA: Diagnosis not present

## 2016-10-06 ENCOUNTER — Ambulatory Visit: Payer: 59 | Admitting: Hematology

## 2016-10-06 ENCOUNTER — Other Ambulatory Visit: Payer: 59

## 2016-10-16 ENCOUNTER — Encounter (HOSPITAL_COMMUNITY): Payer: Self-pay

## 2016-10-16 ENCOUNTER — Ambulatory Visit (HOSPITAL_COMMUNITY)
Admission: RE | Admit: 2016-10-16 | Discharge: 2016-10-16 | Disposition: A | Discharge status: Home / Self Care | Payer: 59 | Source: Ambulatory Visit | Attending: Hematology | Admitting: Hematology

## 2016-10-16 DIAGNOSIS — C49A0 Gastrointestinal stromal tumor, unspecified site: Secondary | ICD-10-CM | POA: Diagnosis not present

## 2016-10-16 DIAGNOSIS — R918 Other nonspecific abnormal finding of lung field: Secondary | ICD-10-CM | POA: Diagnosis not present

## 2016-10-16 DIAGNOSIS — Z8509 Personal history of malignant neoplasm of other digestive organs: Secondary | ICD-10-CM | POA: Insufficient documentation

## 2016-10-16 DIAGNOSIS — K8020 Calculus of gallbladder without cholecystitis without obstruction: Secondary | ICD-10-CM | POA: Diagnosis not present

## 2016-10-16 DIAGNOSIS — C49A3 Gastrointestinal stromal tumor of small intestine: Secondary | ICD-10-CM | POA: Diagnosis present

## 2016-10-16 HISTORY — DX: Malignant (primary) neoplasm, unspecified: C80.1

## 2016-10-16 MED ORDER — IOPAMIDOL (ISOVUE-300) INJECTION 61%
INTRAVENOUS | Status: AC
  Filled 2016-10-16: qty 100

## 2016-10-16 MED ORDER — IOPAMIDOL (ISOVUE-300) INJECTION 61%
100.0000 mL | Freq: Once | INTRAVENOUS | Status: AC | PRN
  Administered 2016-10-16: 100 mL via INTRAVENOUS

## 2016-10-20 ENCOUNTER — Ambulatory Visit (HOSPITAL_COMMUNITY): Admission: RE | Admit: 2016-10-20 | Payer: 59 | Source: Ambulatory Visit | Admitting: Hematology

## 2016-10-22 ENCOUNTER — Other Ambulatory Visit: Payer: 59

## 2016-10-29 ENCOUNTER — Ambulatory Visit: Payer: 59

## 2016-10-29 ENCOUNTER — Ambulatory Visit: Payer: 59 | Admitting: Hematology

## 2016-10-29 ENCOUNTER — Telehealth: Payer: Self-pay | Admitting: Hematology

## 2016-10-29 NOTE — Telephone Encounter (Signed)
lvm to inform pt of added lab appt 8/8 atr 3 pm per sch msg

## 2016-11-03 ENCOUNTER — Telehealth: Payer: Self-pay | Admitting: *Deleted

## 2016-11-03 NOTE — Telephone Encounter (Signed)
Called pt on cell phone and left message on voice mail requesting a call back to nurse.  Pt was NO  SHOW   10/29/16.

## 2016-11-03 NOTE — Telephone Encounter (Signed)
-----   Message from Truitt Merle, MD sent at 11/03/2016  5:23 PM EDT -----   ----- Message ----- From: Truitt Merle, MD Sent: 11/02/2016   6:27 PM To: Arlice Colt Pod [address]  She did not show for her appointment on 8/8, which was rescheduled, not sure if she knew. I have sent a scheduling message to reschedule, but nothing scheduled.   Please make sure she knows her surveillance CT in 09/2016 was good, no evidence of recurrence. Please encourage her to follow up within the next month  She is a school principle, maybe busy with work lately. OK to schedule lab and see me at 4pm if that works for her batter   [employer]

## 2016-11-16 DIAGNOSIS — R300 Dysuria: Secondary | ICD-10-CM | POA: Diagnosis not present

## 2016-11-16 DIAGNOSIS — N390 Urinary tract infection, site not specified: Secondary | ICD-10-CM | POA: Diagnosis not present

## 2016-12-12 ENCOUNTER — Other Ambulatory Visit: Payer: Self-pay | Admitting: Pharmacist

## 2017-03-10 ENCOUNTER — Telehealth: Payer: Self-pay | Admitting: Hematology

## 2017-03-10 NOTE — Telephone Encounter (Signed)
Left message for patient regarding upcoming January appointments per 12/14 sch message

## 2017-03-29 NOTE — Progress Notes (Deleted)
[place]  Telephone:(336) [phone] Fax:(336) [phone]  Clinic Follow up Note   Patient Care Team: Kathyrn Lass, MD as PCP - General (Family Medicine) Earnstine Regal, PA-C as Physician Assistant (Obstetrics and Gynecology) Excell Seltzer, MD as Consulting Physician (General Surgery) Truitt Merle, MD as Consulting Physician (Medical Oncology) Tania Ade, RN as Registered Nurse (Medical Oncology) Earnstine Regal, PA-C as Physician Assistant (Obstetrics and Gynecology) 03/29/2017  CHIEF COMPLAINT: Follow up GIST  SUMMARY OF ONCOLOGIC HISTORY: Oncology History   GIST (gastrointestinal stromal tumor) of jejunum with perforation, s/p SB resection 06/07/2014   Staging form: Small Intestine, AJCC 7th Edition     Pathologic stage from 07/03/2014: Stage Unknown (T4, NX, cM0) - Unsigned       GIST (gastrointestinal stromal tumor) of jejunum with perforation, s/p SB resection 06/07/2014   06/07/2014 Initial Diagnosis    GIST (gastrointestinal stromal tumor) of jejunum with perforation, s/p SB resection 06/07/2014      06/07/2014 Imaging    9.3 X 8.4 X 7.5 cm mass left lower quadrant. Recruitment of vasculature about the mass, mild nodularity overlying the adjacent descending colon.      06/07/2014 Pathologic Stage    pT4,pNX / GI:low grade; mitotic rate 5/50      06/07/2014 Surgery    Exploratory laparotomy resection of abdominal mass and portion of small bowel      06/07/2014 Miscellaneous    tumor was tested for c-kit gene mutation, (+) p.T701 deletion/insertion NP. This predicts good response to Tyrosine kinase inhibitor.      07/18/2014 -  Chemotherapy    Gleevec 400 mg po      10/16/2016 Imaging    CT CAP W Contrast 10/16/16 IMPRESSION: No evidence of recurrent neoplasm or metastatic disease within the chest, abdomen, or pelvis. Stable sub-cm right upper lobe pulmonary nodules, consistent with benign etiology. Cholelithiasis.  No radiographic evidence of  cholecystitis     CURRENT THERAPY: Gleevec 400 mg daily   INTERVAL HISTORY: Please see below for problem oriented charting.  REVIEW OF SYSTEMS:   Constitutional: Denies fevers, chills or abnormal weight loss Eyes: Denies blurriness of vision Ears, nose, mouth, throat, and face: Denies mucositis or sore throat Respiratory: Denies cough, dyspnea or wheezes Cardiovascular: Denies palpitation, chest discomfort or lower extremity swelling Gastrointestinal:  Denies nausea, heartburn or change in bowel habits Skin: Denies abnormal skin rashes Lymphatics: Denies new lymphadenopathy or easy bruising Neurological:Denies numbness, tingling or new weaknesses Behavioral/Psych: Mood is stable, no new changes  All other systems were reviewed with the patient and are negative.  MEDICAL HISTORY:  Past Medical History:  Diagnosis Date  . Anxiety   . Family history of colon cancer   . gist dx'd 06/07/14    SURGICAL HISTORY: Past Surgical History:  Procedure Laterality Date  . BARTHOLIN GLAND CYST EXCISION  01/17/2009   I&D  . CERVICAL POLYPECTOMY  2010  . LAPAROTOMY N/A 06/07/2014   Procedure: EXPLORATORY LAPAROTOMY RESECTION OF ABDOMINAL MASS AND PORTION OF SMALL BOWEL;  Surgeon: Excell Seltzer, MD;  Location: [facility];  Service: General;  Laterality: N/A;  . WISDOM TOOTH EXTRACTION      I have reviewed the social history and family history with the patient and they are unchanged from previous note.  ALLERGIES:  is allergic to sulfa antibiotics.  MEDICATIONS:  Current Outpatient Medications  Medication Sig Dispense Refill  . acetaminophen (TYLENOL) 325 MG tablet Take 2 tablets (650 mg total) by mouth every 6 (six) hours as needed for  mild pain, moderate pain, fever or headache.    . citalopram (CELEXA) 10 MG tablet Take 10 mg by mouth daily.    Marland Kitchen GLEEVEC 400 MG tablet Take 1 tablet (400 mg total) by mouth daily. Take with meals and large glass of water.Caution:Chemotherapy. 90 tablet 2    . ibuprofen (ADVIL,MOTRIN) 200 MG tablet Take 400 mg by mouth every 6 (six) hours as needed for moderate pain.    . Multiple Vitamin (MULTIVITAMIN) tablet Take 1 tablet by mouth daily.    Marland Kitchen oxyCODONE (OXY IR/ROXICODONE) 5 MG immediate release tablet Take 1-2 tablets (5-10 mg total) by mouth every 4 (four) hours as needed for moderate pain. (Patient not taking: Reported on 03/18/2016) 50 tablet 0   No current facility-administered medications for this visit.     PHYSICAL EXAMINATION: ECOG PERFORMANCE STATUS: {CHL ONC ECOG PS:[phone]}  There were no vitals filed for this visit. There were no vitals filed for this visit.  GENERAL:alert, no distress and comfortable SKIN: skin color, texture, turgor are normal, no rashes or significant lesions EYES: normal, Conjunctiva are pink and non-injected, sclera clear OROPHARYNX:no exudate, no erythema and lips, buccal mucosa, and tongue normal  NECK: supple, thyroid normal size, non-tender, without nodularity LYMPH:  no palpable lymphadenopathy in the cervical, axillary or inguinal LUNGS: clear to auscultation and percussion with normal breathing effort HEART: regular rate & rhythm and no murmurs and no lower extremity edema ABDOMEN:abdomen soft, non-tender and normal bowel sounds Musculoskeletal:no cyanosis of digits and no clubbing  NEURO: alert & oriented x 3 with fluent speech, no focal motor/sensory deficits  LABORATORY DATA:  I have reviewed the data as listed CBC Latest Ref Rng & Units 06/06/2016 03/27/2016 03/27/2016  WBC 3.9 - 10.3 10e3/uL 8.5 - 7.9  Hemoglobin 11.6 - 15.9 g/dL 12.5 13.3 12.5  Hematocrit 34.8 - 46.6 % 37.1 39.0 36.7  Platelets 145 - 400 10e3/uL 239 - 212     CMP Latest Ref Rng & Units 06/06/2016 03/27/2016 03/10/2016  Glucose 70 - 140 mg/dl 115 121(H) 85  BUN 7.0 - 26.0 mg/dL 13.8 19 11.7  Creatinine 0.6 - 1.1 mg/dL 1.1 1.20(H) 0.9  Sodium 136 - 145 mEq/L 140 139 138  Potassium 3.5 - 5.1 mEq/L 4.4 3.6 4.1  Chloride  101 - 111 mmol/L - 104 -  CO2 22 - 29 mEq/L 26 - 24  Calcium 8.4 - 10.4 mg/dL 9.7 - 9.4  Total Protein 6.4 - 8.3 g/dL 7.0 - 7.0  Total Bilirubin 0.20 - 1.20 mg/dL 0.57 - 0.75  Alkaline Phos 40 - 150 U/L 73 - 67  AST 5 - 34 U/L 22 - 20  ALT 0 - 55 U/L 24 - 21   PATHOLOGY REPORT Diagnosis 06/07/2014 Small intestine, resection for tumor, Abdominal mass - GASTROINTESTINAL STROMAL TUMOR, 10.2 CM WITH CENTRAL NECROTIC CAVITY. - SEE ONCOLOGY TABLE. Microscopic Comment GASTROINTESTINAL STROMAL TUMOR (GIST): Procedure: Resection. Tumor Site: Small bowel. Tumor Size: 10.2 cm. Tumor Focality: Unifocal. GIST Subtype: Spindle cell. Mitotic Rate: 2/50 HIGH POWER FIELD Necrosis: Present, central. Histologic Grade: G1: Low grade; mitotic rate 5/50 HIGH POWER FIELD. Risk Assessment: High risk. Margins: Free of tumor. Distance of tumor from closest margin: Less than 0.2 cm. Ancillary testing: Immunohistochemistry. Lymph nodes: number examined: 0; number positive: N/A Pathologic Staging: pT4, pNX Comments: The tumor is composed of spindle cells with minimal to mild cytologic atypia and rare mitotic figures (2 per 50 high power field). Much of the central portion of the tumor is cavitary  with associated necrosis and the overlying small bowel mucosa is ulcerated and communicates with the central cavity within the tumor. The tumor extends to the subserosal connective tissue and is focally adjacent to the visceral peritoneum. Immunohistochemistry shows strong positivity with CD117 and smooth muscle actin and negative staining with S100, desmin and muscle specific actin. (JDP:ecj 06/09/2014)      RADIOGRAPHIC STUDIES: I have personally reviewed the radiological images as listed and agreed with the findings in the report. No results found.   ASSESSMENT & PLAN: 47 y.o. Caucasian female without significant past medical history except anxiety who was found to have a 10 cm proximal jejunal mass, with  perforation.   1. 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 Anxiety 3. Right lung nodules  PLAN No problem-specific Assessment & Plan notes found for this encounter.   No orders of the defined types were placed in this encounter.  All questions were answered. The patient knows to call the clinic with any problems, questions or concerns. No barriers to learning was detected. I spent {CHL ONC TIME VISIT - YIRSW:[id number]} counseling the patient face to face. The total time spent in the appointment was {CHL ONC TIME VISIT - XFGHW:[id number]} and more than 50% was on counseling and review of test results     Miranda Feeling, NP 03/29/17

## 2017-03-30 ENCOUNTER — Inpatient Hospital Stay: Payer: 59 | Admitting: Hematology

## 2017-03-30 ENCOUNTER — Encounter: Payer: Self-pay | Admitting: Hematology

## 2017-03-30 ENCOUNTER — Inpatient Hospital Stay: Payer: 59 | Attending: Hematology

## 2017-03-30 VITALS — BP 124/63 | HR 56 | Temp 98.1°F | Resp 18 | Ht 63.0 in | Wt 179.8 lb

## 2017-03-30 DIAGNOSIS — Z800 Family history of malignant neoplasm of digestive organs: Secondary | ICD-10-CM | POA: Diagnosis not present

## 2017-03-30 DIAGNOSIS — F419 Anxiety disorder, unspecified: Secondary | ICD-10-CM

## 2017-03-30 DIAGNOSIS — R911 Solitary pulmonary nodule: Secondary | ICD-10-CM | POA: Insufficient documentation

## 2017-03-30 DIAGNOSIS — Z808 Family history of malignant neoplasm of other organs or systems: Secondary | ICD-10-CM | POA: Insufficient documentation

## 2017-03-30 DIAGNOSIS — C49A3 Gastrointestinal stromal tumor of small intestine: Secondary | ICD-10-CM | POA: Insufficient documentation

## 2017-03-30 LAB — RETICULOCYTES
RBC.: 4.11 MIL/uL (ref 3.70–5.45)
RETIC COUNT ABSOLUTE: 65.8 10*3/uL (ref 33.7–90.7)
RETIC CT PCT: 1.6 % (ref 0.7–2.1)

## 2017-03-30 LAB — CBC WITH DIFFERENTIAL (CANCER CENTER ONLY)
Abs Granulocyte: 4.9 10*3/uL (ref 1.5–6.5)
BASOS PCT: 0 %
Basophils Absolute: 0 10*3/uL (ref 0.0–0.1)
EOS ABS: 0.3 10*3/uL (ref 0.0–0.5)
Eosinophils Relative: 3 %
HCT: 37.9 % (ref 34.8–46.6)
Hemoglobin: 12.6 g/dL (ref 11.6–15.9)
Lymphocytes Relative: 36 %
Lymphs Abs: 3.2 10*3/uL (ref 0.9–3.3)
MCH: 30.7 pg (ref 25.1–34.0)
MCHC: 33.2 g/dL (ref 31.5–36.0)
MCV: 92.2 fL (ref 79.5–101.0)
MONO ABS: 0.5 10*3/uL (ref 0.1–0.9)
MONOS PCT: 6 %
Neutro Abs: 4.9 10*3/uL (ref 1.5–6.5)
Neutrophils Relative %: 55 %
PLATELETS: 245 10*3/uL (ref 145–400)
RBC: 4.11 MIL/uL (ref 3.70–5.45)
RDW: 14.7 % (ref 11.2–16.1)
WBC: 9 10*3/uL (ref 4.0–10.3)

## 2017-03-30 LAB — COMPREHENSIVE METABOLIC PANEL
ALT: 29 U/L (ref 0–55)
ANION GAP: 7 (ref 3–11)
AST: 24 U/L (ref 5–34)
Albumin: 4.2 g/dL (ref 3.5–5.0)
Alkaline Phosphatase: 73 U/L (ref 40–150)
BILIRUBIN TOTAL: 0.4 mg/dL (ref 0.2–1.2)
BUN: 14 mg/dL (ref 7–26)
CO2: 27 mmol/L (ref 22–29)
Calcium: 9.7 mg/dL (ref 8.4–10.4)
Chloride: 106 mmol/L (ref 98–109)
Creatinine, Ser: 1.11 mg/dL — ABNORMAL HIGH (ref 0.60–1.10)
GFR, EST NON AFRICAN AMERICAN: 58 mL/min — AB (ref >60)
Glucose, Bld: 85 mg/dL (ref 70–140)
POTASSIUM: 4.2 mmol/L (ref 3.3–4.7)
Sodium: 140 mmol/L (ref 136–145)
TOTAL PROTEIN: 7 g/dL (ref 6.4–8.3)

## 2017-03-30 NOTE — Progress Notes (Signed)
Miranda Howe  [phone] [phone]  Clinic Follow up Note   Patient Care Team: Kathyrn Lass, MD as PCP - General (Family Medicine) Earnstine Regal, PA-C as Physician Assistant (Obstetrics and Gynecology) Excell Seltzer, MD as Consulting Physician (General Surgery) Truitt Merle, MD as Consulting Physician (Medical Oncology) Tania Ade, RN as Registered Nurse (Medical Oncology) Earnstine Regal, PA-C as Physician Assistant (Obstetrics and Gynecology) 03/30/2017  CHIEF COMPLAINTS:  Follow up GIST   Oncology History   GIST (gastrointestinal stromal tumor) of jejunum with perforation, s/p SB resection 06/07/2014   Staging form: Small Intestine, AJCC 7th Edition     Pathologic stage from 07/03/2014: Stage Unknown (T4, NX, cM0) - Unsigned       GIST (gastrointestinal stromal tumor) of jejunum with perforation, s/p SB resection 06/07/2014   06/07/2014 Initial Diagnosis    GIST (gastrointestinal stromal tumor) of jejunum with perforation, s/p SB resection 06/07/2014      06/07/2014 Imaging    9.3 X 8.4 X 7.5 cm mass left lower quadrant. Recruitment of vasculature about the mass, mild nodularity overlying the adjacent descending colon.      06/07/2014 Pathologic Stage    pT4,pNX / GI:low grade; mitotic rate 5/50      06/07/2014 Surgery    Exploratory laparotomy resection of abdominal mass and portion of small bowel      06/07/2014 Miscellaneous    tumor was tested for c-kit gene mutation, (+) p.R518 deletion/insertion NP. This predicts good response to Tyrosine kinase inhibitor.      07/18/2014 -  Chemotherapy    Gleevec 400 mg po      10/16/2016 Imaging    CT CAP W Contrast 10/16/16 IMPRESSION: No evidence of recurrent neoplasm or metastatic disease within the chest, abdomen, or pelvis. Stable sub-cm right upper lobe pulmonary nodules, consistent with benign etiology. Cholelithiasis.  No radiographic evidence of cholecystitis        HISTORY OF PRESENTING ILLNESS (08/15/2014):  Miranda Howe 47 y.o. female is here because of recently diagnosed GIST.   She had scatter intermittent low mid and RLQ abdominal pain and nausea and vomiting since early this year and some weight loss 15 lbs (some intensional), she was initially developed with her gynecologist and workup was negative except a cervical polyps. She presented to [facility] ED on March15 2016 with sudden onset of severe abdominal pain, shaking chills. CT scan of the abdomen showed a large intra-abdominal mass, she was treated with IV hydration and antibiotics for septic shock. She underwent urgent exploratory laparotomy and resection of the small bowel mass on 06/07/2014 by Dr. Excell Seltzer. According to the operation note, the mass is located in the proximal jejunum, and there was an obvious opening on the and 10 mesenteric site of jejunum into the mass, possibly a diverticulum or perforating into the mass. There is no free fluid in the abdomen. She tolerated the procedure well and no was subsequently discharged home on 06/15/2014.  She has been recovering well from the surgery, off pain meds for 5-6 days, more physically active now, she has good appetite and eating well, BM is normal, no blood in stool.  she is able to do some light housework and activities, but has not been back to her for activities. She is planning to return to work on 08/03/2014. 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   CURRENT THERAPY: Gleevec '400mg'$  dialy   INTERIM HISTORY:  [name] returns for follow-up. She reports that she has been  doing well overall in the interim and without any new complaints or issues. She does report that she'll very occasionally feel sick and fatigued secondary to her [place], but this is brief and typically resolves on its own and is manageable. She did start a new principle position at [employer] in July and she is enjoying it.     On review  of systems, pt denies fever, chills, rash, mouth sores, weight loss, decreased appetite, urinary complaints. Denies pain. Pt denies abdominal pain, nausea, vomiting. Pertinent positives are listed and detailed within the above HPI.   SURGICAL HISTORY: Past Surgical History:  Procedure Laterality Date  . BARTHOLIN GLAND CYST EXCISION  01/17/2009   I&D  . CERVICAL POLYPECTOMY  2010  . LAPAROTOMY N/A 06/07/2014   Procedure: EXPLORATORY LAPAROTOMY RESECTION OF ABDOMINAL MASS AND PORTION OF SMALL BOWEL;  Surgeon: Excell Seltzer, MD;  Location: [facility];  Service: General;  Laterality: N/A;  . WISDOM TOOTH EXTRACTION      SOCIAL HISTORY: History   Social History  . Marital Status: Married    Spouse Name: N/A  . Number of Children: 2 children, age of 70 and 40   . Years of Education: N/A   Occupational History  . Not on file.   Social History Main Topics  . Smoking status: Never Smoker   . Smokeless tobacco: Never Used  . Alcohol Use: Yes     Comment: socially  . Drug Use: No  . Sexual Activity: Yes    Birth Control/ Protection: Condom, Other-see comments     Comment: pt use gel    Other Topics Concern  . 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 Heart disease Father   . Hypertension Father   . Cancer Father 55       colon cancer; currently 27  . Other Mother        varicose veins  . Cancer Mother 54       breast cancer; currently 60  . Hypertension Mother   . Diabetes Mother   . Lupus Cousin   . Cancer Paternal Grandfather        colon cancer (age?); deceased  . Cancer Maternal Aunt        uterine cancer ~67; deceased 68s  . Cancer Paternal Uncle        pancreatic; deceased 68s  . Cancer Paternal Uncle        colon cancer 53s; currently 22  . Cancer Paternal Uncle        colon cancer 52s; currently 82s     ALLERGIES:  is allergic to sulfa antibiotics.  MEDICATIONS:  Current Outpatient Medications  Medication Sig Dispense Refill  . acetaminophen (TYLENOL) 325 MG tablet Take 2 tablets (650 mg total) by mouth every 6 (six) hours as needed for mild pain, moderate pain, fever or headache.    . citalopram (CELEXA) 10 MG tablet Take 10 mg by mouth daily.    Marland Kitchen GLEEVEC 400 MG tablet Take 1 tablet (400 mg total) by mouth daily. Take with meals and large glass of water.Caution:Chemotherapy. 90 tablet 2  . ibuprofen (ADVIL,MOTRIN) 200 MG tablet Take 400 mg by mouth every 6 (six) hours as needed for moderate pain.    . Multiple Vitamin (MULTIVITAMIN) tablet Take 1 tablet by mouth daily.    Marland Kitchen  oxyCODONE (OXY IR/ROXICODONE) 5 MG immediate release tablet Take 1-2 tablets (5-10 mg total) by mouth every 4 (four) hours as needed for moderate pain. (Patient not taking: Reported on 03/18/2016) 50 tablet 0   No current facility-administered medications for this visit.     REVIEW OF SYSTEMS:   Constitutional: Denies fevers, chills or abnormal night sweats. (+) mild occasional fatigue Eyes: Denies blurriness of vision, double vision or watery eyes Ears, nose, mouth, throat, and face: Denies mucositis or sore throat Respiratory: Denies cough, dyspnea or wheezes Cardiovascular: Denies palpitation, chest discomfort or lower extremity swelling Gastrointestinal:  Denies nausea, heartburn or change in bowel habits  Skin: Denies abnormal skin rashes Lymphatics: Denies new lymphadenopathy or easy bruising Neurological:Denies numbness, tingling or new weaknesses Behavioral/Psych: Mood is stable, no new changes  All other systems were reviewed with the patient and are negative.  PHYSICAL EXAMINATION: ECOG PERFORMANCE STATUS: 0  Vitals:   03/30/17 1326  BP: 124/63  Pulse: (!) 56  Resp: 18  Temp: 98.1 F (36.7 C)  SpO2: 100%   Filed Weights   03/30/17 1326  Weight: 179 lb 12.8 oz (81.6 kg)    GENERAL:alert, no distress and comfortable SKIN: skin color, texture, turgor are normal, no rashes or significant lesions EYES: normal, conjunctiva are pink and non-injected, sclera clear OROPHARYNX:no exudate, no erythema and lips, buccal mucosa, and tongue normal  NECK: supple, thyroid normal size, non-tender, without nodularity LYMPH:  no palpable lymphadenopathy in the cervical, axillary or inguinal LUNGS: clear to auscultation and percussion with normal breathing effort HEART: regular rate & rhythm and no murmurs and no lower extremity edema ABDOMEN:abdomen soft, non-tender and normal bowel sounds. Midline surgical scar is well-healed.  Musculoskeletal:no cyanosis of digits and no clubbing  PSYCH: alert & oriented x 3 with fluent speech NEURO: no focal motor/sensory deficits  LABORATORY DATA:  I have reviewed the data as listed CBC Latest Ref Rng & Units 03/30/2017 06/06/2016 03/27/2016  WBC 3.9 - 10.3 10e3/uL - 8.5 -  Hemoglobin 11.6 - 15.9 g/dL - 12.5 13.3  Hematocrit 34.8 - 46.6 % 37.9 37.1 39.0  Platelets 145 - 400 10e3/uL - 239 -     CMP Latest Ref Rng & Units 03/30/2017 06/06/2016 03/27/2016  Glucose 70 - 140 mg/dL 85 115 121(H)  BUN 7 - 26 mg/dL 14 13.8 19  Creatinine 0.60 - 1.10 mg/dL 1.11(H) 1.1 1.20(H)  Sodium 136 - 145 mmol/L 140 140 139  Potassium 3.3 - 4.7 mmol/L 4.2 4.4 3.6  Chloride 98 - 109 mmol/L 106 - 104  CO2 22 - 29 mmol/L 27 26 -  Calcium 8.4 - 10.4 mg/dL 9.7 9.7 -  Total Protein 6.4 - 8.3 g/dL 7.0 7.0 -  Total Bilirubin 0.2 - 1.2 mg/dL 0.4 0.57 -  Alkaline Phos 40 - 150 U/L 73 73 -  AST 5 - 34 U/L 24 22 -  ALT 0 - 55 U/L 29 24 -   PATHOLOGY REPORT Diagnosis 06/07/2014 Small intestine, resection for tumor, Abdominal mass - GASTROINTESTINAL STROMAL TUMOR, 10.2 CM WITH CENTRAL NECROTIC CAVITY. - SEE ONCOLOGY TABLE. Microscopic Comment GASTROINTESTINAL STROMAL TUMOR (GIST): Procedure: Resection. Tumor Site: Small bowel. Tumor Size: 10.2 cm. Tumor  Focality: Unifocal. GIST Subtype: Spindle cell. Mitotic Rate: 2/50 HIGH POWER FIELD Necrosis: Present, central. Histologic Grade: G1: Low grade; mitotic rate 5/50 HIGH POWER FIELD. Risk Assessment: High risk. Margins: Free of tumor. Distance of tumor from closest margin: Less than 0.2 cm. Ancillary testing: Immunohistochemistry. Lymph nodes: number examined: 0;  number positive: N/A Pathologic Staging: pT4, pNX Comments: The tumor is composed of spindle cells with minimal to mild cytologic atypia and rare mitotic figures (2 per 50 high power field). Much of the central portion of the tumor is cavitary with associated necrosis and the overlying small bowel mucosa is ulcerated and communicates with the central cavity within the tumor. The tumor extends to the subserosal connective tissue and is focally adjacent to the visceral peritoneum. Immunohistochemistry shows strong positivity with CD117 and smooth muscle actin and negative staining with S100, desmin and muscle specific actin. (JDP:ecj 06/09/2014)    RADIOGRAPHIC STUDIES: I have personally reviewed the radiological images as listed and agreed with the findings in the report.  DG Chest 2 View 03/27/2016 IMPRESSION: No radiographic evidence of acute cardiopulmonary disease  CT abdomen and pelvis w contrast 03/10/2016 IMPRESSION: 1. Stable. No new or progressive findings to suggest recurrent or metastatic disease.  Ct chest wo contrast 11/06/2015 IMPRESSION: 1. No significant change in tiny bilateral pulmonary nodules. 2.  No acute process or evidence of metastatic disease in the chest.  CT CAP 10/16/16  IMPRESSION: No evidence of recurrent neoplasm or metastatic disease within the chest, abdomen, or pelvis. Stable sub-cm right upper lobe pulmonary nodules, consistent with benign etiology. Cholelithiasis.  No radiographic evidence of cholecystitis.  ASSESSMENT & PLAN:  47 y.o. Caucasian female without significant past  medical history except anxiety who was found to have a 10 cm proximal jejunal mass, with perforation.   1. 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 -Giving the size of her tumor, this is stage III. Although it's low grade, giving the large size and perforation, this is consider a high risk tumor, the risk of recurrence is about 50%.   -The nature of the disease was reviewed with patient and her husband in great details, patient had many questions and I answered to her satisfaction. -I recommend adjuvant Gleevec for 3 years to reduce the risk of recurrence in the future. -She tolerating Gleevec well, without significant side effects. We'll continue -continue surveillance. Per [facility], patient will be seen and exam every 3-6 months for 5 years, then annually. CT scan will be obtained every 6 months for 5 years, then annually. -I previously reviewed her surveillance CT abdomen and pelvis from 09/2016, which showed no evidence of recurrence. She has stable right kidney cyst, and tiny hypodense liver lesions, likely benign.  -She is clinically doing very well, her physical exam today is normal, lab results were reviewed with her, no clinical suspicion for recurrence. We'll continue surveillance. -She will completed 3 years of Wilkin in April 2019 -Continue surveillance.  Follow-up in 6 months with repeated CT chest, abdomen and pelvis.  2. Anxiety -Continue Celexa.  3. Right lung nodules -she was found to have to 4 mm small nodules in the right lung on CT scan 07/16/2015. -She never smoked, risk of lung cancer is low -Her follow-up CT scan in July 2018 showed stable tiny lung nodules, likely benign.  We will repeat her CT scan chest again in 6 months.  Plan -continue [place] until the end of April 2019 (she will complete 3  years adjuvant therapy) -Order repeat scans to be done before next visit.  -Return to clinic in 6 months with lab   Patient had a several questions about her  CT scan findings, all questions were answered to her satisfaction. The patient knows to call the clinic with any problems, questions or concerns.  I spent 20 minutes counseling the patient face to face. The total time spent in the appointment was 25 minutes and more than 50% was on counseling.  This document serves as a record of services personally performed by Truitt Merle, MD. It was created on her behalf by Reola Mosher, a trained medical scribe. The creation of this record is based on the scribe's personal observations and the provider's statements to them. This document has been checked and approved by the attending provider.  I have reviewed the above documentation for accuracy and completeness, and I agree with the above.     Truitt Merle, MD 03/30/2017

## 2017-04-01 ENCOUNTER — Telehealth: Payer: Self-pay | Admitting: Hematology

## 2017-04-01 NOTE — Telephone Encounter (Signed)
Scheduled appt per 1/7 los - Sent reminder letter in the mail with appt date and time . [facility] to contact patient with appt date and time.

## 2017-04-29 DIAGNOSIS — Z01419 Encounter for gynecological examination (general) (routine) without abnormal findings: Secondary | ICD-10-CM | POA: Diagnosis not present

## 2017-04-29 DIAGNOSIS — Z6831 Body mass index (BMI) 31.0-31.9, adult: Secondary | ICD-10-CM | POA: Diagnosis not present

## 2017-04-29 DIAGNOSIS — Z124 Encounter for screening for malignant neoplasm of cervix: Secondary | ICD-10-CM | POA: Diagnosis not present

## 2017-05-29 DIAGNOSIS — Z13220 Encounter for screening for lipoid disorders: Secondary | ICD-10-CM | POA: Diagnosis not present

## 2017-09-23 ENCOUNTER — Telehealth: Payer: Self-pay | Admitting: Hematology

## 2017-09-23 ENCOUNTER — Other Ambulatory Visit: Payer: Self-pay

## 2017-09-23 ENCOUNTER — Other Ambulatory Visit: Payer: Self-pay | Admitting: Hematology

## 2017-09-23 ENCOUNTER — Telehealth: Payer: Self-pay

## 2017-09-23 ENCOUNTER — Inpatient Hospital Stay: Payer: 59

## 2017-09-23 DIAGNOSIS — C49A3 Gastrointestinal stromal tumor of small intestine: Secondary | ICD-10-CM

## 2017-09-23 NOTE — Telephone Encounter (Signed)
Tried to reach patient regarding schedule °

## 2017-09-23 NOTE — Telephone Encounter (Signed)
Left voice message for patient with appointment for CT scan on Friday 7/5 at 10:30 am at [facility] to arrive at 10:15.  You are scheduled for lab work today at 11:00 and will need to pick up prep for CT before you leave.  Requested patient call back acknowledging receipt of this message.

## 2017-09-25 ENCOUNTER — Ambulatory Visit (HOSPITAL_COMMUNITY): Admission: RE | Admit: 2017-09-25 | Payer: 59 | Source: Ambulatory Visit

## 2017-09-25 ENCOUNTER — Inpatient Hospital Stay: Payer: 59 | Attending: Hematology

## 2017-09-30 ENCOUNTER — Inpatient Hospital Stay: Payer: 59 | Admitting: Hematology

## 2017-10-02 ENCOUNTER — Telehealth: Payer: Self-pay | Admitting: Hematology

## 2017-10-02 NOTE — Telephone Encounter (Signed)
LMVM for patient regarding appointments  Per 7/11 sch msg

## 2017-10-29 ENCOUNTER — Inpatient Hospital Stay: Payer: 59 | Attending: Hematology | Admitting: Nurse Practitioner

## 2017-10-29 ENCOUNTER — Inpatient Hospital Stay: Payer: 59

## 2018-01-29 DIAGNOSIS — H524 Presbyopia: Secondary | ICD-10-CM | POA: Diagnosis not present

## 2018-01-29 DIAGNOSIS — H5213 Myopia, bilateral: Secondary | ICD-10-CM | POA: Diagnosis not present

## 2018-03-11 ENCOUNTER — Telehealth: Payer: Self-pay | Admitting: Hematology

## 2018-03-11 NOTE — Telephone Encounter (Signed)
Tried to reach regarding voicemail °

## 2018-03-18 ENCOUNTER — Other Ambulatory Visit: Payer: Self-pay | Admitting: Hematology

## 2018-03-18 ENCOUNTER — Other Ambulatory Visit: Payer: Self-pay

## 2018-03-18 DIAGNOSIS — C49A3 Gastrointestinal stromal tumor of small intestine: Secondary | ICD-10-CM

## 2018-03-19 ENCOUNTER — Telehealth: Payer: Self-pay | Admitting: Hematology

## 2018-03-19 NOTE — Telephone Encounter (Signed)
Scheduled appt per 12/257 sch message - left message for patient with appt date and time

## 2018-03-22 ENCOUNTER — Ambulatory Visit (HOSPITAL_COMMUNITY): Admission: RE | Admit: 2018-03-22 | Payer: 59 | Source: Ambulatory Visit

## 2018-03-22 ENCOUNTER — Ambulatory Visit (HOSPITAL_COMMUNITY)
Admission: RE | Admit: 2018-03-22 | Discharge: 2018-03-22 | Disposition: A | Discharge status: Home / Self Care | Payer: 59 | Source: Ambulatory Visit | Attending: Hematology | Admitting: Hematology

## 2018-03-22 ENCOUNTER — Inpatient Hospital Stay: Payer: 59 | Attending: Hematology

## 2018-03-22 DIAGNOSIS — N898 Other specified noninflammatory disorders of vagina: Secondary | ICD-10-CM | POA: Diagnosis not present

## 2018-03-22 DIAGNOSIS — C49A3 Gastrointestinal stromal tumor of small intestine: Secondary | ICD-10-CM | POA: Insufficient documentation

## 2018-03-22 LAB — RETICULOCYTES
Immature Retic Fract: 7.6 % (ref 2.3–15.9)
RBC.: 4.58 MIL/uL (ref 3.87–5.11)
Retic Count, Absolute: 70.1 10*3/uL (ref 19.0–186.0)
Retic Ct Pct: 1.5 % (ref 0.4–3.1)

## 2018-03-22 LAB — CMP (CANCER CENTER ONLY)
ALBUMIN: 4 g/dL (ref 3.5–5.0)
ALK PHOS: 92 U/L (ref 38–126)
ALT: 19 U/L (ref 0–44)
ANION GAP: 8 (ref 5–15)
AST: 17 U/L (ref 15–41)
BUN: 14 mg/dL (ref 6–20)
CALCIUM: 9.8 mg/dL (ref 8.9–10.3)
CO2: 27 mmol/L (ref 22–32)
CREATININE: 0.85 mg/dL (ref 0.44–1.00)
Chloride: 104 mmol/L (ref 98–111)
GFR, Est AFR Am: >60 mL/min (ref >60)
GFR, Estimated: >60 mL/min (ref >60)
GLUCOSE: 94 mg/dL (ref 70–99)
Potassium: 5 mmol/L (ref 3.5–5.1)
SODIUM: 139 mmol/L (ref 135–145)
Total Bilirubin: 0.4 mg/dL (ref 0.3–1.2)
Total Protein: 7.1 g/dL (ref 6.5–8.1)

## 2018-03-22 LAB — CBC WITH DIFFERENTIAL (CANCER CENTER ONLY)
ABS IMMATURE GRANULOCYTES: 0.03 10*3/uL (ref 0.00–0.07)
Basophils Absolute: 0 10*3/uL (ref 0.0–0.1)
Basophils Relative: 1 %
EOS ABS: 0.2 10*3/uL (ref 0.0–0.5)
Eosinophils Relative: 2 %
HEMATOCRIT: 40.7 % (ref 36.0–46.0)
HEMOGLOBIN: 13.5 g/dL (ref 12.0–15.0)
Immature Granulocytes: 0 %
LYMPHS ABS: 2.9 10*3/uL (ref 0.7–4.0)
Lymphocytes Relative: 37 %
MCH: 29.5 pg (ref 26.0–34.0)
MCHC: 33.2 g/dL (ref 30.0–36.0)
MCV: 88.9 fL (ref 80.0–100.0)
MONOS PCT: 7 %
Monocytes Absolute: 0.5 10*3/uL (ref 0.1–1.0)
NEUTROS PCT: 53 %
Neutro Abs: 4.2 10*3/uL (ref 1.7–7.7)
Platelet Count: 304 10*3/uL (ref 150–400)
RBC: 4.58 MIL/uL (ref 3.87–5.11)
RDW: 13.1 % (ref 11.5–15.5)
WBC Count: 7.9 10*3/uL (ref 4.0–10.5)
nRBC: 0 % (ref 0.0–0.2)

## 2018-03-22 MED ORDER — IOHEXOL 300 MG/ML  SOLN
100.0000 mL | Freq: Once | INTRAMUSCULAR | Status: AC | PRN
  Administered 2018-03-22: 100 mL via INTRAVENOUS

## 2018-03-22 MED ORDER — SODIUM CHLORIDE (PF) 0.9 % IJ SOLN
INTRAMUSCULAR | Status: AC
  Filled 2018-03-22: qty 50

## 2018-03-30 ENCOUNTER — Inpatient Hospital Stay: Payer: 59 | Attending: Hematology | Admitting: Hematology

## 2018-03-30 ENCOUNTER — Telehealth: Payer: Self-pay | Admitting: Hematology

## 2018-03-30 NOTE — Telephone Encounter (Signed)
Called patient per 1/7 sch message - left message for patient to call back to r/s appt

## 2018-04-05 ENCOUNTER — Telehealth: Payer: Self-pay | Admitting: Hematology

## 2018-04-05 NOTE — Telephone Encounter (Signed)
Scheduled appt per 1/13 sch message - sent reminder letter in the mail with appt date and time

## 2018-04-05 NOTE — Telephone Encounter (Signed)
Patient had a no-show for her office visit last week.  I called her today, and reviewed her lab and CT scan results, which were all unremarkable, no concern for GISTrecurrence.  She will follow-up with her gynecologist for the left perivaginal cyst, which she had before.  She is clinically doing well, denies any abdominal discomfort, except acid reflux occasionally if she eats dinner late.  She states that she has no other concerns.  She agrees to continue follow-up with me.  I will schedule her next appointment in 6 months with lab. She appreciated the call.  Truitt Merle  04/05/2018

## 2018-06-14 DIAGNOSIS — Z6831 Body mass index (BMI) 31.0-31.9, adult: Secondary | ICD-10-CM | POA: Diagnosis not present

## 2018-10-01 ENCOUNTER — Other Ambulatory Visit: Payer: Self-pay

## 2018-10-01 DIAGNOSIS — C49A3 Gastrointestinal stromal tumor of small intestine: Secondary | ICD-10-CM

## 2018-10-04 ENCOUNTER — Other Ambulatory Visit: Payer: 59

## 2018-10-04 ENCOUNTER — Ambulatory Visit: Payer: 59 | Admitting: Hematology

## 2021-09-06 ENCOUNTER — Ambulatory Visit: Payer: 59 | Attending: Obstetrics and Gynecology | Admitting: Physical Therapy

## 2021-09-06 ENCOUNTER — Encounter: Payer: Self-pay | Admitting: Physical Therapy

## 2021-09-06 DIAGNOSIS — M6281 Muscle weakness (generalized): Secondary | ICD-10-CM | POA: Diagnosis present

## 2021-09-06 DIAGNOSIS — R293 Abnormal posture: Secondary | ICD-10-CM

## 2021-09-06 DIAGNOSIS — R279 Unspecified lack of coordination: Secondary | ICD-10-CM

## 2021-09-06 NOTE — Patient Instructions (Signed)

## 2021-09-06 NOTE — Therapy (Addendum)
OUTPATIENT PHYSICAL THERAPY FEMALE PELVIC EVALUATION   Patient Name: Miranda Howe MRN: [id number] DOB:02-26-1971, 51 y.o., female Today's Date: 09/06/2021 End of session: START: 0459 XHF: 4142 TOTAL: 38 mins   Past Medical History:  Diagnosis Date   Anxiety    Family history of colon cancer    gist dx'd 06/07/14   Past Surgical History:  Procedure Laterality Date   BARTHOLIN GLAND CYST EXCISION  01/17/2009   I&D   CERVICAL POLYPECTOMY  2010   LAPAROTOMY N/A 06/07/2014   Procedure: EXPLORATORY LAPAROTOMY RESECTION OF ABDOMINAL MASS AND PORTION OF SMALL BOWEL;  Surgeon: Excell Seltzer, MD;  Location: [facility];  Service: General;  Laterality: N/A;   [facility] EXTRACTION     Patient Active Problem List   Diagnosis Date Noted   Genetic testing 08/08/2014   Family history of colon cancer    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  Rationale for Evaluation and Treatment Rehabilitation  ONSET DATE: Initially started after having children ~20 years ago  SUBJECTIVE:                                                                                                                                                                                           SUBJECTIVE STATEMENT: Pt reports leakage of urine with jogging/running/ sneezing/laughing/coughing/ exercises.   Fluid intake: Yes: 6x 8oz glasses of water, coffee 4x cups of coffee     PAIN:  Are you having pain? No  PRECAUTIONS: None  WEIGHT BEARING RESTRICTIONS No  FALLS:  Has patient fallen in last 6 months?  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   PLOF: Independent  PATIENT GOALS to have less leakage   PERTINENT HISTORY:  CERVICAL POLYPECTOMY, BARTHOLIN GLAND CYST EXCISION GASTROINTESTINAL STROMAL TUMOR (GIST): 10 cm proximal jejunal mass, with perforation, stage III 2019 Sexual abuse: No  BOWEL MOVEMENT Pain with bowel movement: No Type of bowel movement:Type (Bristol Stool Scale) 4, Frequency every other day, and Strain No Fully empty rectum: No Leakage: No Pads: No Fiber supplement: No  URINATION Pain with urination: No Fully empty bladder: Yes: sometimes feels like she needs  to double void Stream: Strong Urgency: No Frequency: not quicker than every 2 hours Leakage: Coughing, Sneezing, Laughing, Exercise, and Lifting Pads: Yes: sometimes  INTERCOURSE Pain with intercourse:  not painful  Ability to have vaginal penetration:  Yes:   Climax: not painful  Marinoff Scale: 0/3  PREGNANCY Vaginal deliveries 2 Tearing Yes: tearing with both, needed stitches C-section deliveries 0 Currently pregnant No  PROLAPSE None    OBJECTIVE:   DIAGNOSTIC FINDINGS:    COGNITION:  Overall cognitive status: Within functional limits for tasks assessed     SENSATION:  Light touch: Appears intact  Proprioception: Appears intact  MUSCLE LENGTH: Bil hamstrings and adductors limited by 25%                 POSTURE: rounded shoulders and posterior pelvic tilt   PELVIC ALIGNMENT:  LUMBARAROM/PROM  A/PROM A/PROM  eval  Flexion Limited by 50%  Extension WFL  Right lateral flexion WFL  Left lateral flexion WFL  Right rotation Limited by 25%  Left rotation Limited by 25%   (Blank rows = not tested)  LOWER EXTREMITY ROM:  Bil WFL  LOWER EXTREMITY MMT:  Bil hips grossly 4/5; knees and ankles 5/5   PALPATION:   General  no TTP, mild fascial  restrictions in lower abdominal quadrants but not painful                External Perineal Exam no TTP, mild dryness noted, decreased mobility of clitoral hood                             Internal Pelvic Floor no TTP  Patient confirms identification and approves PT to assess internal pelvic floor and treatment Yes  PELVIC MMT:   MMT eval  Vaginal 2/5 with valsalva, unable to gain contraction with exhale coordinated; 4s isometric; 4 reps  Internal Anal Sphincter   External Anal Sphincter   Puborectalis   Diastasis Recti   (Blank rows = not tested)        TONE: Mildly decreased  PROLAPSE: Not seen in hooklying with strong cough  TODAY'S TREATMENT  EVAL 09/06/2021 :Examination completed, findings reviewed, pt educated on POC, HEP, and feminine moisturizers. Pt motivated to participate in PT and agreeable to attempt recommendations.      PATIENT EDUCATION:  Education details: [email or web address] Person educated: Patient Education method: Explanation, Demonstration, Tactile cues, Verbal cues, and Handouts Education comprehension: verbalized understanding and returned demonstration   HOME EXERCISE PROGRAM: [id number]  ASSESSMENT:  CLINICAL IMPRESSION: Patient is a 51 y.o. female  who was seen today for physical therapy evaluation and treatment for urinary incontinence with stressors of jogging/running/ sneezing/laughing/coughing/ exercises. Pt reports she has had leakage since birth of daughter ~20 years ago and reports she sometimes feels limited in working out due to leakage. Pt found to have decreased flexibility in spine and bil hips, decreased strength in bil hips and core, decreased coordination with breathing mechanics with noted chest breathing and limited core activations. Pt consented to internal vaginal assessment this date and found to have mild dryness externally at vulva, decreased strength, coordination, and endurance.  Pt needed verbal cues for improved mechanics but unable to  achieve pelvic floor contraction with coordinated exhale at this time. Pt given HEP and handout on feminine moisturizers if interested in this. Pt denied additional questioned at end of session. Pt would benefit from additional PT to further address deficits.     OBJECTIVE  IMPAIRMENTS decreased coordination, decreased endurance, decreased strength, increased fascial restrictions, improper body mechanics, and postural dysfunction.   ACTIVITY LIMITATIONS carrying, lifting, squatting, and continence  PARTICIPATION LIMITATIONS: community activity  PERSONAL FACTORS Fitness, Time since onset of injury/illness/exacerbation, and 1 comorbidity: x2 vaginal births with tearing, history of stage 3 stromal tumor  are also affecting patient's functional outcome.   REHAB POTENTIAL: Good  CLINICAL DECISION MAKING: Stable/uncomplicated  EVALUATION COMPLEXITY: Low   GOALS: Goals reviewed with patient? Yes  SHORT TERM GOALS: Target date: 10/04/2021  Pt to be I with HEP.  Baseline: Goal status: INITIAL  2. Pt to demonstrate at least 3/5 pelvic floor strength for improved pelvic stability and decreased strain at pelvic floor/ decrease leakage.  Baseline:  Goal status: INITIAL  3.  Pt to demonstrate improved coordination of pelvic floor and breathing mechanics for decreased strain at pelvic floor for body weight squat without leakage.  Baseline:  Goal status: INITIAL    LONG TERM GOALS: Target date:  12/07/21    Pt to be I with advanced HEP.  Baseline:  Goal status: INITIAL  2.  Pt to demonstrate at least 4/5 pelvic floor strength for improved pelvic stability and decreased strain at pelvic floor/ decrease leakage.  Baseline:  Goal status: INITIAL  3.  Pt to demonstrate improved coordination of pelvic floor and breathing mechanics for decreased strain at pelvic floor for 20# squat without leakage.  Baseline:  Goal status: INITIAL  4.  Pt to report at least a 75% decrease in urinary leakage  outside of clinic for improved QOL.  Baseline:  Goal status: INITIAL   PLAN: PT FREQUENCY: 1x/week  PT DURATION:  10 sessions  PLANNED INTERVENTIONS: Therapeutic exercises, Therapeutic activity, Neuromuscular re-education, Patient/Family education, Joint mobilization, Dry Needling, Spinal mobilization, Cryotherapy, Moist heat, Manual lymph drainage, scar mobilization, Taping, Biofeedback, and Manual therapy  PLAN FOR NEXT SESSION: coordination of pelvic floor and breathing with exercises; abdominal ad hip strengthening; breathing mechanics   Stacy Gardner, PT, DPT 09/06/2309:23 AM    PHYSICAL THERAPY DISCHARGE SUMMARY  Visits from Start of Care: 1  Current functional level related to goals / functional outcomes: Unable to formally reassess, unfortunately pt has been unable to return to additional appointments.    Remaining deficits: Unable to formally reassess   Education / Equipment: HEP   Patient agrees to discharge. Patient goals were partially met. Patient is being discharged due to not returning since the last visit.  Stacy Gardner, PT, DPT 08/01/238:39 AM

## 2021-09-12 ENCOUNTER — Ambulatory Visit: Payer: 59 | Admitting: Physical Therapy

## 2021-09-27 ENCOUNTER — Ambulatory Visit: Payer: 59 | Admitting: Physical Therapy

## 2021-10-04 ENCOUNTER — Ambulatory Visit: Payer: 59 | Attending: Obstetrics and Gynecology | Admitting: Physical Therapy

## 2021-10-04 DIAGNOSIS — M6281 Muscle weakness (generalized): Secondary | ICD-10-CM | POA: Insufficient documentation

## 2021-10-04 DIAGNOSIS — R279 Unspecified lack of coordination: Secondary | ICD-10-CM | POA: Insufficient documentation

## 2021-10-04 DIAGNOSIS — R293 Abnormal posture: Secondary | ICD-10-CM | POA: Insufficient documentation

## 2021-10-11 ENCOUNTER — Ambulatory Visit: Payer: 59 | Admitting: Physical Therapy

## 2021-10-11 ENCOUNTER — Telehealth: Payer: Self-pay | Admitting: Physical Therapy

## 2021-10-11 NOTE — Telephone Encounter (Signed)
PT called pt about this morning's appointment at 1015. Pt did not answer, voicemail left.   This is pt's second missed appointment.  Stacy Gardner, PT, DPT 10/11/2308:50 AM

## 2021-10-18 ENCOUNTER — Ambulatory Visit: Payer: 59 | Admitting: Physical Therapy

## 2021-10-25 ENCOUNTER — Encounter: Payer: 59 | Admitting: Physical Therapy

## 2021-11-01 ENCOUNTER — Encounter: Payer: 59 | Admitting: Physical Therapy

## 2021-11-08 ENCOUNTER — Encounter: Payer: 59 | Admitting: Physical Therapy

## 2021-11-14 ENCOUNTER — Encounter: Payer: 59 | Admitting: Physical Therapy

## 2021-11-18 ENCOUNTER — Telehealth: Payer: Self-pay | Admitting: Hematology

## 2021-11-18 NOTE — Telephone Encounter (Signed)
Scheduled appt per 8/28 staff msg. Pt is aware of appt date/time.

## 2021-11-21 ENCOUNTER — Encounter: Payer: 59 | Admitting: Physical Therapy

## 2021-11-21 ENCOUNTER — Inpatient Hospital Stay: Payer: 59

## 2021-11-21 ENCOUNTER — Encounter: Payer: Self-pay | Admitting: Hematology

## 2021-11-21 ENCOUNTER — Other Ambulatory Visit: Payer: Self-pay

## 2021-11-21 ENCOUNTER — Inpatient Hospital Stay: Payer: 59 | Attending: Hematology | Admitting: Hematology

## 2021-11-21 VITALS — BP 119/73 | HR 84 | Temp 98.4°F | Resp 19 | Ht 62.5 in | Wt 179.3 lb

## 2021-11-21 DIAGNOSIS — C49A3 Gastrointestinal stromal tumor of small intestine: Secondary | ICD-10-CM

## 2021-11-21 DIAGNOSIS — Z79899 Other long term (current) drug therapy: Secondary | ICD-10-CM | POA: Insufficient documentation

## 2021-11-21 DIAGNOSIS — F419 Anxiety disorder, unspecified: Secondary | ICD-10-CM | POA: Insufficient documentation

## 2021-11-21 LAB — CBC WITH DIFFERENTIAL/PLATELET
Abs Immature Granulocytes: 0.03 10*3/uL (ref 0.00–0.07)
Basophils Absolute: 0.1 10*3/uL (ref 0.0–0.1)
Basophils Relative: 1 %
Eosinophils Absolute: 0.1 10*3/uL (ref 0.0–0.5)
Eosinophils Relative: 1 %
HCT: 38.5 % (ref 36.0–46.0)
Hemoglobin: 13.4 g/dL (ref 12.0–15.0)
Immature Granulocytes: 0 %
Lymphocytes Relative: 35 %
Lymphs Abs: 3.1 10*3/uL (ref 0.7–4.0)
MCH: 29.5 pg (ref 26.0–34.0)
MCHC: 34.8 g/dL (ref 30.0–36.0)
MCV: 84.8 fL (ref 80.0–100.0)
Monocytes Absolute: 0.5 10*3/uL (ref 0.1–1.0)
Monocytes Relative: 6 %
Neutro Abs: 5 10*3/uL (ref 1.7–7.7)
Neutrophils Relative %: 57 %
Platelets: 291 10*3/uL (ref 150–400)
RBC: 4.54 MIL/uL (ref 3.87–5.11)
RDW: 13 % (ref 11.5–15.5)
WBC: 8.8 10*3/uL (ref 4.0–10.5)
nRBC: 0 % (ref 0.0–0.2)

## 2021-11-21 LAB — COMPREHENSIVE METABOLIC PANEL
ALT: 21 U/L (ref 0–44)
AST: 20 U/L (ref 15–41)
Albumin: 4.6 g/dL (ref 3.5–5.0)
Alkaline Phosphatase: 60 U/L (ref 38–126)
Anion gap: 6 (ref 5–15)
BUN: 26 mg/dL — ABNORMAL HIGH (ref 6–20)
CO2: 26 mmol/L (ref 22–32)
Calcium: 9.8 mg/dL (ref 8.9–10.3)
Chloride: 106 mmol/L (ref 98–111)
Creatinine, Ser: 1.03 mg/dL — ABNORMAL HIGH (ref 0.44–1.00)
GFR, Estimated: >60 mL/min (ref >60)
Glucose, Bld: 100 mg/dL — ABNORMAL HIGH (ref 70–99)
Potassium: 3.8 mmol/L (ref 3.5–5.1)
Sodium: 138 mmol/L (ref 135–145)
Total Bilirubin: 0.5 mg/dL (ref 0.3–1.2)
Total Protein: 7.2 g/dL (ref 6.5–8.1)

## 2021-11-21 NOTE — Progress Notes (Signed)
[place]   Telephone:(336) [phone] [phone]   Clinic Follow up Note   Patient Care Team: Kathyrn Lass, MD as PCP - General (Family Medicine) Earnstine Regal, PA-C as Physician Assistant (Obstetrics and Gynecology) Excell Seltzer, MD (Inactive) as Consulting Physician (General Surgery) Truitt Merle, MD as Consulting Physician (Medical Oncology) Tania Ade, RN as Registered Nurse (Medical Oncology) Earnstine Regal, PA-C as Physician Assistant (Obstetrics and Gynecology)  Date of Service:  11/21/2021  CHIEF COMPLAINT: f/u of GIST  CURRENT THERAPY:  Surveillance  ASSESSMENT & PLAN:  Miranda Howe is a 51 y.o. female with   1. 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 Completed 3 years adjuvant Gleevec in 06/2017. -most recent CT AP on file from 03/22/18 showed NED. -she was lost to f/u after 03/2017 appointment; she no-showed to several appointments. -she has clinically been doing very well. Physical exam was unremarkable. I will send her for new baseline labs today. -plan to obtain new surveillance CT in the next few weeks; I ordered today.   2. Genetics, Cancer screening -she has a strong family history of various cancers. I will refer her to genetics. -she continues annual mammograms and pap smears [facility] -she has not yet had a colonoscopy.  3. Anxiety -Continue Celexa.     Plan -lab today -CT CAP to be done in next few weeks, will call her with results  -lab and f/u in 1 year   No problem-specific Assessment & Plan notes found for this encounter.   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 Recruitment of vasculature about the mass, mild nodularity overlying the adjacent descending colon.   06/07/2014 Pathologic Stage   pT4,pNX / GI:low grade; mitotic rate 5/50   06/07/2014 Surgery   Exploratory laparotomy resection of abdominal mass and portion of small bowel   06/07/2014 Miscellaneous   tumor was tested for c-kit gene mutation, (+) p.W737 deletion/insertion NP. This predicts good response to Tyrosine kinase inhibitor.   07/18/2014 - 06/2017 Chemotherapy   Gleevec 400 mg po   10/16/2016 Imaging   CT CAP W Contrast 10/16/16 IMPRESSION: No evidence of recurrent neoplasm or metastatic disease within the chest, abdomen, or pelvis.  Stable sub-cm right upper lobe pulmonary nodules, consistent with benign etiology.  Cholelithiasis.  No radiographic evidence of cholecystitis   10/16/2016 Imaging   10/16/2016 CT CAP IMPRESSION: No evidence of recurrent neoplasm or metastatic disease within the chest, abdomen, or pelvis.  Stable sub-cm right upper lobe pulmonary nodules, consistent with benign etiology.  Cholelithiasis.  No radiographic evidence of cholecystitis.   03/23/2018 Imaging   03/23/2018 CT AP IMPRESSION: 1. No evidence of local recurrence or metastatic disease. 2. No acute abdominopelvic findings. 3. Slight enlargement of left perivaginal cyst, likely a Bartholin cyst.      INTERVAL HISTORY:  Miranda Howe is here for a follow  up of GIST. She was last seen by me in 03/2017. She presents to the clinic alone. She reports she is doing well, though busy. She tells me her mother, who has a history of breast cancer, was recently diagnosed with  metastatic lung cancer. She reports she has some reflux, denies nausea, vomiting, or cramping.   All other systems were reviewed with the patient and are negative.  MEDICAL HISTORY:  Past Medical History:  Diagnosis Date   Anxiety    Family history of colon cancer    gist dx'd 06/07/14    SURGICAL HISTORY: Past Surgical History:  Procedure Laterality Date   BARTHOLIN GLAND CYST EXCISION  01/17/2009   I&D   CERVICAL POLYPECTOMY  2010   LAPAROTOMY N/A 06/07/2014   Procedure: EXPLORATORY LAPAROTOMY RESECTION OF ABDOMINAL MASS AND PORTION OF SMALL BOWEL;  Surgeon: Excell Seltzer, MD;  Location: [facility];  Service: General;  Laterality: N/A;   WISDOM TOOTH EXTRACTION      I have reviewed the social history and family history with the patient and they are unchanged from previous note.  ALLERGIES:  is allergic to sulfa antibiotics.  MEDICATIONS:  Current Outpatient Medications  Medication Sig Dispense Refill   acetaminophen (TYLENOL) 325 MG tablet Take 2 tablets (650 mg total) by mouth every 6 (six) hours as needed for mild pain, moderate pain, fever or headache.     citalopram (CELEXA) 10 MG tablet Take 10 mg by mouth daily.     ibuprofen (ADVIL,MOTRIN) 200 MG tablet Take 400 mg by mouth every 6 (six) hours as needed for moderate pain.     Multiple Vitamin (MULTIVITAMIN) tablet Take 1 tablet by mouth daily.     No current facility-administered medications for this visit.    PHYSICAL EXAMINATION: ECOG PERFORMANCE STATUS: 0 - Asymptomatic  Vitals:   11/21/21 1338  BP: 119/73  Pulse: 84  Resp: 19  Temp: 98.4 F (36.9 C)  SpO2: 98%   Wt Readings from Last 3 Encounters:  11/21/21 179 lb 4.8 oz (81.3 kg)  03/30/17 179 lb 12.8 oz (81.6 kg)  06/06/16 172 lb 8 oz (78.2 kg)     GENERAL:alert, no distress and comfortable SKIN: skin color, texture, turgor are normal, no rashes or significant lesions EYES: normal, Conjunctiva are pink and non-injected, sclera clear  NECK:  supple, thyroid normal size, non-tender, without nodularity LYMPH:  no palpable lymphadenopathy in the cervical, axillary  LUNGS: clear to auscultation and percussion with normal breathing effort HEART: regular rate & rhythm and no murmurs and no lower extremity edema ABDOMEN:abdomen soft, non-tender and normal bowel sounds Musculoskeletal:no cyanosis of digits and no clubbing  NEURO: alert & oriented x 3 with fluent speech, no focal motor/sensory deficits  LABORATORY DATA:  I have reviewed the data as listed    Latest Ref Rng & Units 11/21/2021    2:44 PM 03/22/2018    2:57 PM 03/30/2017   12:42 PM  CBC  WBC 4.0 - 10.5 K/uL 8.8  7.9  9.0   Hemoglobin 12.0 - 15.0 g/dL 13.4  13.5  12.6   Hematocrit 36.0 - 46.0 % 38.5  40.7  37.9   Platelets 150 - 400 K/uL 291  304  245         Latest Ref Rng & Units 11/21/2021    2:44 PM 03/22/2018    2:57 PM 03/30/2017   12:42 PM  CMP  Glucose 70 - 99 mg/dL 100  94  85   BUN 6 -  20 mg/dL $Remove'26  14  14   'JbdcwDr$ Creatinine 0.44 - 1.00 mg/dL 1.03  0.85  1.11   Sodium 135 - 145 mmol/L 138  139  140   Potassium 3.5 - 5.1 mmol/L 3.8  5.0  4.2   Chloride 98 - 111 mmol/L 106  104  106   CO2 22 - 32 mmol/L $RemoveB'26  27  27   'EycMtzEe$ Calcium 8.9 - 10.3 mg/dL 9.8  9.8  9.7   Total Protein 6.5 - 8.1 g/dL 7.2  7.1  7.0   Total Bilirubin 0.3 - 1.2 mg/dL 0.5  0.4  0.4   Alkaline Phos 38 - 126 U/L 60  92  73   AST 15 - 41 U/L $Remo'20  17  24   'XzdGE$ ALT 0 - 44 U/L $Remo'21  19  29       'fbPlV$ RADIOGRAPHIC STUDIES: I have personally reviewed the radiological images as listed and agreed with the findings in the report. No results found.    Orders Placed This Encounter  Procedures   CT CHEST ABDOMEN PELVIS W CONTRAST    Standing Status:   Future    Standing Expiration Date:   11/22/2022    Order Specific Question:   Is patient pregnant?    Answer:   No    Order Specific Question:   Preferred imaging location?    Answer:   [facility]    Order Specific Question:   Release to patient     Answer:   Immediate    Order Specific Question:   Is Oral Contrast requested for this exam?    Answer:   Yes, Per Radiology protocol   Comprehensive metabolic panel    Standing Status:   Standing    Number of Occurrences:   50    Standing Expiration Date:   11/22/2022   CBC with Differential/Platelet    Standing Status:   Standing    Number of Occurrences:   50    Standing Expiration Date:   11/22/2022   Ambulatory referral to Genetics    Referral Priority:   Routine    Referral Type:   Consultation    Referral Reason:   Specialty Services Required    Number of Visits Requested:   1   All questions were answered. The patient knows to call the clinic with any problems, questions or concerns. No barriers to learning was detected. The total time spent in the appointment was 25 minutes.     Truitt Merle, MD 11/21/2021   I, Wilburn Mylar, am acting as scribe for Truitt Merle, MD.   I have reviewed the above documentation for accuracy and completeness, and I agree with the above.

## 2021-11-22 ENCOUNTER — Telehealth: Payer: Self-pay | Admitting: Licensed Clinical Social Worker

## 2021-11-22 NOTE — Telephone Encounter (Signed)
Scheduled appt per 8/31 referral. Left msg with appt date/time.

## 2021-12-05 ENCOUNTER — Encounter (HOSPITAL_COMMUNITY): Payer: Self-pay

## 2021-12-05 ENCOUNTER — Ambulatory Visit (HOSPITAL_COMMUNITY)
Admission: RE | Admit: 2021-12-05 | Discharge: 2021-12-05 | Disposition: A | Discharge status: Home / Self Care | Payer: 59 | Source: Ambulatory Visit | Attending: Hematology | Admitting: Hematology

## 2021-12-05 DIAGNOSIS — C49A3 Gastrointestinal stromal tumor of small intestine: Secondary | ICD-10-CM | POA: Insufficient documentation

## 2021-12-05 MED ORDER — IOHEXOL 300 MG/ML  SOLN
100.0000 mL | Freq: Once | INTRAMUSCULAR | Status: AC | PRN
  Administered 2021-12-05: 100 mL via INTRAVENOUS

## 2021-12-05 MED ORDER — SODIUM CHLORIDE (PF) 0.9 % IJ SOLN
INTRAMUSCULAR | Status: AC
  Filled 2021-12-05: qty 50

## 2021-12-13 ENCOUNTER — Encounter: Payer: Self-pay | Admitting: Hematology

## 2021-12-13 ENCOUNTER — Inpatient Hospital Stay: Payer: 59 | Attending: Hematology | Admitting: Hematology

## 2021-12-13 DIAGNOSIS — R911 Solitary pulmonary nodule: Secondary | ICD-10-CM

## 2021-12-13 DIAGNOSIS — C49A3 Gastrointestinal stromal tumor of small intestine: Secondary | ICD-10-CM

## 2021-12-13 NOTE — Progress Notes (Signed)
[place]   [phone] Fax:([phone]) [phone]   Clinic Follow up Note   Patient Care Team: Kathyrn Lass, MD as PCP - General (Family Medicine) Earnstine Regal, PA-C as Physician Assistant (Obstetrics and Gynecology) Excell Seltzer, MD (Inactive) as Consulting Physician (General Surgery) Truitt Merle, MD as Consulting Physician (Medical Oncology) Tania Ade, RN as Registered Nurse (Medical Oncology) Earnstine Regal, PA-C as Physician Assistant (Obstetrics and Gynecology)  Date of Service:  12/13/2021  I connected with Miranda Howe on 12/13/2021 at  3:20 PM EDT by telephone visit and verified that I am speaking with the correct person using two identifiers.  I discussed the limitations, risks, security and privacy concerns of performing an evaluation and management service by telephone and the availability of in person appointments. I also discussed with the patient that there may be a patient responsible charge related to this service. The patient expressed understanding and agreed to proceed.   Other persons participating in the visit and their role in the encounter:  None   Patient's location:  Office  Provider's location:  Home   CHIEF COMPLAINT: f/u of GIST  CURRENT THERAPY:  Surveillance  ASSESSMENT & PLAN:  Miranda Howe is a 51 y.o. female with   1. 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 Completed 3 years adjuvant Gleevec in 06/2017. -she was lost to f/u 03/2017 - 10/2021. -she returned 11/21/21, CBC and CMP from that day were WNL. -CT CAP obtained 12/05/21 showed NED.  Multiple small groundglass change in her lungs are likely related to her COVID in late 2022, will repeat CT chest in 3 months.  She is asymptomatic. -Continue surveillance.   2. Genetics, Cancer screening -she has a strong family history of  various cancers. She is scheduled for genetics 01/20/22. -she continues annual mammograms and pap smears through [facility] -she has not yet had a colonoscopy.   3. Anxiety -Continue Celexa.     Plan -genetics counseling and testing 01/20/22 -lab and f/u in 1 year -CT chest without contrast in 3 months with a phone visit after   No problem-specific Assessment & Plan notes found for this encounter.   SUMMARY OF ONCOLOGIC HISTORY: Oncology History Overview Note  GIST (gastrointestinal stromal tumor) of jejunum with perforation, s/p SB resection 06/07/2014   Staging form: Small Intestine, AJCC 7th Edition     Pathologic stage from 07/03/2014: Stage Unknown (T4, NX, cM0) - Unsigned     GIST (gastrointestinal stromal tumor) of jejunum with perforation, s/p SB resection 06/07/2014  06/07/2014 Initial Diagnosis   GIST (gastrointestinal stromal tumor) of jejunum with perforation, s/p SB resection 06/07/2014   06/07/2014 Imaging   9.3 X 8.4 X 7.5 cm mass left lower quadrant. Recruitment of vasculature about the mass, mild nodularity overlying the adjacent descending colon.   06/07/2014 Pathologic Stage   pT4,pNX / GI:low grade; mitotic rate 5/50   06/07/2014 Surgery   Exploratory laparotomy resection of abdominal mass and portion of small bowel   06/07/2014 Miscellaneous   tumor was tested for c-kit gene mutation, (+) p.C585 deletion/insertion NP. This predicts good response to Tyrosine kinase inhibitor.   07/18/2014 - 06/2017 Chemotherapy   Gleevec 400 mg po   10/16/2016 Imaging   CT CAP W Contrast 10/16/16 IMPRESSION: No evidence of recurrent neoplasm or metastatic disease within the chest, abdomen, or pelvis.  Stable sub-cm right upper lobe  pulmonary nodules, consistent with benign etiology.  Cholelithiasis.  No radiographic evidence of cholecystitis   10/16/2016 Imaging   10/16/2016 CT CAP IMPRESSION: No evidence of recurrent neoplasm or metastatic disease within the chest, abdomen, or  pelvis.  Stable sub-cm right upper lobe pulmonary nodules, consistent with benign etiology.  Cholelithiasis.  No radiographic evidence of cholecystitis.   03/23/2018 Imaging   03/23/2018 CT AP IMPRESSION: 1. No evidence of local recurrence or metastatic disease. 2. No acute abdominopelvic findings. 3. Slight enlargement of left perivaginal cyst, likely a Bartholin cyst.      INTERVAL HISTORY:  Miranda Howe was contacted for a follow up of GIST and to review her recent CT scan findings.  We referred her identities with 2 masses.  She was last seen by me on 11/21/21.  She is clinically doing well, denies any pain or other new concerns.   All other systems were reviewed with the patient and are negative.  MEDICAL HISTORY:  Past Medical History:  Diagnosis Date   Anxiety    Family history of colon cancer    gist dx'd 06/07/14    SURGICAL HISTORY: Past Surgical History:  Procedure Laterality Date   BARTHOLIN GLAND CYST EXCISION  01/17/2009   I&D   CERVICAL POLYPECTOMY  2010   LAPAROTOMY N/A 06/07/2014   Procedure: EXPLORATORY LAPAROTOMY RESECTION OF ABDOMINAL MASS AND PORTION OF SMALL BOWEL;  Surgeon: Excell Seltzer, MD;  Location: [facility];  Service: General;  Laterality: N/A;   WISDOM TOOTH EXTRACTION      I have reviewed the social history and family history with the patient and they are unchanged from previous note.  ALLERGIES:  is allergic to sulfa antibiotics.  MEDICATIONS:  Current Outpatient Medications  Medication Sig Dispense Refill   acetaminophen (TYLENOL) 325 MG tablet Take 2 tablets (650 mg total) by mouth every 6 (six) hours as needed for mild pain, moderate pain, fever or headache.     citalopram (CELEXA) 10 MG tablet Take 10 mg by mouth daily.     ibuprofen (ADVIL,MOTRIN) 200 MG tablet Take 400 mg by mouth every 6 (six) hours as needed for moderate pain.     Multiple Vitamin (MULTIVITAMIN) tablet Take 1 tablet by mouth daily.     No current  facility-administered medications for this visit.    PHYSICAL EXAMINATION: ECOG PERFORMANCE STATUS: 0 - Asymptomatic  There were no vitals filed for this visit. Wt Readings from Last 3 Encounters:  11/21/21 179 lb 4.8 oz (81.3 kg)  03/30/17 179 lb 12.8 oz (81.6 kg)  06/06/16 172 lb 8 oz (78.2 kg)     No vitals taken today, Exam not performed today  LABORATORY DATA:  I have reviewed the data as listed    Latest Ref Rng & Units 11/21/2021    2:44 PM 03/22/2018    2:57 PM 03/30/2017   12:42 PM  CBC  WBC 4.0 - 10.5 K/uL 8.8  7.9  9.0   Hemoglobin 12.0 - 15.0 g/dL 13.4  13.5  12.6   Hematocrit 36.0 - 46.0 % 38.5  40.7  37.9   Platelets 150 - 400 K/uL 291  304  245         Latest Ref Rng & Units 11/21/2021    2:44 PM 03/22/2018    2:57 PM 03/30/2017   12:42 PM  CMP  Glucose 70 - 99 mg/dL 100  94  85   BUN 6 - 20 mg/dL _0 Creatinine  0.44 - 1.00 mg/dL 1.03  0.85  1.11   Sodium 135 - 145 mmol/L 138  139  140   Potassium 3.5 - 5.1 mmol/L 3.8  5.0  4.2   Chloride 98 - 111 mmol/L 106  104  106   CO2 22 - 32 mmol/L _0 Calcium 8.9 - 10.3 mg/dL 9.8  9.8  9.7   Total Protein 6.5 - 8.1 g/dL 7.2  7.1  7.0   Total Bilirubin 0.3 - 1.2 mg/dL 0.5  0.4  0.4   Alkaline Phos 38 - 126 U/L 60  92  73   AST 15 - 41 U/L _1 ALT 0 - 44 U/L _2 RADIOGRAPHIC STUDIES: I have personally reviewed the radiological images as listed and agreed with the findings in the report. No results found.    Orders Placed This Encounter  Procedures   CT CHEST WO CONTRAST    Standing Status:   Future    Order Specific Question:   Is patient pregnant?    Answer:   No    Order Specific Question:   Preferred imaging location?    Answer:   [facility]    Order Specific Question:   Release to patient    Answer:   Immediate   All questions were answered. The patient knows to call the clinic with any problems, questions or concerns. No barriers to learning was  detected. The total time spent in the appointment was 15 minutes.     Truitt Merle, MD 12/13/2021   I, Wilburn Mylar, am acting as scribe for Truitt Merle, MD.   I have reviewed the above documentation for accuracy and completeness, and I agree with the above.

## 2021-12-27 ENCOUNTER — Telehealth: Payer: Self-pay | Admitting: Hematology

## 2021-12-27 NOTE — Telephone Encounter (Signed)
Left message with follow-up appointment per 9/22 los.

## 2022-01-18 ENCOUNTER — Other Ambulatory Visit: Payer: Self-pay | Admitting: Licensed Clinical Social Worker

## 2022-01-18 DIAGNOSIS — C49A3 Gastrointestinal stromal tumor of small intestine: Secondary | ICD-10-CM

## 2022-01-20 ENCOUNTER — Inpatient Hospital Stay (HOSPITAL_BASED_OUTPATIENT_CLINIC_OR_DEPARTMENT_OTHER): Payer: 59 | Admitting: Licensed Clinical Social Worker

## 2022-01-20 ENCOUNTER — Inpatient Hospital Stay: Payer: 59 | Attending: Hematology

## 2022-01-20 DIAGNOSIS — Z1501 Genetic susceptibility to malignant neoplasm of breast: Secondary | ICD-10-CM | POA: Diagnosis not present

## 2022-01-20 DIAGNOSIS — Z800 Family history of malignant neoplasm of digestive organs: Secondary | ICD-10-CM

## 2022-01-20 DIAGNOSIS — Z803 Family history of malignant neoplasm of breast: Secondary | ICD-10-CM

## 2022-01-20 DIAGNOSIS — C49A3 Gastrointestinal stromal tumor of small intestine: Secondary | ICD-10-CM

## 2022-01-20 DIAGNOSIS — Z8049 Family history of malignant neoplasm of other genital organs: Secondary | ICD-10-CM

## 2022-01-20 DIAGNOSIS — Z1379 Encounter for other screening for genetic and chromosomal anomalies: Secondary | ICD-10-CM

## 2022-01-20 LAB — COMPREHENSIVE METABOLIC PANEL
ALT: 23 U/L (ref 0–44)
AST: 20 U/L (ref 15–41)
Albumin: 4.4 g/dL (ref 3.5–5.0)
Alkaline Phosphatase: 75 U/L (ref 38–126)
Anion gap: 6 (ref 5–15)
BUN: 25 mg/dL — ABNORMAL HIGH (ref 6–20)
CO2: 28 mmol/L (ref 22–32)
Calcium: 10 mg/dL (ref 8.9–10.3)
Chloride: 104 mmol/L (ref 98–111)
Creatinine, Ser: 0.89 mg/dL (ref 0.44–1.00)
GFR, Estimated: >60 mL/min (ref >60)
Glucose, Bld: 148 mg/dL — ABNORMAL HIGH (ref 70–99)
Potassium: 4.3 mmol/L (ref 3.5–5.1)
Sodium: 138 mmol/L (ref 135–145)
Total Bilirubin: 0.4 mg/dL (ref 0.3–1.2)
Total Protein: 7.2 g/dL (ref 6.5–8.1)

## 2022-01-20 LAB — CBC WITH DIFFERENTIAL/PLATELET
Abs Immature Granulocytes: 0.04 10*3/uL (ref 0.00–0.07)
Basophils Absolute: 0 10*3/uL (ref 0.0–0.1)
Basophils Relative: 0 %
Eosinophils Absolute: 0.1 10*3/uL (ref 0.0–0.5)
Eosinophils Relative: 1 %
HCT: 40.1 % (ref 36.0–46.0)
Hemoglobin: 13.8 g/dL (ref 12.0–15.0)
Immature Granulocytes: 0 %
Lymphocytes Relative: 29 %
Lymphs Abs: 3 10*3/uL (ref 0.7–4.0)
MCH: 30.1 pg (ref 26.0–34.0)
MCHC: 34.4 g/dL (ref 30.0–36.0)
MCV: 87.4 fL (ref 80.0–100.0)
Monocytes Absolute: 0.5 10*3/uL (ref 0.1–1.0)
Monocytes Relative: 5 %
Neutro Abs: 6.6 10*3/uL (ref 1.7–7.7)
Neutrophils Relative %: 65 %
Platelets: 279 10*3/uL (ref 150–400)
RBC: 4.59 MIL/uL (ref 3.87–5.11)
RDW: 12.9 % (ref 11.5–15.5)
WBC: 10.3 10*3/uL (ref 4.0–10.5)
nRBC: 0 % (ref 0.0–0.2)

## 2022-01-20 LAB — GENETIC SCREENING ORDER

## 2022-01-20 NOTE — Progress Notes (Signed)
REFERRING PROVIDER: Truitt Merle, MD [address] [place],  [place] [address]  PRIMARY PROVIDER:  Kathyrn Lass, MD  PRIMARY REASON FOR VISIT:  1. GIST (gastrointestinal stromal tumor) of jejunum with perforation, s/p SB resection 06/07/2014   2. Genetic testing   3. Family history of breast cancer   4. Family history of colon cancer   5. Family history of uterine cancer   6. Family history of pancreatic cancer      HISTORY OF PRESENT ILLNESS:   Ms. Dunk, a 51 y.o. female, was seen for a [facility] cancer genetics consultation at the request of Dr. Burr Medico due to a family history of cancer and personal history of GIST.  Ms. Joaquin presents to clinic today to discuss the possibility of a hereditary predisposition to cancer, genetic testing, and to further clarify her future cancer risks, as well as potential cancer risks for family members.   In 2016, at the age of 81, Ms. Mosey was diagnosed with gastrointestinal stromal tumor (GIST) of the jejunum. This was treated with surgery and chemotherapy. She had genetic testing at that time [facility]) that was negative/normal.   CANCER HISTORY:  Oncology History Overview Note  GIST (gastrointestinal stromal tumor) of jejunum with perforation, s/p SB resection 06/07/2014   Staging form: Small Intestine, AJCC 7th Edition     Pathologic stage from 07/03/2014: Stage Unknown (T4, NX, cM0) - Unsigned     GIST (gastrointestinal stromal tumor) of jejunum with perforation, s/p SB resection 06/07/2014  06/07/2014 Initial Diagnosis   GIST (gastrointestinal stromal tumor) of jejunum with perforation, s/p SB resection 06/07/2014   06/07/2014 Imaging   9.3 X 8.4 X 7.5 cm mass left lower quadrant. Recruitment of vasculature about the mass, mild nodularity overlying the adjacent descending colon.   06/07/2014 Pathologic Stage   pT4,pNX / GI:low grade; mitotic rate 5/50   06/07/2014 Surgery   Exploratory laparotomy resection of abdominal mass and  portion of small bowel   06/07/2014 Miscellaneous   tumor was tested for c-kit gene mutation, (+) p.G956 deletion/insertion NP. This predicts good response to Tyrosine kinase inhibitor.   07/18/2014 - 06/2017 Chemotherapy   Gleevec 400 mg po   10/16/2016 Imaging   CT CAP W Contrast 10/16/16 IMPRESSION: No evidence of recurrent neoplasm or metastatic disease within the chest, abdomen, or pelvis.  Stable sub-cm right upper lobe pulmonary nodules, consistent with benign etiology.  Cholelithiasis.  No radiographic evidence of cholecystitis   10/16/2016 Imaging   10/16/2016 CT CAP IMPRESSION: No evidence of recurrent neoplasm or metastatic disease within the chest, abdomen, or pelvis.  Stable sub-cm right upper lobe pulmonary nodules, consistent with benign etiology.  Cholelithiasis.  No radiographic evidence of cholecystitis.   03/23/2018 Imaging   03/23/2018 CT AP IMPRESSION: 1. No evidence of local recurrence or metastatic disease. 2. No acute abdominopelvic findings. 3. Slight enlargement of left perivaginal cyst, likely a Bartholin cyst.       Past Medical History:  Diagnosis Date   Anxiety    Family history of colon cancer    gist dx'd 06/07/14    Past Surgical History:  Procedure Laterality Date   BARTHOLIN GLAND CYST EXCISION  01/17/2009   I&D   CERVICAL POLYPECTOMY  2010   LAPAROTOMY N/A 06/07/2014   Procedure: EXPLORATORY LAPAROTOMY RESECTION OF ABDOMINAL MASS AND PORTION OF SMALL BOWEL;  Surgeon: Excell Seltzer, MD;  Location: [facility];  Service: General;  Laterality: N/A;   WISDOM TOOTH EXTRACTION      FAMILY HISTORY:  We obtained a detailed, 4-generation family history.  Significant diagnoses are listed below: Family History  Problem Relation Age of Onset   Heart disease Father    Hypertension Father    Cancer Father 70       colon cancer; currently 35   Other Mother        varicose veins   Cancer Mother 53       breast cancer; currently 87    Hypertension Mother    Diabetes Mother    Lupus Cousin    Cancer Paternal Grandfather        colon cancer (age?); deceased   Cancer Maternal Aunt        uterine cancer May 04, 2065; deceased 61s   Cancer Paternal Uncle        pancreatic; deceased 36s   Cancer Paternal Uncle        colon cancer 46s; currently 45   Cancer Paternal Uncle        colon cancer 85s; currently 8s   Ms. Waide has 1 son, 51 and 1 daughter, 61. She has 3 brothers, 2 sisters. One brother was diagnosed with testicular cancer at 51 and is living at 66.   Ms. Schmeling mother had breast cancer at 44, SCC lung cancer at 61. She Is living at 9. Maternal aunt had uterine at 52 and passed in her 30s.  Ms. Lamarca father had colon cancer at 14 and is living at 70. One uncle had colon in his 65s and died of a recurrence. Another uncle also had colon cancer and is living in his 23s-80s. Another uncle had pancreatic cancer and died in his 70s. Both of his daughters were diagnosed with breast cancer, one at 62, the other at 27. Patient's paternal grandfather had colon cancer and passed in his 43s-50s.   Ms. Thier is unaware of previous family history of genetic testing for hereditary cancer risks. There is no reported Ashkenazi Jewish ancestry. There is no known consanguinity.  GENETIC COUNSELING ASSESSMENT: Ms. Delatte is a 52 y.o. female with a personal history of a GIST and strong family history of colon cancer which is somewhat suggestive of a hereditary cancer syndrome and predisposition to cancer. We, therefore, discussed and recommended the following at today's visit.   DISCUSSION: We discussed that approximately 10% of cancer is hereditary. There are several genes associated with GIST that she has not had tested yet including PDGFRA. There are other genes associated with the other cancers in her family as well, and we also have newer technology (RNA) that could possibly detect a mutation in a gene that was previously missed.  Cancers and risks are gene specific. We discussed that testing is beneficial for several reasons including knowing about cancer risks, identifying potential screening and risk-reduction options that may be appropriate, and to understand if other family members could be at risk for cancer and allow them to undergo genetic testing.   We reviewed the characteristics, features and inheritance patterns of hereditary cancer syndromes. We also discussed genetic testing, including the appropriate family members to test, the process of testing, insurance coverage and turn-around-time for results. We discussed the implications of a negative, positive and/or variant of uncertain significant result. We recommended Ms. Stanczyk pursue genetic testing for the [facility] Multi-Cancer+RNA gene panel.   Based on Ms. Bean's personal and family history of cancer, she meets medical criteria for genetic testing. Despite that she meets criteria, she may still have an out of pocket cost. We discussed that  if her out of pocket cost for testing is over $100, the laboratory will call and confirm whether she wants to proceed with testing.  If the out of pocket cost of testing is less than $100 she will be billed by the genetic testing laboratory.   PLAN: After considering the risks, benefits, and limitations, Ms. Preusser provided informed consent to pursue genetic testing and the blood sample was sent to [facility] for analysis of the Multi-Cancer+RNA panel. Results should be available within approximately 2-3 weeks' time, at which point they will be disclosed by telephone to Ms. Mellor, as will any additional recommendations warranted by these results. Ms. Nightengale will receive a summary of her genetic counseling visit and a copy of her results once available. This information will also be available in Epic.   Ms. Conde questions were answered to her satisfaction today. Our contact information was provided should  additional questions or concerns arise. Thank you for the referral and allowing [place] to share in the care of your patient.   Faith Rogue, MS, [facility] Genetic Counselor [email or web address] Phone: [phone]  The patient was seen for a total of 20 minutes in face-to-face genetic counseling.  Dr. Grayland Ormond was available for discussion regarding this case.   _______________________________________________________________________ For Office Staff:  Number of people involved in session: 1 Was an Intern/ student involved with case: no

## 2022-02-17 ENCOUNTER — Encounter: Payer: Self-pay | Admitting: Hematology

## 2022-02-18 ENCOUNTER — Encounter: Payer: Self-pay | Admitting: Licensed Clinical Social Worker

## 2022-02-25 ENCOUNTER — Telehealth: Payer: Self-pay | Admitting: Licensed Clinical Social Worker

## 2022-02-26 ENCOUNTER — Encounter (HOSPITAL_COMMUNITY): Payer: Self-pay

## 2022-02-26 ENCOUNTER — Ambulatory Visit (HOSPITAL_COMMUNITY)
Admission: RE | Admit: 2022-02-26 | Discharge: 2022-02-26 | Disposition: A | Discharge status: Home / Self Care | Payer: 59 | Source: Ambulatory Visit | Attending: Hematology | Admitting: Hematology

## 2022-02-26 DIAGNOSIS — R911 Solitary pulmonary nodule: Secondary | ICD-10-CM | POA: Diagnosis present

## 2022-02-27 NOTE — Progress Notes (Signed)
Miranda Howe   [phone] Fax:([phone]   Clinic Follow up Note   Patient Care Team: Kathyrn Lass, MD as PCP - General (Family Medicine) Earnstine Regal, PA-C as Physician Assistant (Obstetrics and Gynecology) Excell Seltzer, MD (Inactive) as Consulting Physician (General Surgery) Truitt Merle, MD as Consulting Physician (Medical Oncology) Tania Ade, RN as Registered Nurse (Medical Oncology) Earnstine Regal, PA-C as Physician Assistant (Obstetrics and Gynecology)  Date of Service:  02/28/2022  I connected with [name] on 02/28/2022 at  2:20 PM EST by telephone visit and verified that I am speaking with the correct person using two identifiers.  I discussed the limitations, risks, security and privacy concerns of performing an evaluation and management service by telephone and the availability of in person appointments. I also discussed with the patient that there may be a patient responsible charge related to this service. The patient expressed understanding and agreed to proceed.   Other persons participating in the visit and their role in the encounter:  No  Patient's location:  Work Provider's location:  Office  CHIEF COMPLAINT: f/u of GIST   CURRENT THERAPY:  Surveillance   ASSESSMENT & PLAN:  Miranda Howe is a 51 y.o. female with   1. 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 Completed 3 years adjuvant Gleevec in 06/2017. -she was lost to f/u 03/2017 - 10/2021. -she returned 11/21/21, CBC and CMP from that day were WNL. -CT CAP obtained 12/05/21 showed NED.  Multiple small groundglass change in her lungs are likely related to her COVID in late 2022 -Repeated CT scan from yesterday showed resolved groundglass change in the right lower lobe, and new small millimeter lung nodule, and is stable 4 mm nodule  in the right lower lobe, overall likely benign.  Plan to repeat a CT scan in 3 months for follow-up. -Continue surveillance.   2. Genetics, Cancer screening -she has a strong family history of various cancers. She is scheduled for genetics 01/20/22. -she continues annual mammograms and pap smears through [facility] -she has not yet had a colonoscopy. -She had repeated genetic testing, results still pending.   3. Anxiety -Continue Celexa.     PLAN: - Discuss CT Scan findings  -f/u, lab and CT chest in 4mh   SUMMARY OF ONCOLOGIC HISTORY: Oncology History Overview Note  GIST (gastrointestinal stromal tumor) of jejunum with perforation, s/p SB resection 06/07/2014   Staging form: Small Intestine, AJCC 7th Edition     Pathologic stage from 07/03/2014: Stage Unknown (T4, NX, cM0) - Unsigned     GIST (gastrointestinal stromal tumor) of jejunum with perforation, s/p SB resection 06/07/2014  06/07/2014 Initial Diagnosis   GIST (gastrointestinal stromal tumor) of jejunum with perforation, s/p SB resection 06/07/2014   06/07/2014 Imaging   9.3 X 8.4 X 7.5 cm mass left lower quadrant. Recruitment of vasculature about the mass, mild nodularity overlying the adjacent descending colon.   06/07/2014 Pathologic Stage   pT4,pNX / GI:low grade; mitotic rate 5/50   06/07/2014 Surgery   Exploratory laparotomy resection of abdominal mass and portion of small bowel   06/07/2014 Miscellaneous   tumor was tested for c-kit gene mutation, (+) p.KZ329deletion/insertion NP. This predicts good response to Tyrosine kinase inhibitor.   07/18/2014 - 06/2017 Chemotherapy   Gleevec 400 mg po   10/16/2016 Imaging   CT CAP W Contrast 10/16/16 IMPRESSION:  No evidence of recurrent neoplasm or metastatic disease within the chest, abdomen, or pelvis.  Stable sub-cm right upper lobe pulmonary nodules, consistent with benign etiology.  Cholelithiasis.  No radiographic evidence of cholecystitis   10/16/2016 Imaging    10/16/2016 CT CAP IMPRESSION: No evidence of recurrent neoplasm or metastatic disease within the chest, abdomen, or pelvis.  Stable sub-cm right upper lobe pulmonary nodules, consistent with benign etiology.  Cholelithiasis.  No radiographic evidence of cholecystitis.   03/23/2018 Imaging   03/23/2018 CT AP IMPRESSION: 1. No evidence of local recurrence or metastatic disease. 2. No acute abdominopelvic findings. 3. Slight enlargement of left perivaginal cyst, likely a Bartholin cyst.      INTERVAL HISTORY:  AALANI Howe was contacted for a follow up of GIST .She was last seen by me on 12/13/2021. Pt states a family member were diagnose with Pancreatic Cancer on Thanksgiving. Pt states she is trying to stay on top of her,because of  Family Cancer History.   All other systems were reviewed with the patient and are negative.  MEDICAL HISTORY:  Past Medical History:  Diagnosis Date   Anxiety    Family history of colon cancer    gist dx'd 06/07/14    SURGICAL HISTORY: Past Surgical History:  Procedure Laterality Date   BARTHOLIN GLAND CYST EXCISION  01/17/2009   I&D   CERVICAL POLYPECTOMY  2010   LAPAROTOMY N/A 06/07/2014   Procedure: EXPLORATORY LAPAROTOMY RESECTION OF ABDOMINAL MASS AND PORTION OF SMALL BOWEL;  Surgeon: Excell Seltzer, MD;  Location: [facility];  Service: General;  Laterality: N/A;   WISDOM TOOTH EXTRACTION      I have reviewed the social history and family history with the patient and they are unchanged from previous note.  ALLERGIES:  is allergic to sulfa antibiotics.  MEDICATIONS:  Current Outpatient Medications  Medication Sig Dispense Refill   acetaminophen (TYLENOL) 325 MG tablet Take 2 tablets (650 mg total) by mouth every 6 (six) hours as needed for mild pain, moderate pain, fever or headache.     citalopram (CELEXA) 10 MG tablet Take 10 mg by mouth daily.     ibuprofen (ADVIL,MOTRIN) 200 MG tablet Take 400 mg by mouth every 6 (six) hours as  needed for moderate pain.     Multiple Vitamin (MULTIVITAMIN) tablet Take 1 tablet by mouth daily.     No current facility-administered medications for this visit.    PHYSICAL EXAMINATION: ECOG PERFORMANCE STATUS: 0 - Asymptomatic  There were no vitals filed for this visit. Wt Readings from Last 3 Encounters:  11/21/21 179 lb 4.8 oz (81.3 kg)  03/30/17 179 lb 12.8 oz (81.6 kg)  06/06/16 172 lb 8 oz (78.2 kg)     No vitals taken today, Exam not performed today  LABORATORY DATA:  I have reviewed the data as listed    Latest Ref Rng & Units 01/20/2022    2:13 PM 11/21/2021    2:44 PM 03/22/2018    2:57 PM  CBC  WBC 4.0 - 10.5 K/uL 10.3  8.8  7.9   Hemoglobin 12.0 - 15.0 g/dL 13.8  13.4  13.5   Hematocrit 36.0 - 46.0 % 40.1  38.5  40.7   Platelets 150 - 400 K/uL 279  291  304         Latest Ref Rng & Units 01/20/2022    2:13 PM 11/21/2021    2:44 PM 03/22/2018    2:57 PM  CMP  Glucose 70 - 99 mg/dL  148  100  94   BUN 6 - 20 mg/dL _0 Creatinine 0.44 - 1.00 mg/dL 0.89  1.03  0.85   Sodium 135 - 145 mmol/L 138  138  139   Potassium 3.5 - 5.1 mmol/L 4.3  3.8  5.0   Chloride 98 - 111 mmol/L 104  106  104   CO2 22 - 32 mmol/L _1 Calcium 8.9 - 10.3 mg/dL 10.0  9.8  9.8   Total Protein 6.5 - 8.1 g/dL 7.2  7.2  7.1   Total Bilirubin 0.3 - 1.2 mg/dL 0.4  0.5  0.4   Alkaline Phos 38 - 126 U/L 75  60  92   AST 15 - 41 U/L _2 ALT 0 - 44 U/L _3 RADIOGRAPHIC STUDIES: I have personally reviewed the radiological images as listed and agreed with the findings in the report. CT CHEST WO CONTRAST  Result Date: 02/28/2022 CLINICAL DATA:  Pulmonary nodule. History of GI stromal tumor. * Tracking Code: BO * EXAM: CT CHEST WITHOUT CONTRAST TECHNIQUE: Multidetector CT imaging of the chest was performed following the standard protocol without IV contrast. RADIATION DOSE REDUCTION: This exam was performed according to the departmental  dose-optimization program which includes automated exposure control, adjustment of the mA and/or kV according to patient size and/or use of iterative reconstruction technique. COMPARISON:  12/05/2021 FINDINGS: Cardiovascular: The heart size is normal. No substantial pericardial effusion. No thoracic aortic aneurysm. No substantial atherosclerosis of the thoracic aorta. Mediastinum/Nodes: No mediastinal lymphadenopathy. No evidence for gross hilar lymphadenopathy although assessment is limited by the lack of intravenous contrast on the current study. Tiny hiatal hernia. The esophagus has normal imaging features. There is no axillary lymphadenopathy. Tiny left subpectoral lymph nodes are stable. Lungs/Pleura: 4 mm posterior right lower lobe perifissural nodule identified previously is stable on 41/7 today. This nodule appears to have calcification is reportedly stable back to 2018, likely calcified granuloma. Tiny paraspinal right lower lobe nodule measuring 3 mm on 66/7 is new in the interval. The 8 mm ground-glass opacity seen posterior subpleural right lower lobe previously has resolved in the interval. Adjacent ground-glass opacities in the left upper lobe noted previously have also resolved. No focal airspace consolidation. There is no evidence of pleural effusion. Upper Abdomen: Unremarkable. Musculoskeletal: No worrisome lytic or sclerotic osseous abnormality. IMPRESSION: 1. Interval resolution of the bilateral ground-glass opacities seen previously in the posterior subpleural right lower lobe and left upper lobe. 2. Tiny paraspinal right lower lobe nodule measuring 3 mm is new in the interval. This is likely infectious/inflammatory. Given the history of GI stromal tumor, follow-up CT chest in 3 months recommended to ensure resolution. 3. Stable 4 mm posterior right lower lobe perifissural nodule, likely calcified granuloma. 4. Tiny hiatal hernia. Electronically Signed   By: Misty Stanley M.D.   On: 02/28/2022  07:50      Orders Placed This Encounter  Procedures   CT CHEST WO CONTRAST    Standing Status:   Future    Standing Expiration Date:   03/01/2023    Order Specific Question:   Is patient pregnant?    Answer:   No    Order Specific Question:   Preferred imaging location?    Answer:   [facility]    Order Specific Question:   Release to patient  Answer:   Immediate   All questions were answered. The patient knows to call the clinic with any problems, questions or concerns. No barriers to learning was detected. The total time spent in the appointment was 22 minutes.     Truitt Merle, MD 02/28/2022   Felicity Coyer am acting as scribe for Truitt Merle, MD.   I have reviewed the above documentation for accuracy and completeness, and I agree with the above.

## 2022-02-28 ENCOUNTER — Inpatient Hospital Stay: Payer: 59 | Attending: Hematology | Admitting: Hematology

## 2022-02-28 ENCOUNTER — Encounter: Payer: Self-pay | Admitting: Hematology

## 2022-02-28 DIAGNOSIS — Z1379 Encounter for other screening for genetic and chromosomal anomalies: Secondary | ICD-10-CM | POA: Diagnosis not present

## 2022-02-28 DIAGNOSIS — C49A3 Gastrointestinal stromal tumor of small intestine: Secondary | ICD-10-CM | POA: Diagnosis not present

## 2022-02-28 NOTE — Assessment & Plan Note (Signed)
-  low grade, pT4NXM0, stage IIIA vs IIIB, 10cm mass with perforation, cKIT mutation (+)  -diagnosed 2016, s/p resection 06/07/14 showing 10.2 cm GIST. Completed 3 years adjuvant Gleevec in 06/2017. -she was lost to f/u 03/2017 - 10/2021. -she returned 11/21/21, CBC and CMP from that day were WNL. -CT CAP obtained 12/05/21 showed NED.  Multiple small groundglass change in her lungs are likely related to her COVID in late 2022

## 2022-02-28 NOTE — Telephone Encounter (Signed)
I contacted Ms. Kamel to discuss her genetic testing results. 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 No other pathogenic variants were identified in the 84 genes analyzed. Detailed clinic note to follow.   The test report has been scanned into EPIC and is located under the Molecular Pathology section of the Results Review tab.  A portion of the result report is included below for reference.      Faith Rogue, MS, [facility] Genetic Counselor [email or web address] Phone: [phone]

## 2022-03-03 ENCOUNTER — Telehealth: Payer: Self-pay | Admitting: Hematology

## 2022-03-03 NOTE — Telephone Encounter (Signed)
Called patient to notify of new appointments. Patient notified.  

## 2022-03-04 ENCOUNTER — Ambulatory Visit: Payer: Self-pay | Admitting: Licensed Clinical Social Worker

## 2022-03-04 DIAGNOSIS — Z1379 Encounter for other screening for genetic and chromosomal anomalies: Secondary | ICD-10-CM

## 2022-03-04 NOTE — Progress Notes (Signed)
HPI:   Miranda Howe was previously seen in the [place] clinic due to a personal and family history of cancer and concerns regarding a hereditary predisposition to cancer. Please refer to our prior cancer genetics clinic note for more information regarding our discussion, assessment and recommendations, at the time. Miranda Howe recent genetic test results were disclosed to her, as were recommendations warranted by these results. These results and recommendations are discussed in more detail below.  CANCER HISTORY:  Oncology History Overview Note  GIST (gastrointestinal stromal tumor) of jejunum with perforation, s/p SB resection 06/07/2014   Staging form: Small Intestine, AJCC 7th Edition     Pathologic stage from 07/03/2014: Stage Unknown (T4, NX, cM0) - Unsigned     GIST (gastrointestinal stromal tumor) of jejunum with perforation, s/p SB resection 06/07/2014  06/07/2014 Initial Diagnosis   GIST (gastrointestinal stromal tumor) of jejunum with perforation, s/p SB resection 06/07/2014   06/07/2014 Imaging   9.3 X 8.4 X 7.5 cm mass left lower quadrant. Recruitment of vasculature about the mass, mild nodularity overlying the adjacent descending colon.   06/07/2014 Pathologic Stage   pT4,pNX / GI:low grade; mitotic rate 5/50   06/07/2014 Surgery   Exploratory laparotomy resection of abdominal mass and portion of small bowel   06/07/2014 Miscellaneous   tumor was tested for c-kit gene mutation, (+) p.W803 deletion/insertion NP. This predicts good response to Tyrosine kinase inhibitor.   07/18/2014 - 06/2017 Chemotherapy   Gleevec 400 mg po   10/16/2016 Imaging   CT CAP W Contrast 10/16/16 IMPRESSION: No evidence of recurrent neoplasm or metastatic disease within the chest, abdomen, or pelvis.  Stable sub-cm right upper lobe pulmonary nodules, consistent with benign etiology.  Cholelithiasis.  No radiographic evidence of cholecystitis   10/16/2016 Imaging   10/16/2016 CT  CAP IMPRESSION: No evidence of recurrent neoplasm or metastatic disease within the chest, abdomen, or pelvis.  Stable sub-cm right upper lobe pulmonary nodules, consistent with benign etiology.  Cholelithiasis.  No radiographic evidence of cholecystitis.   03/23/2018 Imaging   03/23/2018 CT AP IMPRESSION: 1. No evidence of local recurrence or metastatic disease. 2. No acute abdominopelvic findings. 3. Slight enlargement of left perivaginal cyst, likely a Bartholin cyst.     FAMILY HISTORY:  We obtained a detailed, 4-generation family history.  Significant diagnoses are listed below: Family History  Problem Relation Age of Onset   Heart disease Father    Hypertension Father    Cancer Father 60       colon cancer; currently 78   Other Mother        varicose veins   Cancer Mother 76       breast cancer; currently 38   Hypertension Mother    Diabetes Mother    Lupus Cousin    Cancer Paternal Grandfather        colon cancer (age?); deceased   Cancer Maternal Aunt        uterine cancer 05-28-2065; deceased 50s   Cancer Paternal Uncle        pancreatic; deceased 56s   Cancer Paternal Uncle        colon cancer 77s; currently 9   Cancer Paternal Uncle        colon cancer 48s; currently 20s   Miranda Howe has 1 son, 71 and 1 daughter, 60. She has 3 brothers, 2 sisters. One brother was diagnosed with testicular cancer at 65 and is living at 59.    Miranda Howe mother had  breast cancer at 9, SCC lung cancer at 65. She Is living at 81. Maternal aunt had uterine at 37 and passed in her 55s.   Miranda Howe father had colon cancer at 39 and is living at 78. One uncle had colon in his 2s and died of a recurrence. Another uncle also had colon cancer and is living in his 5s-80s. Another uncle had pancreatic cancer and died in his 74s. Both of his daughters were diagnosed with breast cancer, one at 28, the other at 22. Patient's paternal grandfather had colon cancer and passed in his  55s-50s.   Miranda Howe is unaware of previous family history of genetic testing for hereditary cancer risks. There is no reported Ashkenazi Jewish ancestry. There is no known consanguinity.   GENETIC TEST RESULTS:  Single pathogenic mutation in MUTYH called c.1187G>A (p.Gly396Asp) identified again (MUTYH carrier) on [facility] Multi-Cancer+RNA panel. No other pathogenic variants identified. The report date is 02/22/2022.  The Multi-Cancer + RNA Panel offered by [facility] includes sequencing and/or deletion/duplication analysis of the following 84 genes:AIP, ALK, APC*, ATM*, AXIN2, BAP1, BARD1, BLM, BMPR1A, BRCA1, BRCA2, BRIP1, CASR, CDC73, CDH1, CDK4, CDKN1B, CDKN1C, CDKN2A (p14ARF), CDKN2A (p16INK4a), CEBPA, CHEK2, CTNNA1, DICER1*, DIS3L2, EGFR, EPCAM*, FH*, FLCN, GATA2, GPC3*, GREM1*, HOXB13, HRAS,KIT, MAX*, MEN1*, MET*, MITF*, MLH1*, MSH2*, MSH3*, MSH6*, MUTYH, NBN, NF1*, NF2, NTHL1, PALB2, PDGFRA, PHOX2B*, PMS2*, POLD1*, POLE, POT1, PRKAR1A, PTCH1, PTEN*, RAD50, RAD51C, RAD51D, RB1*, RECQL4*, RET, RUNX1, SDHA*, SDHAF2, SDHB, SDHC*, SDHD, SMAD4, SMARCA4, SMARCB1, SMARCE1, STK11, SUFU, TERC, TERT, TMEM127, TP53, TSC1*, TSC2,VHL, WRN*, WT1.  The test report has been scanned into EPIC and is located under the Molecular Pathology section of the Results Review tab.  A portion of the result report is included below for reference.     Even though a pathogenic variant was not identified that could explain the history of cancer, possible explanations for the cancer in the family may include: There may be no hereditary risk for cancer in the family. The cancers in Miranda Howe and/or her family may be sporadic/familial or due to other genetic and environmental factors. There may be a gene mutation in one of these genes that current testing methods cannot detect but that chance is small. There could be another gene that has not yet been discovered, or that we have not yet tested, that is responsible for the cancer  diagnoses in the family.  It is also possible there is a hereditary cause for the cancer in the family that Miranda Howe did not inherit.  Therefore, it is important to remain in touch with cancer genetics in the future so that we can continue to offer Miranda Howe the most up to date genetic testing.   MUTYH-Associated Polyposis (MAP) Carrier  MAP occurs in individuals with two pathogenic variants in MUTYH--one in each copy of their MUTYH genes. Individuals with a single MUTYH pathogenic variant are not affected with MAP. Several studies have indicated that the presence of a single pathogenic variant may modify lifetime cancer risks. The risk for colon cancer is not well defined, but large population studies have demonstrated that the risk is modestly increased over the general population (PMID: 38756433).   Individuals with a single pathogenic variant in the MUTYH gene are considered carriers of MAP. If only one parent is an unaffected carrier, he or she has a 50% risk of passing the pathogenic variant on to each child. If both parents are unaffected carriers, each of their children has a 1 in 4, or  25%, chance of having MAP.  If an individual is unaffected by colon cancer and has no family history of colon cancer, data are uncertain regarding whether specialized screening is warranted. Since she does have a first degree relative with colon cancer, colonoscopies at least every 5 years are warranted per NCCN v1.2023 Colorectal Cancer Screening guidelines.   An individual's cancer risk and medical management are not determined by genetic test results alone. Overall cancer risk assessment incorporates additional factors, including personal medical history, family history, and any available genetic information that may result in a personalized plan for cancer prevention and surveillance.  Knowing if pathogenic variants in MUTYH are present is advantageous. At-risk relatives can be identified, enabling  pursuit of a diagnostic evaluation. Furthermore, the available information regarding hereditary cancer susceptibility genes is constantly evolving and more clinically relevant data regarding MUTYH are likely to become available in the near future. Awareness of this cancer predisposition encourages patients and their providers to inform at-risk family members, to diligently follow recommended screening protocols, and to be vigilant in maintaining close and regular contact with their local genetics clinic in anticipation of new information.  RECOMMENDATIONS FOR FAMILY MEMBERS:   We recommend all of Miranda Howe's family members have genetic counseling and genetic testing. Because the MUTYH pathogenic variant has been identified in her we can offer genetic testing to relatives to determine if they also carry the MUTYH familial mutation.  As discussed above, having only 1 MUTYH mutation would not significantly impact their colon cancer risk/screening. However, if any relatives have inherited 2 MUTYH mutations (in trans), this would significantly impact their cancer risk.  About 1-2% of the [place] population carries a single MUTYH pathogenic variant, so there is a chance some relatives may have inherited MUTYH mutations from both of their parents.  We would be happy to meet with any of the family members or refer them to a genetic counselor in their local area.  To locate genetic counselors in other cities, individuals can visit the website of the [employer] (ArtistMovie.se) and Secretary/administrator for a Social worker by zip code.  ADDITIONAL GENETIC TESTING:  We discussed with Miranda Howe that her genetic testing was fairly extensive.  If there are additional relevant genes identified to increase cancer risk that can be analyzed in the future, we would be happy to discuss and coordinate this testing at that time.    CANCER SCREENING RECOMMENDATIONS:  We have not identified a hereditary cause  for her personal and family history of cancer at this time.   An individual's cancer risk and medical management are not determined by genetic test results alone. Overall cancer risk assessment incorporates additional factors, including personal medical history, family history, and any available genetic information that may result in a personalized plan for cancer prevention and surveillance. Therefore, it is recommended she continue to follow the cancer management and screening guidelines provided by her oncology and primary healthcare provider.  RECOMMENDATIONS FOR FAMILY MEMBERS:   Individuals in this family might be at some increased risk of developing cancer, over the general population risk, due to the family history of cancer.  Individuals in the family should notify their providers of the family history of cancer. We recommend women in this family have a yearly mammogram beginning at age 19, or 35 years younger than the earliest onset of cancer, an annual clinical breast exam, and perform monthly breast self-exams.  Family members should have colonoscopies by at age 82, or earlier,  as recommended by their providers. Other members of the family may still carry a pathogenic variant in one of these genes that Ms. Doleman did not inherit. Based on the family history, we recommend her paternal relatives, especially those who have had cancer, have genetic counseling and testing. As listed above, we also recommend testing for the MUTYH pathogenic variant identified.  Miranda Howe will let us know if we can be of any assistance in coordinating genetic counseling and/or testing for family members.   FOLLOW-UP:  Lastly, we discussed with Miranda Howe that cancer genetics is a rapidly advancing field and it is possible that new genetic tests will be appropriate for her and/or her family members in the future. We encouraged her to remain in contact with cancer genetics on an annual basis so we can update her  personal and family histories and let her know of advances in cancer genetics that may benefit this family.   Our contact number was provided. Miranda Howe questions were answered to her satisfaction, and she knows she is welcome to call us at anytime with additional questions or concerns.    Miranda Rogue, MS, [facility] Genetic Counselor [email or web address] Phone: [phone]

## 2022-05-29 ENCOUNTER — Ambulatory Visit (HOSPITAL_COMMUNITY)
Admission: RE | Admit: 2022-05-29 | Discharge: 2022-05-29 | Disposition: A | Discharge status: Home / Self Care | Payer: 59 | Source: Ambulatory Visit | Attending: Hematology | Admitting: Hematology

## 2022-05-29 DIAGNOSIS — C49A3 Gastrointestinal stromal tumor of small intestine: Secondary | ICD-10-CM | POA: Diagnosis present

## 2022-06-03 ENCOUNTER — Telehealth: Payer: Self-pay | Admitting: Hematology

## 2022-06-03 NOTE — Telephone Encounter (Signed)
Contacted patient to scheduled appointments. Left message with appointment details and a call back number if patient had any questions or could not accommodate the time we provided.   

## 2022-06-04 ENCOUNTER — Inpatient Hospital Stay: Payer: 59

## 2022-06-04 ENCOUNTER — Inpatient Hospital Stay: Payer: 59 | Admitting: Hematology

## 2022-06-04 ENCOUNTER — Other Ambulatory Visit: Payer: 59

## 2022-06-04 ENCOUNTER — Ambulatory Visit: Payer: 59 | Admitting: Hematology

## 2022-06-05 ENCOUNTER — Encounter: Payer: Self-pay | Admitting: Hematology

## 2022-06-05 ENCOUNTER — Inpatient Hospital Stay: Payer: 59 | Attending: Hematology

## 2022-06-05 ENCOUNTER — Inpatient Hospital Stay: Payer: 59 | Admitting: Hematology

## 2022-06-05 NOTE — Assessment & Plan Note (Deleted)
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 Completed 3 years adjuvant Gleevec in 06/2017. -she was lost to f/u 03/2017 - 10/2021. -she returned 11/21/21, CBC and CMP from that day were WNL. -CT CAP obtained 12/05/21 showed NED.  Multiple small groundglass change in her lungs are likely related to her COVID in late 2022 -Repeated CT scan from yesterday showed resolved groundglass change in the right lower lobe, and new small millimeter lung nodule, and is stable 4 mm nodule in the right lower lobe, overall likely benign.  -repeated CT  on 05/31/2022 showed resolution of the 3-4 mm nodule seen in the RIGHT lower lobe on the prior exam and stable other lung changes, likely benign  -Continue surveillance.

## 2022-06-05 NOTE — Progress Notes (Deleted)
[place]   Telephone:(336) [phone] Fax:(336) [phone]   Clinic Follow up Note   Patient Care Team: Kathyrn Lass, MD as PCP - General (Family Medicine) Earnstine Regal, PA-C as Physician Assistant (Obstetrics and Gynecology) Excell Seltzer, MD (Inactive) as Consulting Physician (General Surgery) Truitt Merle, MD as Consulting Physician (Medical Oncology) Tania Ade, RN as Registered Nurse (Medical Oncology) Earnstine Regal, PA-C as Physician Assistant (Obstetrics and Gynecology)  Date of Service:  06/05/2022  CHIEF COMPLAINT: f/u of  GIST    CURRENT THERAPY: Surveillance     ASSESSMENT: *** Miranda Howe is a 52 y.o. female with   No problem-specific Assessment & Plan notes found for this encounter.  ***   PLAN:   SUMMARY OF ONCOLOGIC HISTORY: Oncology History Overview Note  GIST (gastrointestinal stromal tumor) of jejunum with perforation, s/p SB resection 06/07/2014   Staging form: Small Intestine, AJCC 7th Edition     Pathologic stage from 07/03/2014: Stage Unknown (T4, NX, cM0) - Unsigned     GIST (gastrointestinal stromal tumor) of jejunum with perforation, s/p SB resection 06/07/2014  06/07/2014 Initial Diagnosis   GIST (gastrointestinal stromal tumor) of jejunum with perforation, s/p SB resection 06/07/2014   06/07/2014 Imaging   9.3 X 8.4 X 7.5 cm mass left lower quadrant. Recruitment of vasculature about the mass, mild nodularity overlying the adjacent descending colon.   06/07/2014 Pathologic Stage   pT4,pNX / GI:low grade; mitotic rate 5/50   06/07/2014 Surgery   Exploratory laparotomy resection of abdominal mass and portion of small bowel   06/07/2014 Miscellaneous   tumor was tested for c-kit gene mutation, (+) p.[id number] deletion/insertion NP. This predicts good response to Tyrosine kinase inhibitor.   07/18/2014 - 06/2017 Chemotherapy   Gleevec 400 mg po   10/16/2016 Imaging   CT CAP W Contrast 10/16/16 IMPRESSION: No evidence of  recurrent neoplasm or metastatic disease within the chest, abdomen, or pelvis.  Stable sub-cm right upper lobe pulmonary nodules, consistent with benign etiology.  Cholelithiasis.  No radiographic evidence of cholecystitis   10/16/2016 Imaging   10/16/2016 CT CAP IMPRESSION: No evidence of recurrent neoplasm or metastatic disease within the chest, abdomen, or pelvis.  Stable sub-cm right upper lobe pulmonary nodules, consistent with benign etiology.  Cholelithiasis.  No radiographic evidence of cholecystitis.   03/23/2018 Imaging   03/23/2018 CT AP IMPRESSION: 1. No evidence of local recurrence or metastatic disease. 2. No acute abdominopelvic findings. 3. Slight enlargement of left perivaginal cyst, likely a Bartholin cyst.      INTERVAL HISTORY: *** Miranda Howe is here for a follow up of   GIST  She was last seen by me on 02/28/2022 She presents to the clinic    All other systems were reviewed with the patient and are negative.  MEDICAL HISTORY:  Past Medical History:  Diagnosis Date   Anxiety    Family history of colon cancer    gist dx'd 06/07/14    SURGICAL HISTORY: Past Surgical History:  Procedure Laterality Date   BARTHOLIN GLAND CYST EXCISION  01/17/2009   I&D   CERVICAL POLYPECTOMY  2010   LAPAROTOMY N/A 06/07/2014   Procedure: EXPLORATORY LAPAROTOMY RESECTION OF ABDOMINAL MASS AND PORTION OF SMALL BOWEL;  Surgeon: Excell Seltzer, MD;  Location: [facility];  Service: General;  Laterality: N/A;   WISDOM TOOTH EXTRACTION      I have reviewed the social history and family history with the patient and they are unchanged from previous note.  ALLERGIES:  is allergic to sulfa antibiotics.  MEDICATIONS:  Current Outpatient Medications  Medication Sig Dispense Refill   acetaminophen (TYLENOL) 325 MG tablet Take 2 tablets (650 mg total) by mouth every 6 (six) hours as needed for mild pain, moderate pain, fever or headache.     citalopram (CELEXA) 10 MG tablet  Take 10 mg by mouth daily.     ibuprofen (ADVIL,MOTRIN) 200 MG tablet Take 400 mg by mouth every 6 (six) hours as needed for moderate pain.     Multiple Vitamin (MULTIVITAMIN) tablet Take 1 tablet by mouth daily.     No current facility-administered medications for this visit.    PHYSICAL EXAMINATION: ECOG PERFORMANCE STATUS: {CHL ONC ECOG [id number]}  There were no vitals filed for this visit. Wt Readings from Last 3 Encounters:  11/21/21 179 lb 4.8 oz (81.3 kg)  03/30/17 179 lb 12.8 oz (81.6 kg)  06/06/16 172 lb 8 oz (78.2 kg)    {Only keep what was examined. If exam not performed, can use .CEXAM } GENERAL:alert, no distress and comfortable SKIN: skin color, texture, turgor are normal, no rashes or significant lesions EYES: normal, Conjunctiva are pink and non-injected, sclera clear {OROPHARYNX:no exudate, no erythema and lips, buccal mucosa, and tongue normal}  NECK: supple, thyroid normal size, non-tender, without nodularity LYMPH:  no palpable lymphadenopathy in the cervical, axillary {or inguinal} LUNGS: clear to auscultation and percussion with normal breathing effort HEART: regular rate & rhythm and no murmurs and no lower extremity edema ABDOMEN:abdomen soft, non-tender and normal bowel sounds Musculoskeletal:no cyanosis of digits and no clubbing  NEURO: alert & oriented x 3 with fluent speech, no focal motor/sensory deficits  LABORATORY DATA:  I have reviewed the data as listed    Latest Ref Rng & Units 01/20/2022    2:13 PM 11/21/2021    2:44 PM 03/22/2018    2:57 PM  CBC  WBC 4.0 - 10.5 K/uL 10.3  8.8  7.9   Hemoglobin 12.0 - 15.0 g/dL 13.8  13.4  13.5   Hematocrit 36.0 - 46.0 % 40.1  38.5  40.7   Platelets 150 - 400 K/uL 279  291  304         Latest Ref Rng & Units 01/20/2022    2:13 PM 11/21/2021    2:44 PM 03/22/2018    2:57 PM  CMP  Glucose 70 - 99 mg/dL 148  100  94   BUN 6 - 20 mg/dL '25  26  14   '$ Creatinine 0.44 - 1.00 mg/dL 0.89  1.03  0.85    Sodium 135 - 145 mmol/L 138  138  139   Potassium 3.5 - 5.1 mmol/L 4.3  3.8  5.0   Chloride 98 - 111 mmol/L 104  106  104   CO2 22 - 32 mmol/L '28  26  27   '$ Calcium 8.9 - 10.3 mg/dL 10.0  9.8  9.8   Total Protein 6.5 - 8.1 g/dL 7.2  7.2  7.1   Total Bilirubin 0.3 - 1.2 mg/dL 0.4  0.5  0.4   Alkaline Phos 38 - 126 U/L 75  60  92   AST 15 - 41 U/L '20  20  17   '$ ALT 0 - 44 U/L '23  21  19       '$ RADIOGRAPHIC STUDIES: I have personally reviewed the radiological images as listed and agreed with the findings in the report. No results found.    No orders of the defined types were  placed in this encounter.  All questions were answered. The patient knows to call the clinic with any problems, questions or concerns. No barriers to learning was detected. The total time spent in the appointment was {CHL ONC TIME VISIT - [id number].     Baldemar Friday, CMA 06/05/2022   I, Audry Riles, CMA, am acting as scribe for Truitt Merle, MD.   {Add scribe attestation statement}

## 2022-06-11 ENCOUNTER — Telehealth: Payer: Self-pay | Admitting: Hematology

## 2022-06-11 NOTE — Telephone Encounter (Signed)
Called patient to rescheduled missed appointments. Left message for patient to call back to schedule appointment.

## 2022-11-13 ENCOUNTER — Telehealth: Payer: Self-pay

## 2022-11-13 ENCOUNTER — Other Ambulatory Visit: Payer: Self-pay

## 2022-11-13 DIAGNOSIS — R918 Other nonspecific abnormal finding of lung field: Secondary | ICD-10-CM

## 2022-11-13 DIAGNOSIS — C49A3 Gastrointestinal stromal tumor of small intestine: Secondary | ICD-10-CM

## 2022-11-13 NOTE — Telephone Encounter (Signed)
Pt called wanting to know if Dr. Mosetta Putt wanted the pt to have another CT Scan prior to her f/u appt scheduled on 11/20/2022.  Reviewed Dr. Latanya Maudlin last office note and the pt was last seen in December 2023 and the plan was for the pt to have lab, CT Scan in 3 months, and OV.  Pt had CT Scan in March 2024.  No repeat CT scan was ordered.  Informed pt that Dr. Mosetta Putt did not order a repeat CT Scan but this nurse w/check with Dr. Mosetta Putt.  Spoke with Dr. Mosetta Putt and verbal order w/readback given for CT Chest wo contrast ordered for 11/25/2022.  Notified pt of the order for CT Chest and stated that pt's f/u visit on 11/20/2022 will be rescheduled until after the CT Chest is completed.  Pt verbalized understanding and had no further questions or concerns.

## 2022-11-17 ENCOUNTER — Telehealth: Payer: Self-pay | Admitting: Hematology

## 2022-11-20 ENCOUNTER — Inpatient Hospital Stay: Payer: 59

## 2022-11-20 ENCOUNTER — Ambulatory Visit: Payer: 59 | Admitting: Hematology

## 2022-11-20 ENCOUNTER — Inpatient Hospital Stay: Payer: 59 | Admitting: Hematology

## 2022-11-20 ENCOUNTER — Other Ambulatory Visit: Payer: 59

## 2022-11-25 ENCOUNTER — Inpatient Hospital Stay: Payer: 59 | Attending: Hematology

## 2022-11-25 ENCOUNTER — Ambulatory Visit (HOSPITAL_COMMUNITY): Payer: 59

## 2022-11-25 ENCOUNTER — Encounter (HOSPITAL_COMMUNITY): Payer: Self-pay

## 2022-11-27 ENCOUNTER — Other Ambulatory Visit: Payer: Self-pay

## 2022-11-27 DIAGNOSIS — C49A3 Gastrointestinal stromal tumor of small intestine: Secondary | ICD-10-CM

## 2022-11-28 ENCOUNTER — Ambulatory Visit (HOSPITAL_COMMUNITY): Payer: 59

## 2022-11-28 ENCOUNTER — Inpatient Hospital Stay: Payer: 59

## 2022-11-28 ENCOUNTER — Telehealth: Payer: Self-pay | Admitting: Hematology

## 2022-12-02 ENCOUNTER — Inpatient Hospital Stay: Payer: 59 | Admitting: Hematology

## 2022-12-26 ENCOUNTER — Telehealth: Payer: Self-pay | Admitting: Hematology

## 2022-12-26 ENCOUNTER — Inpatient Hospital Stay: Payer: 59 | Attending: Hematology

## 2022-12-26 ENCOUNTER — Ambulatory Visit (HOSPITAL_COMMUNITY)
Admission: RE | Admit: 2022-12-26 | Discharge: 2022-12-26 | Disposition: A | Discharge status: Home / Self Care | Payer: 59 | Source: Ambulatory Visit | Attending: Hematology

## 2022-12-26 DIAGNOSIS — R918 Other nonspecific abnormal finding of lung field: Secondary | ICD-10-CM | POA: Insufficient documentation

## 2022-12-26 DIAGNOSIS — Z8509 Personal history of malignant neoplasm of other digestive organs: Secondary | ICD-10-CM | POA: Insufficient documentation

## 2022-12-26 DIAGNOSIS — C49A3 Gastrointestinal stromal tumor of small intestine: Secondary | ICD-10-CM | POA: Insufficient documentation

## 2022-12-26 LAB — CMP (CANCER CENTER ONLY)
ALT: 25 U/L (ref 0–44)
AST: 22 U/L (ref 15–41)
Albumin: 4.8 g/dL (ref 3.5–5.0)
Alkaline Phosphatase: 80 U/L (ref 38–126)
Anion gap: 8 (ref 5–15)
BUN: 28 mg/dL — ABNORMAL HIGH (ref 6–20)
CO2: 26 mmol/L (ref 22–32)
Calcium: 10.8 mg/dL — ABNORMAL HIGH (ref 8.9–10.3)
Chloride: 104 mmol/L (ref 98–111)
Creatinine: 1.01 mg/dL — ABNORMAL HIGH (ref 0.44–1.00)
GFR, Estimated: >60 mL/min
Glucose, Bld: 93 mg/dL (ref 70–99)
Potassium: 4.4 mmol/L (ref 3.5–5.1)
Sodium: 138 mmol/L (ref 135–145)
Total Bilirubin: 0.7 mg/dL (ref 0.3–1.2)
Total Protein: 7.7 g/dL (ref 6.5–8.1)

## 2022-12-26 LAB — CBC WITH DIFFERENTIAL (CANCER CENTER ONLY)
Abs Immature Granulocytes: 0.04 10*3/uL (ref 0.00–0.07)
Basophils Absolute: 0.1 10*3/uL (ref 0.0–0.1)
Basophils Relative: 1 %
Eosinophils Absolute: 0.1 10*3/uL (ref 0.0–0.5)
Eosinophils Relative: 1 %
HCT: 41.4 % (ref 36.0–46.0)
Hemoglobin: 14 g/dL (ref 12.0–15.0)
Immature Granulocytes: 0 %
Lymphocytes Relative: 37 %
Lymphs Abs: 3.6 10*3/uL (ref 0.7–4.0)
MCH: 29.6 pg (ref 26.0–34.0)
MCHC: 33.8 g/dL (ref 30.0–36.0)
MCV: 87.5 fL (ref 80.0–100.0)
Monocytes Absolute: 0.6 10*3/uL (ref 0.1–1.0)
Monocytes Relative: 7 %
Neutro Abs: 5.2 10*3/uL (ref 1.7–7.7)
Neutrophils Relative %: 54 %
Platelet Count: 317 10*3/uL (ref 150–400)
RBC: 4.73 MIL/uL (ref 3.87–5.11)
RDW: 12.9 % (ref 11.5–15.5)
WBC Count: 9.7 10*3/uL (ref 4.0–10.5)
nRBC: 0 % (ref 0.0–0.2)

## 2022-12-31 ENCOUNTER — Other Ambulatory Visit: Payer: Self-pay

## 2022-12-31 ENCOUNTER — Inpatient Hospital Stay: Payer: 59 | Admitting: Hematology

## 2022-12-31 VITALS — BP 122/64 | HR 76 | Temp 97.5°F | Resp 18 | Ht 62.5 in | Wt 175.7 lb

## 2022-12-31 DIAGNOSIS — C49A3 Gastrointestinal stromal tumor of small intestine: Secondary | ICD-10-CM | POA: Diagnosis not present

## 2022-12-31 NOTE — Assessment & Plan Note (Signed)
-  low grade, pT4NXM0, stage IIIA vs IIIB, 10cm mass with perforation, cKIT mutation (+)  -diagnosed 2016, s/p resection 06/07/14 showing 10.2 cm GIST. Completed 3 years adjuvant Gleevec in 06/2017. -she was lost to f/u 03/2017 - 10/2021. -she returned 11/21/21, CBC and CMP from that day were WNL. -CT CAP obtained 12/05/21 showed NED.  Multiple small groundglass change in her lungs are likely related to her COVID in late 2022

## 2022-12-31 NOTE — Progress Notes (Signed)
[facility]   Telephone:(336) [phone] Fax:(336) [phone]   Clinic Follow up Note   Patient Care Team: Sigmund Hazel, MD as PCP - General (Family Medicine) Henreitta Leber, PA-C as Physician Assistant (Obstetrics and Gynecology) Glenna Fellows, MD (Inactive) as Consulting Physician (General Surgery) Malachy Mood, MD as Consulting Physician (Medical Oncology) Wandalee Ferdinand, RN as Registered Nurse (Medical Oncology) Henreitta Leber, PA-C as Physician Assistant (Obstetrics and Gynecology)  Date of Service:  12/31/2022  CHIEF COMPLAINT: f/u of GIST   CURRENT THERAPY:  Cancer surveillance  Oncology History   GIST (gastrointestinal stromal tumor) of jejunum with perforation, s/p SB resection 06/07/2014 -low grade, pT4NXM0, stage IIIA vs IIIB, 10cm mass with perforation, cKIT mutation (+)  -diagnosed 2016, s/p resection 06/07/14 showing 10.2 cm GIST. Completed 3 years adjuvant Gleevec in 06/2017. -she was lost to f/u 03/2017 - 10/2021. -she returned 11/21/21, CBC and CMP from that day were WNL. -CT CAP obtained 12/05/21 showed NED.  Multiple small groundglass change in her lungs are likely related to her COVID in late 2022   Assessment and Plan    GIST of duodenum -On surveillance, it has been 8 years since her diagnosis, her risk of recurrence is very low now -No routine surveillance CT scans, will follow-up clinically for 2 more years -She is clinically doing well, no concern for recurrence   Lung Nodules I personally reviewed her CT scan from December 26, 2022.  New small nodules in the right lower lobe, likely due to infection or inflammation. Stable nodules in the right upper lobe and left upper lobe. No evidence of malignancy. -Due to her recurrent small lung nodules, and a strong family history of lung cancer, will refer to pulmonologist for further evaluation and management. -Consider repeat CT chest in 6 months, pending pulmonologist's recommendation.   Elevated  Calcium Slightly elevated, first occurrence. -Repeat labs in 2 months with primary care physician. -If consistently high, consider checking parathyroid gland function.  Cancer Screening Family history of various cancers, but genetic testing was negative. -Continue annual mammograms and pap smears. -Plan for colonoscopy due to family history of colon cancer.  Follow-up -See pulmonologist as per referral. -Return to oncology in 1 year, or sooner if any concerns arise. -After 2 more years, transition to primary care for follow-up.         SUMMARY OF ONCOLOGIC HISTORY: Oncology History Overview Note  GIST (gastrointestinal stromal tumor) of jejunum with perforation, s/p SB resection 06/07/2014   Staging form: Small Intestine, AJCC 7th Edition     Pathologic stage from 07/03/2014: Stage Unknown (T4, NX, cM0) - Unsigned     GIST (gastrointestinal stromal tumor) of jejunum with perforation, s/p SB resection 06/07/2014  06/07/2014 Initial Diagnosis   GIST (gastrointestinal stromal tumor) of jejunum with perforation, s/p SB resection 06/07/2014   06/07/2014 Imaging   9.3 X 8.4 X 7.5 cm mass left lower quadrant. Recruitment of vasculature about the mass, mild nodularity overlying the adjacent descending colon.   06/07/2014 Pathologic Stage   pT4,pNX / GI:low grade; mitotic rate 5/50   06/07/2014 Surgery   Exploratory laparotomy resection of abdominal mass and portion of small bowel   06/07/2014 Miscellaneous   tumor was tested for c-kit gene mutation, (+) p.M010 deletion/insertion NP. This predicts good response to Tyrosine kinase inhibitor.   07/18/2014 - 06/2017 Chemotherapy   Gleevec 400 mg po   10/16/2016 Imaging   CT CAP W Contrast 10/16/16 IMPRESSION: No evidence of recurrent neoplasm or metastatic disease within  the chest, abdomen, or pelvis.  Stable sub-cm right upper lobe pulmonary nodules, consistent with benign etiology.  Cholelithiasis.  No radiographic evidence of  cholecystitis   10/16/2016 Imaging   10/16/2016 CT CAP IMPRESSION: No evidence of recurrent neoplasm or metastatic disease within the chest, abdomen, or pelvis.  Stable sub-cm right upper lobe pulmonary nodules, consistent with benign etiology.  Cholelithiasis.  No radiographic evidence of cholecystitis.   03/23/2018 Imaging   03/23/2018 CT AP IMPRESSION: 1. No evidence of local recurrence or metastatic disease. 2. No acute abdominopelvic findings. 3. Slight enlargement of left perivaginal cyst, likely a Bartholin cyst.   05/29/2022 Imaging    IMPRESSION: 1. Resolution of the 3-4 mm nodule seen in the RIGHT lower lobe on the prior exam. 2. Subtle perifissural nodularity in the RIGHT chest is stable and potentially represents sequela of prior granulomatous disease. 3. LEFT upper lobe parenchymal distortion with area of nodularity at the terminus of bandlike changes stable since at least September of 2023 in very similar to imaging from 2017. Consider continued attention on follow-up at 1 year from the September 2023 evaluation. 4. No new pulmonary nodules.        Discussed the use of AI scribe software for clinical note transcription with the patient, who gave verbal consent to proceed.  History of Present Illness   Miranda Howe, a 52 year old female with a history of a tumor, presents for a follow-up visit. She reports occasional feelings of something in her chest and has been experiencing a dry mouth for the past two months. She denies having a runny nose or a constant need to clear her throat. She has a family history of various cancers, including lung, breast, pancreatic, and prostate cancer. Her brother was recently diagnosed with stage three lung cancer, and her mother passed away from breast cancer that metastasized to the lungs. She also reports having acid reflux.         All other systems were reviewed with the patient and are negative.  MEDICAL HISTORY:  Past Medical  History:  Diagnosis Date   Anxiety    Family history of colon cancer    gist dx'd 06/07/14    SURGICAL HISTORY: Past Surgical History:  Procedure Laterality Date   BARTHOLIN GLAND CYST EXCISION  01/17/2009   I&D   CERVICAL POLYPECTOMY  2010   LAPAROTOMY N/A 06/07/2014   Procedure: EXPLORATORY LAPAROTOMY RESECTION OF ABDOMINAL MASS AND PORTION OF SMALL BOWEL;  Surgeon: Glenna Fellows, MD;  Location: [facility];  Service: General;  Laterality: N/A;   WISDOM TOOTH EXTRACTION      I have reviewed the social history and family history with the patient and they are unchanged from previous note.  ALLERGIES:  is allergic to sulfa antibiotics.  MEDICATIONS:  Current Outpatient Medications  Medication Sig Dispense Refill   acetaminophen (TYLENOL) 325 MG tablet Take 2 tablets (650 mg total) by mouth every 6 (six) hours as needed for mild pain, moderate pain, fever or headache.     citalopram (CELEXA) 10 MG tablet Take 10 mg by mouth daily.     ibuprofen (ADVIL,MOTRIN) 200 MG tablet Take 400 mg by mouth every 6 (six) hours as needed for moderate pain.     Multiple Vitamin (MULTIVITAMIN) tablet Take 1 tablet by mouth daily.     No current facility-administered medications for this visit.    PHYSICAL EXAMINATION: ECOG PERFORMANCE STATUS: 0 - Asymptomatic  Vitals:   12/31/22 1512  BP: 122/64  Pulse: 76  Resp: 18  Temp: (!) 97.5 F (36.4 C)  SpO2: 98%   Wt Readings from Last 3 Encounters:  12/31/22 175 lb 11.2 oz (79.7 kg)  11/21/21 179 lb 4.8 oz (81.3 kg)  03/30/17 179 lb 12.8 oz (81.6 kg)     GENERAL:alert, no distress and comfortable SKIN: skin color, texture, turgor are normal, no rashes or significant lesions EYES: normal, Conjunctiva are pink and non-injected, sclera clear NECK: supple, thyroid normal size, non-tender, without nodularity LYMPH:  no palpable lymphadenopathy in the cervical, axillary  LUNGS: clear to auscultation and percussion with normal breathing  effort HEART: regular rate & rhythm and no murmurs and no lower extremity edema ABDOMEN:abdomen soft, non-tender and normal bowel sounds Musculoskeletal:no cyanosis of digits and no clubbing  NEURO: alert & oriented x 3 with fluent speech, no focal motor/sensory deficits   LABORATORY DATA:  I have reviewed the data as listed    Latest Ref Rng & Units 12/26/2022    3:41 PM 01/20/2022    2:13 PM 11/21/2021    2:44 PM  CBC  WBC 4.0 - 10.5 K/uL 9.7  10.3  8.8   Hemoglobin 12.0 - 15.0 g/dL 96.0  45.4  09.8   Hematocrit 36.0 - 46.0 % 41.4  40.1  38.5   Platelets 150 - 400 K/uL 317  279  291         Latest Ref Rng & Units 12/26/2022    3:41 PM 01/20/2022    2:13 PM 11/21/2021    2:44 PM  CMP  Glucose 70 - 99 mg/dL 93  119  147   BUN 6 - 20 mg/dL 28  25  26    Creatinine 0.44 - 1.00 mg/dL 8.29  5.62  1.30   Sodium 135 - 145 mmol/L 138  138  138   Potassium 3.5 - 5.1 mmol/L 4.4  4.3  3.8   Chloride 98 - 111 mmol/L 104  104  106   CO2 22 - 32 mmol/L 26  28  26    Calcium 8.9 - 10.3 mg/dL 86.5  78.4  9.8   Total Protein 6.5 - 8.1 g/dL 7.7  7.2  7.2   Total Bilirubin 0.3 - 1.2 mg/dL 0.7  0.4  0.5   Alkaline Phos 38 - 126 U/L 80  75  60   AST 15 - 41 U/L 22  20  20    ALT 0 - 44 U/L 25  23  21        RADIOGRAPHIC STUDIES: I have personally reviewed the radiological images as listed and agreed with the findings in the report. No results found.    Orders Placed This Encounter  Procedures   Ambulatory referral to Pulmonology    Referral Priority:   Routine    Referral Type:   Consultation    Referral Reason:   Specialty Services Required    Requested Specialty:   Pulmonary Disease    Number of Visits Requested:   1   All questions were answered. The patient knows to call the clinic with any problems, questions or concerns. No barriers to learning was detected. The total time spent in the appointment was 20 minutes.     Malachy Mood, MD 12/31/2022

## 2023-03-19 ENCOUNTER — Institutional Professional Consult (permissible substitution): Payer: 59 | Admitting: Pulmonary Disease

## 2023-03-19 ENCOUNTER — Encounter: Payer: Self-pay | Admitting: Pulmonary Disease

## 2023-03-19 NOTE — Progress Notes (Deleted)
Synopsis: Referred in Jan 04, 2025for lung nodules by Malachy Mood, MD  Subjective:   PATIENT ID: Miranda Howe GENDER: female DOB: Jan 23, 1971, MRN: [id number]   HPI  No chief complaint on file.  Miranda Howe is a 52 year old woman, never smoker with history of GIST who is referred to pulmonary clinic for lung nodules.     Past Medical History:  Diagnosis Date   Anxiety    Family history of colon cancer    gist dx'd 06/07/14     Family History  Problem Relation Age of Onset   Heart disease Father    Hypertension Father    Cancer Father 53       colon cancer; currently 49   Other Mother        varicose veins   Cancer Mother 1       breast cancer; currently 67   Hypertension Mother    Diabetes Mother    Lupus Cousin    Cancer Paternal Grandfather        colon cancer (age?); deceased   Cancer Maternal Aunt        uterine cancer 27-Mar-2066; deceased 7s   Cancer Paternal Uncle        pancreatic; deceased 39s   Cancer Paternal Uncle        colon cancer 44s; currently 21   Cancer Paternal Uncle        colon cancer 55s; currently 13s     Social History   Socioeconomic History   Marital status: Married    Spouse name: Not on file   Number of children: Not on file   Years of education: Not on file   Highest education level: Not on file  Occupational History   Not on file  Tobacco Use   Smoking status: Never   Smokeless tobacco: Never  Substance and Sexual Activity   Alcohol use: Yes    Comment: socially   Drug use: No   Sexual activity: Yes    Birth control/protection: Condom, Other-see comments    Comment: pt use gel   Other Topics Concern   Not on file  Social History Narrative   Married, husband Publishing copy   Employed as elementary school principal   # 2 children--9 yo and 13yo   Social Drivers of Corporate investment banker Strain: Not on file  Food Insecurity: Not on file  Transportation Needs: Not on file  Physical Activity: Not on file  Stress:  Not on file  Social Connections: Not on file  Intimate Partner Violence: Not on file     Allergies  Allergen Reactions   Sulfa Antibiotics Hives     Outpatient Medications Prior to Visit  Medication Sig Dispense Refill   acetaminophen (TYLENOL) 325 MG tablet Take 2 tablets (650 mg total) by mouth every 6 (six) hours as needed for mild pain, moderate pain, fever or headache.     citalopram (CELEXA) 10 MG tablet Take 10 mg by mouth daily.     ibuprofen (ADVIL,MOTRIN) 200 MG tablet Take 400 mg by mouth every 6 (six) hours as needed for moderate pain.     Multiple Vitamin (MULTIVITAMIN) tablet Take 1 tablet by mouth daily.     No facility-administered medications prior to visit.    ROS    Objective:  There were no vitals filed for this visit.   Physical Exam    CBC    Component Value Date/Time   WBC 9.7 12/26/2022 1541  WBC 10.3 01/20/2022 1413   RBC 4.73 12/26/2022 1541   HGB 14.0 12/26/2022 1541   HGB 12.5 06/06/2016 1341   HCT 41.4 12/26/2022 1541   HCT 37.1 06/06/2016 1341   PLT 317 12/26/2022 1541   PLT 239 06/06/2016 1341   MCV 87.5 12/26/2022 1541   MCV 93.0 06/06/2016 1341   MCH 29.6 12/26/2022 1541   MCHC 33.8 12/26/2022 1541   RDW 12.9 12/26/2022 1541   RDW 13.5 06/06/2016 1341   LYMPHSABS 3.6 12/26/2022 1541   LYMPHSABS 3.2 06/06/2016 1341   MONOABS 0.6 12/26/2022 1541   MONOABS 0.4 06/06/2016 1341   EOSABS 0.1 12/26/2022 1541   EOSABS 0.2 06/06/2016 1341   BASOSABS 0.1 12/26/2022 1541   BASOSABS 0.0 06/06/2016 1341     Chest imaging:  PFT:     No data to display          Labs:  Path:  Echo:  Heart Catheterization:       Assessment & Plan:   No diagnosis found.  Discussion: ***    Current Outpatient Medications:    acetaminophen (TYLENOL) 325 MG tablet, Take 2 tablets (650 mg total) by mouth every 6 (six) hours as needed for mild pain, moderate pain, fever or headache., Disp: , Rfl:    citalopram (CELEXA) 10 MG  tablet, Take 10 mg by mouth daily., Disp: , Rfl:    ibuprofen (ADVIL,MOTRIN) 200 MG tablet, Take 400 mg by mouth every 6 (six) hours as needed for moderate pain., Disp: , Rfl:    Multiple Vitamin (MULTIVITAMIN) tablet, Take 1 tablet by mouth daily., Disp: , Rfl:

## 2023-08-28 ENCOUNTER — Other Ambulatory Visit: Payer: Self-pay

## 2023-08-28 ENCOUNTER — Telehealth: Payer: Self-pay | Admitting: Hematology

## 2023-08-28 ENCOUNTER — Telehealth: Payer: Self-pay

## 2023-08-28 NOTE — Telephone Encounter (Signed)
 Pt called stating she has a purplish area under her left rib cage that is painful.  Pt stated she is a GIST pt that had surgery 9+ yr ago.  Pt denied the area being hot to touch and denied fevers.  Pt stated she's having normal Bms.  Pt stated she's concern since she has a hx of GIST.  Pt would like to be seen if possible.  Stated this nurse will have Dr. Candise Chambers scheduler contact the pt to get her scheduled to see one of Dr. Candise Chambers APPs.  Pt verbalized understanding and had no further questions or concerns at this time.

## 2023-08-30 ENCOUNTER — Other Ambulatory Visit: Payer: Self-pay | Admitting: Nurse Practitioner

## 2023-08-30 DIAGNOSIS — C49A3 Gastrointestinal stromal tumor of small intestine: Secondary | ICD-10-CM

## 2023-08-30 NOTE — Progress Notes (Unsigned)
 Patient Care Team: Perley Bradley, MD as PCP - General (Family Medicine) Ivin Marrow, PA-C as Physician Assistant (Obstetrics and Gynecology) Ayesha Lente, MD (Inactive) as Consulting Physician (General Surgery) Sonja Davie, MD as Consulting Physician (Medical Oncology) Elsa Halls, RN as Registered Nurse (Medical Oncology) Ivin Marrow, PA-C as Physician Assistant (Obstetrics and Gynecology)  Clinic Day:  08/31/2023  Referring physician: Perley Bradley, MD  ASSESSMENT & PLAN:   Assessment & Plan: GIST (gastrointestinal stromal tumor) of jejunum with perforation, s/p SB resection 06/07/2014 -low grade, pT4NXM0, stage IIIA vs IIIB, 10cm mass with perforation, cKIT mutation (+)  -diagnosed 2016, s/p resection 06/07/14 showing 10.2 cm GIST. Completed 3 years adjuvant Gleevec  in 06/2017. -she was lost to f/u 03/2017 - 10/2021. -she returned 11/21/21, CBC and CMP from that day were WNL. -CT CAP obtained 12/05/21 showed NED.  Multiple small groundglass change in her lungs are likely related to her COVID in late 2022 -CT Chest from 12/26/2022 - stable lung nodules which appear benign.  08/31/2023 - new CT CAP due to new abdominal pain, skin discoloration, and palpable lump under surface of the skin. Will notify patient of results when they are available and plan for further care. Recommend GI provider if scan is negative.    Abdominal discomfort  The patient has had abdominal discomfort in mid-epigastric area of the abdomen for about a month. She has noted some discoloration of the skin and thinks she can feel a mass under the skin. She reports some nausea, but nothing like she had when she was initially diagnosed with GIST tumor. She denies changes in bowel habits. Denies fevers or chills. Appetite is good and she has not had unintentional weight loss. Her most recent CT CAP was done 11/2021 and showed no evidence of recurrence or metastatic disease. Small lung nodules have been monitored yearly via  CT chest. Stable, benign appearing lung nodules were noted on her most recent CT Chest from 12/26/2022.   Plan:  Reviewed labs. -stable and unremarkable.  New CT CAP ordered for further evaluation. Scheduled for later today  Follow up with patient when results are available and decide future treatment plan.  Discussed potential need for GI provider if CT scan negative. She is agreeable.    The patient understands the plans discussed today and is in agreement with them.  She knows to contact our office if she develops concerns prior to her next appointment.  I provided 25 minutes of face-to-face time during this encounter and > 50% was spent counseling as documented under my assessment and plan.    Sharyon Deis, NP  [facility] - A DEPT OF [facility] [address] [place] [address] Dept: [phone] Dept Fax: [phone]   Orders Placed This Encounter  Procedures   CT CHEST ABDOMEN PELVIS W CONTRAST    Standing Status:   Future    Expected Date:   09/07/2023    Expiration Date:   08/30/2024    If indicated for the ordered procedure, I authorize the administration of contrast media per Radiology protocol:   Yes    Does the patient have a contrast media/X-ray dye allergy?:   No    Preferred imaging location?:   [facility]    If indicated for the ordered procedure, I authorize the administration of oral contrast media per Radiology protocol:   No    Reason for no oral contrast::  history of GIST tumor with perforation. new abdomel pain with palpable mass in epigastric area of the abdomen.      CHIEF COMPLAINT:  CC: GIST tumor of jejunum with perforation  Current Treatment: Cancer surveillance.  INTERVAL HISTORY:  Chiyeko is here today for repeat clinical assessment.  She she was last seen by Dr. Maryalice Smaller on 12/31/2022.  She she recently called stating that she had a purplish area under her left rib  cage that is painful.  It has been 9+ years since initial diagnosis with GIST tumor. The patient is reporting abdominal discomfort in mid-epigastric area of the abdomen for about a month. She has noted some discoloration of the skin and thinks she can feel a mass under the skin. She reports some nausea, but nothing like she had when she was initially diagnosed with GIST tumor. She denies changes in bowel habits. Denies fevers or chills. Appetite is good and she has not had unintentional weight loss.  She is concerned about new recurrence or metastatic disease.  She denies fevers or chills. Her appetite is good. Her weight has increased 18 pounds over last 6 months .  I have reviewed the past medical history, past surgical history, social history and family history with the patient and they are unchanged from previous note.  ALLERGIES:  is allergic to sulfa antibiotics.  MEDICATIONS:  Current Outpatient Medications  Medication Sig Dispense Refill   acetaminophen  (TYLENOL ) 325 MG tablet Take 2 tablets (650 mg total) by mouth every 6 (six) hours as needed for mild pain, moderate pain, fever or headache.     citalopram  (CELEXA ) 10 MG tablet Take 10 mg by mouth daily.     ibuprofen  (ADVIL ,MOTRIN ) 200 MG tablet Take 400 mg by mouth every 6 (six) hours as needed for moderate pain.     Multiple Vitamin (MULTIVITAMIN) tablet Take 1 tablet by mouth daily.     No current facility-administered medications for this visit.    HISTORY OF PRESENT ILLNESS:   Oncology History Overview Note  GIST (gastrointestinal stromal tumor) of jejunum with perforation, s/p SB resection 06/07/2014   Staging form: Small Intestine, AJCC 7th Edition     Pathologic stage from 07/03/2014: Stage Unknown (T4, NX, cM0) - Unsigned     GIST (gastrointestinal stromal tumor) of jejunum with perforation, s/p SB resection 06/07/2014  06/07/2014 Initial Diagnosis   GIST (gastrointestinal stromal tumor) of jejunum with perforation, s/p SB  resection 06/07/2014   06/07/2014 Imaging   9.3 X 8.4 X 7.5 cm mass left lower quadrant. Recruitment of vasculature about the mass, mild nodularity overlying the adjacent descending colon.   06/07/2014 Pathologic Stage   pT4,pNX / GI:low grade; mitotic rate 5/50   06/07/2014 Surgery   Exploratory laparotomy resection of abdominal mass and portion of small bowel   06/07/2014 Miscellaneous   tumor was tested for c-kit gene mutation, (+) p.W295 deletion/insertion NP. This predicts good response to Tyrosine kinase inhibitor.   07/18/2014 - 06/2017 Chemotherapy   Gleevec  400 mg po   10/16/2016 Imaging   CT CAP W Contrast 10/16/16 IMPRESSION: No evidence of recurrent neoplasm or metastatic disease within the chest, abdomen, or pelvis.  Stable sub-cm right upper lobe pulmonary nodules, consistent with benign etiology.  Cholelithiasis.  No radiographic evidence of cholecystitis   10/16/2016 Imaging   10/16/2016 CT CAP IMPRESSION: No evidence of recurrent neoplasm or metastatic disease within the chest, abdomen, or pelvis.  Stable sub-cm right upper lobe pulmonary nodules, consistent with benign etiology.  Cholelithiasis.  No radiographic evidence of cholecystitis.   03/23/2018 Imaging   03/23/2018 CT AP IMPRESSION: 1. No evidence of local recurrence or metastatic disease. 2. No acute abdominopelvic findings. 3. Slight enlargement of left perivaginal cyst, likely a Bartholin cyst.   05/29/2022 Imaging    IMPRESSION: 1. Resolution of the 3-4 mm nodule seen in the RIGHT lower lobe on the prior exam. 2. Subtle perifissural nodularity in the RIGHT chest is stable and potentially represents sequela of prior granulomatous disease. 3. LEFT upper lobe parenchymal distortion with area of nodularity at the terminus of bandlike changes stable since at least September of 2023 in very similar to imaging from 2017. Consider continued attention on follow-up at 1 year from the September 2023  evaluation. 4. No new pulmonary nodules.         REVIEW OF SYSTEMS:   Constitutional: Denies fevers, chills or abnormal weight loss Eyes: Denies blurriness of vision Ears, nose, mouth, throat, and face: Denies mucositis or sore throat Respiratory: Denies cough, dyspnea or wheezes Cardiovascular: Denies palpitation, chest discomfort or lower extremity swelling Gastrointestinal:  Denies nausea, heartburn or change in bowel habits. Has abdominal tenderness with skin discoloration. Feels lump under surface of the skin.  Skin: Denies abnormal skin rashes Lymphatics: Denies new lymphadenopathy or easy bruising Neurological:Denies numbness, tingling or new weaknesses Behavioral/Psych: Mood is stable, no new changes  All other systems were reviewed with the patient and are negative.   VITALS:   Today's Vitals   08/31/23 1010  BP: 120/64  Pulse: 64  Resp: 17  Temp: 97.8 F (36.6 C)  SpO2: 98%  Weight: 193 lb 4.8 oz (87.7 kg)   Body mass index is 34.79 kg/m.   Wt Readings from Last 3 Encounters:  08/31/23 193 lb 4.8 oz (87.7 kg)  12/31/22 175 lb 11.2 oz (79.7 kg)  11/21/21 179 lb 4.8 oz (81.3 kg)    Body mass index is 34.79 kg/m.  Performance status (ECOG): 1 - Symptomatic but completely ambulatory  PHYSICAL EXAM:   GENERAL:alert, no distress and comfortable SKIN: skin color, texture, turgor are normal, no rashes or significant lesions EYES: normal, Conjunctiva are pink and non-injected, sclera clear OROPHARYNX:no exudate, no erythema and lips, buccal mucosa, and tongue normal  NECK: supple, thyroid  normal size, non-tender, without nodularity LYMPH:  no palpable lymphadenopathy in the cervical, axillary or inguinal LUNGS: clear to auscultation and percussion with normal breathing effort HEART: regular rate & rhythm and no murmurs and no lower extremity edema ABDOMEN:abdomen soft with normal bowel sounds. There is slight reddening of the skin of epigastric area. This is  tender to palpate. There is some fibrous tissue which can be palpated. This correlates to the previus surgical scar.  Musculoskeletal:no cyanosis of digits and no clubbing  NEURO: alert & oriented x 3 with fluent speech, no focal motor/sensory deficits  LABORATORY DATA:  I have reviewed the data as listed    Component Value Date/Time   NA 138 08/31/2023 0945   NA 140 06/06/2016 1341   K 4.6 08/31/2023 0945   K 4.4 06/06/2016 1341   CL 106 08/31/2023 0945   CO2 26 08/31/2023 0945   CO2 26 06/06/2016 1341   GLUCOSE 102 (H) 08/31/2023 0945   GLUCOSE 115 06/06/2016 1341   BUN 21 (H) 08/31/2023 0945   BUN 13.8 06/06/2016 1341   CREATININE 0.87 08/31/2023 0945   CREATININE 1.1 06/06/2016 1341   CALCIUM 10.3 08/31/2023 0945   CALCIUM 9.7 06/06/2016 1341   PROT 7.1 08/31/2023  0945   PROT 7.0 06/06/2016 1341   ALBUMIN  4.5 08/31/2023 0945   ALBUMIN  4.1 06/06/2016 1341   AST 26 08/31/2023 0945   AST 22 06/06/2016 1341   ALT 29 08/31/2023 0945   ALT 24 06/06/2016 1341   ALKPHOS 72 08/31/2023 0945   ALKPHOS 73 06/06/2016 1341   BILITOT 0.5 08/31/2023 0945   BILITOT 0.57 06/06/2016 1341   GFRNONAA >60 08/31/2023 0945   GFRAA >60 03/22/2018 1457    Lab Results  Component Value Date   WBC 8.5 08/31/2023   NEUTROABS 4.0 08/31/2023   HGB 14.1 08/31/2023   HCT 40.4 08/31/2023   MCV 83.5 08/31/2023   PLT 271 08/31/2023

## 2023-08-30 NOTE — Assessment & Plan Note (Signed)
-  low grade, pT4NXM0, stage IIIA vs IIIB, 10cm mass with perforation, cKIT mutation (+)  -diagnosed 2016, s/p resection 06/07/14 showing 10.2 cm GIST. Completed 3 years adjuvant Gleevec  in 06/2017. -she was lost to f/u 03/2017 - 10/2021. -she returned 11/21/21, CBC and CMP from that day were WNL. -CT CAP obtained 12/05/21 showed NED.  Multiple small groundglass change in her lungs are likely related to her COVID in late 2022 -CT Chest from 12/26/2022 - stable lung nodules which appear benign.  08/31/2023 - new CT CAP due to new abdominal pain, skin discoloration, and palpable lump under surface of the skin. Will notify patient of results when they are available and plan for further care. Recommend GI provider if scan is negative.

## 2023-08-31 ENCOUNTER — Ambulatory Visit (HOSPITAL_BASED_OUTPATIENT_CLINIC_OR_DEPARTMENT_OTHER)
Admission: RE | Admit: 2023-08-31 | Discharge: 2023-08-31 | Disposition: A | Discharge status: Home / Self Care | Source: Ambulatory Visit | Attending: Nurse Practitioner | Admitting: Nurse Practitioner

## 2023-08-31 ENCOUNTER — Encounter: Payer: Self-pay | Admitting: Nurse Practitioner

## 2023-08-31 ENCOUNTER — Inpatient Hospital Stay (HOSPITAL_BASED_OUTPATIENT_CLINIC_OR_DEPARTMENT_OTHER): Admitting: Nurse Practitioner

## 2023-08-31 ENCOUNTER — Inpatient Hospital Stay: Attending: Hematology

## 2023-08-31 VITALS — BP 120/64 | HR 64 | Temp 97.8°F | Resp 17 | Wt 193.3 lb

## 2023-08-31 DIAGNOSIS — C49A3 Gastrointestinal stromal tumor of small intestine: Secondary | ICD-10-CM | POA: Insufficient documentation

## 2023-08-31 LAB — CBC WITH DIFFERENTIAL (CANCER CENTER ONLY)
Abs Immature Granulocytes: 0.03 10*3/uL (ref 0.00–0.07)
Basophils Absolute: 0.1 10*3/uL (ref 0.0–0.1)
Basophils Relative: 1 %
Eosinophils Absolute: 0.3 10*3/uL (ref 0.0–0.5)
Eosinophils Relative: 3 %
HCT: 40.4 % (ref 36.0–46.0)
Hemoglobin: 14.1 g/dL (ref 12.0–15.0)
Immature Granulocytes: 0 %
Lymphocytes Relative: 40 %
Lymphs Abs: 3.4 10*3/uL (ref 0.7–4.0)
MCH: 29.1 pg (ref 26.0–34.0)
MCHC: 34.9 g/dL (ref 30.0–36.0)
MCV: 83.5 fL (ref 80.0–100.0)
Monocytes Absolute: 0.7 10*3/uL (ref 0.1–1.0)
Monocytes Relative: 8 %
Neutro Abs: 4 10*3/uL (ref 1.7–7.7)
Neutrophils Relative %: 48 %
Platelet Count: 271 10*3/uL (ref 150–400)
RBC: 4.84 MIL/uL (ref 3.87–5.11)
RDW: 13 % (ref 11.5–15.5)
WBC Count: 8.5 10*3/uL (ref 4.0–10.5)
nRBC: 0 % (ref 0.0–0.2)

## 2023-08-31 LAB — CMP (CANCER CENTER ONLY)
ALT: 29 U/L (ref 0–44)
AST: 26 U/L (ref 15–41)
Albumin: 4.5 g/dL (ref 3.5–5.0)
Alkaline Phosphatase: 72 U/L (ref 38–126)
Anion gap: 6 (ref 5–15)
BUN: 21 mg/dL — ABNORMAL HIGH (ref 6–20)
CO2: 26 mmol/L (ref 22–32)
Calcium: 10.3 mg/dL (ref 8.9–10.3)
Chloride: 106 mmol/L (ref 98–111)
Creatinine: 0.87 mg/dL (ref 0.44–1.00)
GFR, Estimated: >60 mL/min (ref >60)
Glucose, Bld: 102 mg/dL — ABNORMAL HIGH (ref 70–99)
Potassium: 4.6 mmol/L (ref 3.5–5.1)
Sodium: 138 mmol/L (ref 135–145)
Total Bilirubin: 0.5 mg/dL (ref 0.0–1.2)
Total Protein: 7.1 g/dL (ref 6.5–8.1)

## 2023-08-31 MED ORDER — IOHEXOL 300 MG/ML  SOLN
100.0000 mL | Freq: Once | INTRAMUSCULAR | Status: AC | PRN
  Administered 2023-08-31: 100 mL via INTRAVENOUS

## 2023-09-01 ENCOUNTER — Ambulatory Visit: Payer: Self-pay | Admitting: Nurse Practitioner

## 2023-09-01 ENCOUNTER — Other Ambulatory Visit: Payer: Self-pay | Admitting: Nurse Practitioner

## 2023-09-01 DIAGNOSIS — C49A3 Gastrointestinal stromal tumor of small intestine: Secondary | ICD-10-CM

## 2023-09-01 MED ORDER — PANTOPRAZOLE SODIUM 40 MG PO TBEC: 40.0000 mg | DELAYED_RELEASE_TABLET | Freq: Every day | ORAL | 1 refills | Status: DC

## 2023-10-15 ENCOUNTER — Encounter: Payer: Self-pay | Admitting: Gastroenterology

## 2023-10-25 ENCOUNTER — Other Ambulatory Visit: Payer: Self-pay | Admitting: Nurse Practitioner

## 2023-12-09 ENCOUNTER — Encounter: Payer: Self-pay | Admitting: Gastroenterology

## 2023-12-09 ENCOUNTER — Ambulatory Visit: Admitting: Gastroenterology

## 2023-12-09 VITALS — BP 90/56 | HR 68 | Ht 63.0 in | Wt 191.2 lb

## 2023-12-09 DIAGNOSIS — Z8509 Personal history of malignant neoplasm of other digestive organs: Secondary | ICD-10-CM

## 2023-12-09 DIAGNOSIS — Z1211 Encounter for screening for malignant neoplasm of colon: Secondary | ICD-10-CM | POA: Diagnosis not present

## 2023-12-09 DIAGNOSIS — K219 Gastro-esophageal reflux disease without esophagitis: Secondary | ICD-10-CM | POA: Insufficient documentation

## 2023-12-09 DIAGNOSIS — R1013 Epigastric pain: Secondary | ICD-10-CM | POA: Diagnosis not present

## 2023-12-09 DIAGNOSIS — Z800 Family history of malignant neoplasm of digestive organs: Secondary | ICD-10-CM

## 2023-12-09 DIAGNOSIS — C49A3 Gastrointestinal stromal tumor of small intestine: Secondary | ICD-10-CM

## 2023-12-09 DIAGNOSIS — K829 Disease of gallbladder, unspecified: Secondary | ICD-10-CM | POA: Diagnosis not present

## 2023-12-09 DIAGNOSIS — R1111 Vomiting without nausea: Secondary | ICD-10-CM | POA: Insufficient documentation

## 2023-12-09 MED ORDER — NA SULFATE-K SULFATE-MG SULF 17.5-3.13-1.6 GM/177ML PO SOLN: 1.0000 | Freq: Once | ORAL | 0 refills | Status: AC

## 2023-12-09 NOTE — Patient Instructions (Addendum)
 Start Pantoprazole  40 mg daily 30-60 minutes before breakfast.   You have been scheduled for a colonoscopy. Please follow written instructions given to you at your visit today.   If you use inhalers (even only as needed), please bring them with you on the day of your procedure.  DO NOT TAKE 7 DAYS PRIOR TO TEST- Trulicity (dulaglutide) Ozempic, Wegovy (semaglutide) Mounjaro (tirzepatide) Bydureon Bcise (exanatide extended release)  DO NOT TAKE 1 DAY PRIOR TO YOUR TEST Rybelsus (semaglutide) Adlyxin (lixisenatide) Victoza (liraglutide) Byetta (exanatide) ________________________________________________________________________

## 2023-12-09 NOTE — Progress Notes (Addendum)
 12/09/2023 Miranda Howe [id number] 05/31/1970   HISTORY OF PRESENT ILLNESS: This is a 53 year old female who is new to our office.  She has a history of a GIST tumor of her small bowel that presented as a small bowel perforation in the jejunum back in 2016.  She underwent laparotomy with resection and subsequently underwent treatment with oncology.  Follows with Dr. Lanny.  She has never had colonoscopy in the past.  More recently for the past few months she has had some epigastric abdominal discomfort.  She also has some acid reflux and occasionally will have random episodes of projectile vomiting.  She has not lost weight, in fact she has gained some weight.  She does have Protonix , but only takes it on occasion.  She has noticed some discoloration in her upper abdomen at the area of the discomfort.  She had a CT scan of the abdomen and pelvis with contrast in June as follows:    IMPRESSION: 1. No acute findings or explanation for the patient's symptoms. 2. No evidence of recurrent or metastatic disease. 3. Cholelithiasis without evidence of cholecystitis or biliary dilatation. 4. Stable mild distal esophageal wall thickening, possibly due to reflux esophagitis.  Father diagnosed with colon cancer at age 47.    Past Medical History:  Diagnosis Date   Anxiety    Family history of colon cancer    gist dx'd 06/07/14   Past Surgical History:  Procedure Laterality Date   BARTHOLIN GLAND CYST EXCISION  01/17/2009   I&D   CERVICAL POLYPECTOMY  2010   LAPAROTOMY N/A 06/07/2014   Procedure: EXPLORATORY LAPAROTOMY RESECTION OF ABDOMINAL MASS AND PORTION OF SMALL BOWEL;  Surgeon: Morene Olives, MD;  Location: [facility];  Service: General;  Laterality: N/A;   WISDOM TOOTH EXTRACTION      reports that she has never smoked. She has never used smokeless tobacco. She reports current alcohol use. She reports that she does not use drugs. family history includes Cancer in her maternal  aunt, paternal grandfather, paternal uncle, paternal uncle, and paternal uncle; Cancer (age of onset: 77) in her mother; Cancer (age of onset: 22) in her father; Diabetes in her mother; Heart disease in her father; Hypertension in her father and mother; Lupus in her cousin; Other in her mother. Allergies  Allergen Reactions   Sulfa Antibiotics Hives      Outpatient Encounter Medications as of 12/09/2023  Medication Sig   citalopram  (CELEXA ) 10 MG tablet Take 10 mg by mouth daily.   Multiple Vitamin (MULTIVITAMIN) tablet Take 1 tablet by mouth daily.   pantoprazole  (PROTONIX ) 40 MG tablet TAKE 1 TABLET BY MOUTH DAILY   No facility-administered encounter medications on file as of 12/09/2023.    REVIEW OF SYSTEMS  : All other systems reviewed and negative except where noted in the History of Present Illness.   PHYSICAL EXAM: Ht 5' 3 (1.6 m)   Wt 191 lb 4 oz (86.8 kg)   LMP 03/09/2018 (Approximate)   BMI 33.88 kg/m  General: Well developed white female in no acute distress Head: Normocephalic and atraumatic Eyes:  Sclerae anicteric, conjunctiva pink. Ears: Normal auditory acuity Lungs: Clear throughout to auscultation; no W/R/R. Heart: Regular rate and rhythm; no M/R/G. Abdomen: Soft, non-distended.  BS present.  Some TTP in the epigastric region.  Also some discoloration, almost bruising seen in that area.     Rectal:  Will be done at the time of colonoscopy. Musculoskeletal: Symmetrical with no gross deformities.  Skin: No lesions on visible extremities Extremities: No edema. Neurological: Alert oriented x 4, grossly non-focal Psychological:  Alert and cooperative. Normal mood and affect  ASSESSMENT AND PLAN: *Epigastric abdominal pain with some reflux symptoms and occasional random episodes of vomiting: Also noticed discoloration of her skin in the area of the discomfort.  This has been persistent and has gotten larger, but not more prominent otherwise.  CT scan in June with  gallstones.  May need to be consider as source of pain if EGD negative. *History of a GIST that presented as a small bowel perforation in 2016.  Had emergent surgery with resection and then underwent treatment with Dr. Lanny. *CRC screening:  Never had a colonoscopy in the past.  Patient scheduled for colonoscopy with Dr. Legrand as well. *Family history of colon cancer in her father, diagnosed at age 60  **The risks, benefits, and alternatives to EGD and colonoscopy were discussed with the patient and she consents to proceed.   CC:  Cleotilde Planas, MD

## 2023-12-09 NOTE — Progress Notes (Signed)
 ____________________________________________________________  Attending physician addendum:  Thank you for sending this case to me. I have reviewed the entire note and agree with the plan.  I do not know the source of that discoloration of the abdominal wall.  However, I recommend that her surgeon take a look at that.  If surgery does not know the cause, then dermatology evaluation would be helpful.   In addition, her CT abdomen and pelvis was not entirely unremarkable.  There were gallstones seen, which are a potential cause of this episodic pain she has been having if no source of pain found on the EGD.  Victory Brand, MD  ____________________________________________________________

## 2023-12-14 ENCOUNTER — Telehealth: Payer: Self-pay | Admitting: *Deleted

## 2023-12-14 DIAGNOSIS — C49A3 Gastrointestinal stromal tumor of small intestine: Secondary | ICD-10-CM

## 2023-12-14 DIAGNOSIS — R1013 Epigastric pain: Secondary | ICD-10-CM

## 2023-12-14 NOTE — Telephone Encounter (Signed)
-----   Message from [place] D. Zehr sent at 12/09/2023  5:32 PM EDT ----- Dr. Legrand looked at the photo of the discoloration on her abdomen.  He is not sure what is causing that either.  He would like her to be seen by surgery again to see what their thoughts are and if they are not sure than possibly dermatology.  She had surgery by Dr. Mikell who has passed away so we will need to see someone else at [facility].  If she needs a referral then can refer for abdominal pain and history of GIST with laparotomy.  Thank you,  Jess

## 2023-12-14 NOTE — Telephone Encounter (Signed)
 Left message for patient to call office.

## 2023-12-15 NOTE — Telephone Encounter (Signed)
 Patient informed and referral placed. Notes faxed to [facility]

## 2023-12-15 NOTE — Telephone Encounter (Signed)
Patient returning call- please advise.  

## 2024-01-05 ENCOUNTER — Telehealth: Payer: Self-pay | Admitting: Hematology

## 2024-01-05 NOTE — Telephone Encounter (Signed)
 Miranda Howe called in to cancel her appointment. I attempted to re-schedule but she stated that she may not have to be seen again after her Colonoscopy. She stated that she will call back to schedule if needed.

## 2024-01-06 ENCOUNTER — Inpatient Hospital Stay: Payer: 59 | Admitting: Hematology

## 2024-01-06 ENCOUNTER — Inpatient Hospital Stay: Payer: 59

## 2024-01-20 ENCOUNTER — Ambulatory Visit: Admitting: Gastroenterology

## 2024-01-20 ENCOUNTER — Encounter: Payer: Self-pay | Admitting: Gastroenterology

## 2024-01-20 VITALS — BP 104/61 | HR 67 | Temp 97.3°F | Resp 16 | Ht 63.0 in | Wt 191.0 lb

## 2024-01-20 DIAGNOSIS — Z1211 Encounter for screening for malignant neoplasm of colon: Secondary | ICD-10-CM

## 2024-01-20 DIAGNOSIS — K2289 Other specified disease of esophagus: Secondary | ICD-10-CM | POA: Diagnosis not present

## 2024-01-20 DIAGNOSIS — R933 Abnormal findings on diagnostic imaging of other parts of digestive tract: Secondary | ICD-10-CM

## 2024-01-20 DIAGNOSIS — K2950 Unspecified chronic gastritis without bleeding: Secondary | ICD-10-CM | POA: Diagnosis not present

## 2024-01-20 DIAGNOSIS — R1013 Epigastric pain: Secondary | ICD-10-CM

## 2024-01-20 DIAGNOSIS — K208 Other esophagitis without bleeding: Secondary | ICD-10-CM | POA: Diagnosis not present

## 2024-01-20 DIAGNOSIS — K2100 Gastro-esophageal reflux disease with esophagitis, without bleeding: Secondary | ICD-10-CM

## 2024-01-20 MED ORDER — SODIUM CHLORIDE 0.9 % IV SOLN: 500.0000 mL | Freq: Once | INTRAVENOUS | Status: DC

## 2024-01-20 NOTE — Progress Notes (Signed)
 Called to room to assist during endoscopic procedure.  Patient ID and intended procedure confirmed with present staff. Received instructions for my participation in the procedure from the performing physician.

## 2024-01-20 NOTE — Op Note (Signed)
 [facility] Patient Name: Miranda Howe Procedure Date: 01/20/2024 2:17 PM MRN: [id number] Endoscopist: Victory L. Legrand , MD, [id number] Age: 53 Referring MD:  Date of Birth: 02-25-1971 Gender: Female Account #: [id number] Procedure:                Colonoscopy Indications:              Screening for colorectal malignant neoplasm, This                            is the patient's first colonoscopy Medicines:                Monitored Anesthesia Care Procedure:                Pre-Anesthesia Assessment:                           - Prior to the procedure, a History and Physical                            was performed, and patient medications and                            allergies were reviewed. The patient's tolerance of                            previous anesthesia was also reviewed. The risks                            and benefits of the procedure and the sedation                            options and risks were discussed with the patient.                            All questions were answered, and informed consent                            was obtained. Prior Anticoagulants: The patient has                            taken no anticoagulant or antiplatelet agents. ASA                            Grade Assessment: II - A patient with mild systemic                            disease. After reviewing the risks and benefits,                            the patient was deemed in satisfactory condition to                            undergo the procedure.  After obtaining informed consent, the colonoscope                            was passed under direct vision. Throughout the                            procedure, the patient's blood pressure, pulse, and                            oxygen saturations were monitored continuously. The                            CF [id number] #[id number] was introduced through the anus                            and advanced to  the the cecum, identified by                            appendiceal orifice and ileocecal valve. The                            colonoscopy was performed without difficulty. The                            patient tolerated the procedure well. The quality                            of the bowel preparation was excellent. The                            ileocecal valve, appendiceal orifice, and rectum                            were photographed. Scope In: 2:31:56 PM Scope Out: 2:43:36 PM Scope Withdrawal Time: 0 hours 8 minutes 13 seconds  Total Procedure Duration: 0 hours 11 minutes 40 seconds  Findings:                 The perianal and digital rectal examinations were                            normal.                           Repeat examination of right colon under NBI                            performed.                           The entire examined colon appeared normal on direct                            and retroflexion views. Complications:            No immediate complications. Estimated Blood Loss:  Estimated blood loss: none. Impression:               - The entire examined colon is normal on direct and                            retroflexion views.                           - No specimens collected. Recommendation:           - Patient has a contact number available for                            emergencies. The signs and symptoms of potential                            delayed complications were discussed with the                            patient. Return to normal activities tomorrow.                            Written discharge instructions were provided to the                            patient.                           - Resume previous diet.                           - Continue present medications.                           - Repeat colonoscopy in 10 years for screening                            purposes.                           - See the other procedure note  for documentation of                            additional recommendations. Tywan Siever L. Legrand, MD 01/20/2024 2:47:09 PM This report has been signed electronically.

## 2024-01-20 NOTE — Op Note (Signed)
  [facility] Patient Name: Miranda Howe Procedure Date: 01/20/2024 2:17 PM MRN: [id number] Endoscopist: Victory L. Legrand , MD, [id number] Age: 53 Referring MD:  Date of Birth: 09/19/1970 Gender: Female Account #: [id number] Procedure:                Upper GI endoscopy Indications:              Epigastric abdominal pain                           several months, upper and toward left side, not                            meal-related, last episode about a month ago                           intermittent GERD symptoms                           abnormal GI imaging - CTAP suggesting distal                            esophageal wall thickening Medicines:                Monitored Anesthesia Care Procedure:                Pre-Anesthesia Assessment:                           - Prior to the procedure, a History and Physical                            was performed, and patient medications and                            allergies were reviewed. The patient's tolerance of                            previous anesthesia was also reviewed. The risks                            and benefits of the procedure and the sedation                            options and risks were discussed with the patient.                            All questions were answered, and informed consent                            was obtained. Prior Anticoagulants: The patient has                            taken no anticoagulant or antiplatelet agents. ASA  Grade Assessment: II - A patient with mild systemic                            disease. After reviewing the risks and benefits,                            the patient was deemed in satisfactory condition to                            undergo the procedure.                           After obtaining informed consent, the endoscope was                            passed under direct vision. Throughout the                             procedure, the patient's blood pressure, pulse, and                            oxygen saturations were monitored continuously. The                            Olympus Scope [id number] was introduced through the                            mouth, and advanced to the second part of duodenum.                            The upper GI endoscopy was accomplished without                            difficulty. The patient tolerated the procedure                            well. Scope In: Scope Out: Findings:                 LA Grade A (one or more mucosal breaks less than 5                            mm, not extending between tops of 2 mucosal folds)                            esophagitis was found at the gastroesophageal                            junction.                           A small probable inlet patch was seen just distal                            to the  UES (15-17cm from incisors). There was a                            benign-appearing slightly raised central portion of                            this tissue. Biopsies were taken with a cold                            forceps for histology.                           Normal mucosa was found in the entire examined                            stomach. Biopsies were taken with a cold forceps                            for histology. (antrum and body in same path jar to                            r/o H pylori)                           The cardia and gastric fundus were normal on                            retroflexion.                           The examined duodenum was normal. Complications:            No immediate complications. Estimated Blood Loss:     Estimated blood loss was minimal. Impression:               - LA Grade A reflux esophagitis.                           - Normal mucosa was found in the entire stomach.                            Biopsied.                           - Normal examined duodenum.                           -  Probable incidental inlet patch in the proximal                            esophagus - biopsied. Recommendation:           - Patient has a contact number available for                            emergencies. The signs and symptoms of potential  delayed complications were discussed with the                            patient. Return to normal activities tomorrow.                            Written discharge instructions were provided to the                            patient.                           - Resume previous diet.                           - Continue present medications. (pantoprazole  can                            be taken as needed for reflux symptoms) Diet and                            lifestyle anti-reflux measures                           - Await pathology results.                           - See the other procedure note for documentation of                            additional recommendations.                           - Discussed pain with patient before the procedure.                            If recurs and certainly if escalating and happening                            post-prandial, contact us  for referral to surgery                            for consideration of cholecystectomy. Jadon Harbaugh L. Legrand, MD 01/20/2024 3:10:50 PM This report has been signed electronically.

## 2024-01-20 NOTE — Progress Notes (Signed)
 Placed #6 nasal airway in R nare.  No bleeding.  Pt with OSA.

## 2024-01-20 NOTE — Progress Notes (Signed)
 Pt's states no medical or surgical changes since previsit or office visit.

## 2024-01-20 NOTE — Progress Notes (Signed)
 Vss nad trans to pacu

## 2024-01-20 NOTE — Progress Notes (Signed)
 History and Physical:  This patient presents for endoscopic testing for: Encounter Diagnoses  Name Primary?   Special screening for malignant neoplasms, colon Yes   Abdominal pain, epigastric     53 year old woman seen in the office last month for epigastric abdominal pain and family history of colorectal cancer in her grandfather.  Her father had prostate cancer.  Gallstones seen on CT abdomen and pelvis earlier this year.  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  Other clinical details in the office note from 12/09/2023.   The upper abdominal pain that has occurred intermittently for the last several months is typically in the upper abdomen and more toward the left side.  She has not noticed any clear triggers or relieving factors.  It does not typically come on with meals.  There have been a couple episodes where it occurred overnight and was associated with vomiting. Rosealyn also notes that this abdominal pain has not occurred for the last month.  CTAP also showed distal esophageal wall thickening  Patient is otherwise without complaints or active issues today.   Past Medical History: Past Medical History:  Diagnosis Date   Anxiety    Family history of colon cancer    gist dx'd 06/07/14   Obesity      Past Surgical History: Past Surgical History:  Procedure Laterality Date   BARTHOLIN GLAND CYST EXCISION  01/17/2009   I&D   CERVICAL POLYPECTOMY  2010   LAPAROTOMY N/A 06/07/2014   Procedure: EXPLORATORY LAPAROTOMY RESECTION OF ABDOMINAL MASS AND PORTION OF SMALL BOWEL;  Surgeon: Morene Olives, MD;  Location: [facility];  Service: General;  Laterality: N/A;   WISDOM TOOTH EXTRACTION      Allergies: Allergies  Allergen Reactions   Sulfa Antibiotics Hives    Outpatient Meds: Current Outpatient Medications  Medication Sig Dispense Refill   citalopram  (CELEXA ) 10 MG tablet Take 10 mg by mouth daily.     Multiple Vitamin (MULTIVITAMIN)  tablet Take 1 tablet by mouth daily.     pantoprazole  (PROTONIX ) 40 MG tablet TAKE 1 TABLET BY MOUTH DAILY 30 tablet 2   Current Facility-Administered Medications  Medication Dose Route Frequency Provider Last Rate Last Admin   0.9 %  sodium chloride  infusion  500 mL Intravenous Once Danis, Quay Simkin L III, MD          ___________________________________________________________________ Objective   Exam:  BP 121/87   Pulse 78   Temp (!) 97.3 F (36.3 C)   Ht 5' 3 (1.6 m)   Wt 191 lb (86.6 kg)   LMP 03/09/2018 (Approximate)   SpO2 97%   BMI 33.83 kg/m   CV: regular , S1/S2 Resp: clear to auscultation bilaterally, normal RR and effort noted GI: soft, no tenderness, with active bowel sounds.  Mild skin discoloration on the superior aspect of her midline incision.   Assessment: Encounter Diagnoses  Name Primary?   Special screening for malignant neoplasms, colon Yes   Abdominal pain, epigastric     The upper abdominal pain is somewhat difficult to characterize, and it does not entirely typical for biliary colic.  I recommended that she monitor this and let us  know if it is escalating and/or occurring with meals.  If so, then a surgical consult would be warranted.  Naturally, if she has a severe episode that is unrelenting and is associated with fever or vomiting or upper abdominal tenderness, then she should seek urgent care for the possibility of acute cholecystitis.  Plan: Colonoscopy EGD  The benefits and risks of the planned procedure(s) were described in detail with the patient or (when appropriate) their health care proxy.  Risks were outlined as including, but not limited to, bleeding, infection, perforation, adverse medication reaction leading to cardiac or pulmonary decompensation, pancreatitis (if ERCP).  The limitation of incomplete mucosal visualization was also discussed.  No guarantees or warranties were given.  The patient is appropriate for an endoscopic  procedure in the ambulatory setting.   - Victory Brand, MD

## 2024-01-20 NOTE — Patient Instructions (Addendum)
Await pathology results.  YOU HAD AN ENDOSCOPIC PROCEDURE TODAY AT THE [facility]:   Refer to the procedure report that was given to you for any specific questions about what was found during the examination.  If the procedure report does not answer your questions, please call your gastroenterologist to clarify.  If you requested that your care partner not be given the details of your procedure findings, then the procedure report has been included in a sealed envelope for you to review at your convenience later.  YOU SHOULD EXPECT: Some feelings of bloating in the abdomen. Passage of more gas than usual.  Walking can help get rid of the air that was put into your GI tract during the procedure and reduce the bloating. If you had a lower endoscopy (such as a colonoscopy or flexible sigmoidoscopy) you may notice spotting of blood in your stool or on the toilet paper. If you underwent a bowel prep for your procedure, you may not have a normal bowel movement for a few days.  Please Note:  You might notice some irritation and congestion in your nose or some drainage.  This is from the oxygen used during your procedure.  There is no need for concern and it should clear up in a day or so.  SYMPTOMS TO REPORT IMMEDIATELY:  Following lower endoscopy (colonoscopy or flexible sigmoidoscopy):  Excessive amounts of blood in the stool  Significant tenderness or worsening of abdominal pains  Swelling of the abdomen that is new, acute  Fever of 100F or higher  Following upper endoscopy (EGD)  Vomiting of blood or coffee ground material  New chest pain or pain under the shoulder blades  Painful or persistently difficult swallowing  New shortness of breath  Fever of 100F or higher  Black, tarry-looking stools  For urgent or emergent issues, a gastroenterologist can be reached at any hour by calling [phone]. Do not use MyChart messaging for urgent concerns.    DIET:  We do recommend a  small meal at first, but then you may proceed to your regular diet.  Drink plenty of fluids but you should avoid alcoholic beverages for 24 hours.  ACTIVITY:  You should plan to take it easy for the rest of today and you should NOT DRIVE or use heavy machinery until tomorrow (because of the sedation medicines used during the test).    FOLLOW UP: Our staff will call the number listed on your records the next business day following your procedure.  We will call around 7:15- 8:00 am to check on you and address any questions or concerns that you may have regarding the information given to you following your procedure. If we do not reach you, we will leave a message.     If any biopsies were taken you will be contacted by phone or by letter within the next 1-3 weeks.  Please call us at [phone] if you have not heard about the biopsies in 3 weeks.    SIGNATURES/CONFIDENTIALITY: You and/or your care partner have signed paperwork which will be entered into your electronic medical record.  These signatures attest to the fact that that the information above on your After Visit Summary has been reviewed and is understood.  Full responsibility of the confidentiality of this discharge information lies with you and/or your care-partner.  

## 2024-01-21 ENCOUNTER — Telehealth: Payer: Self-pay

## 2024-01-21 NOTE — Telephone Encounter (Signed)
 No answer on follow up call - voice mail message left

## 2024-01-25 LAB — SURGICAL PATHOLOGY

## 2024-01-26 ENCOUNTER — Ambulatory Visit: Payer: Self-pay | Admitting: Gastroenterology

## 2024-02-08 ENCOUNTER — Other Ambulatory Visit: Payer: Self-pay | Admitting: Nurse Practitioner
